# Patient Record
Sex: Female | Born: 1955 | Race: Black or African American | Hispanic: No | Smoking: Never smoker
Health system: Southern US, Community
[De-identification: ages and names within clinical notes are randomized; demographics above are authoritative.]

## PROBLEM LIST (undated history)

## (undated) DIAGNOSIS — I10 Essential (primary) hypertension: Secondary | ICD-10-CM

---

## 1979-05-12 HISTORY — PX: ABDOMINAL HYSTERECTOMY: SHX81

## 2014-11-19 ENCOUNTER — Inpatient Hospital Stay (HOSPITAL_COMMUNITY): Payer: Self-pay

## 2014-11-19 ENCOUNTER — Inpatient Hospital Stay (HOSPITAL_COMMUNITY)
Admission: AD | Admit: 2014-11-19 | Discharge: 2014-11-26 | DRG: 331 | Disposition: A | Discharge status: Home / Self Care | Payer: Self-pay | Source: Ambulatory Visit | Attending: Surgery | Admitting: Surgery

## 2014-11-19 ENCOUNTER — Encounter (HOSPITAL_COMMUNITY): Payer: Self-pay | Admitting: Radiology

## 2014-11-19 DIAGNOSIS — I10 Essential (primary) hypertension: Secondary | ICD-10-CM | POA: Diagnosis present

## 2014-11-19 DIAGNOSIS — K922 Gastrointestinal hemorrhage, unspecified: Secondary | ICD-10-CM | POA: Diagnosis present

## 2014-11-19 DIAGNOSIS — R05 Cough: Secondary | ICD-10-CM

## 2014-11-19 DIAGNOSIS — D125 Benign neoplasm of sigmoid colon: Principal | ICD-10-CM | POA: Diagnosis present

## 2014-11-19 DIAGNOSIS — R58 Hemorrhage, not elsewhere classified: Secondary | ICD-10-CM

## 2014-11-19 DIAGNOSIS — K6389 Other specified diseases of intestine: Secondary | ICD-10-CM

## 2014-11-19 DIAGNOSIS — Z880 Allergy status to penicillin: Secondary | ICD-10-CM

## 2014-11-19 DIAGNOSIS — R059 Cough, unspecified: Secondary | ICD-10-CM

## 2014-11-19 DIAGNOSIS — Z90710 Acquired absence of both cervix and uterus: Secondary | ICD-10-CM

## 2014-11-19 LAB — COMPREHENSIVE METABOLIC PANEL
ALT: 23 U/L (ref 0–35)
AST: 30 U/L (ref 0–37)
Albumin: 4.1 g/dL (ref 3.5–5.2)
Alkaline Phosphatase: 86 U/L (ref 39–117)
Anion gap: 9 (ref 5–15)
BILIRUBIN TOTAL: 0.6 mg/dL (ref 0.3–1.2)
BUN: 6 mg/dL (ref 6–23)
CO2: 29 mmol/L (ref 19–32)
Calcium: 9.8 mg/dL (ref 8.4–10.5)
Chloride: 102 mmol/L (ref 96–112)
Creatinine, Ser: 0.95 mg/dL (ref 0.50–1.10)
GFR calc Af Amer: 74 mL/min — ABNORMAL LOW (ref >90)
GFR, EST NON AFRICAN AMERICAN: 64 mL/min — AB (ref >90)
Glucose, Bld: 130 mg/dL — ABNORMAL HIGH (ref 70–99)
POTASSIUM: 3.3 mmol/L — AB (ref 3.5–5.1)
SODIUM: 140 mmol/L (ref 135–145)
Total Protein: 7.1 g/dL (ref 6.0–8.3)

## 2014-11-19 LAB — CBC WITH DIFFERENTIAL/PLATELET
BASOS ABS: 0 10*3/uL (ref 0.0–0.1)
BASOS PCT: 1 % (ref 0–1)
Eosinophils Absolute: 0.1 10*3/uL (ref 0.0–0.7)
Eosinophils Relative: 2 % (ref 0–5)
HCT: 39.2 % (ref 36.0–46.0)
HEMOGLOBIN: 12.9 g/dL (ref 12.0–15.0)
LYMPHS PCT: 53 % — AB (ref 12–46)
Lymphs Abs: 2.6 10*3/uL (ref 0.7–4.0)
MCH: 26.7 pg (ref 26.0–34.0)
MCHC: 32.9 g/dL (ref 30.0–36.0)
MCV: 81.2 fL (ref 78.0–100.0)
Monocytes Absolute: 0.3 10*3/uL (ref 0.1–1.0)
Monocytes Relative: 7 % (ref 3–12)
NEUTROS ABS: 1.8 10*3/uL (ref 1.7–7.7)
Neutrophils Relative %: 37 % — ABNORMAL LOW (ref 43–77)
PLATELETS: 234 10*3/uL (ref 150–400)
RBC: 4.83 MIL/uL (ref 3.87–5.11)
RDW: 13.6 % (ref 11.5–15.5)
WBC: 4.9 10*3/uL (ref 4.0–10.5)

## 2014-11-19 LAB — URINALYSIS, ROUTINE W REFLEX MICROSCOPIC
BILIRUBIN URINE: NEGATIVE
Glucose, UA: NEGATIVE mg/dL
Hgb urine dipstick: NEGATIVE
KETONES UR: NEGATIVE mg/dL
Leukocytes, UA: NEGATIVE
Nitrite: NEGATIVE
Protein, ur: NEGATIVE mg/dL
SPECIFIC GRAVITY, URINE: 1.003 — AB (ref 1.005–1.030)
Urobilinogen, UA: 0.2 mg/dL (ref 0.0–1.0)
pH: 7 (ref 5.0–8.0)

## 2014-11-19 LAB — PROTIME-INR
INR: 1.05 (ref 0.00–1.49)
PROTHROMBIN TIME: 13.8 s (ref 11.6–15.2)

## 2014-11-19 MED ORDER — HYDRALAZINE HCL 20 MG/ML IJ SOLN: 10.0000 mg | Freq: Four times a day (QID) | INTRAMUSCULAR | Status: DC | PRN

## 2014-11-19 MED ORDER — SODIUM CHLORIDE 0.9 % IV SOLN
INTRAVENOUS | Status: DC
Start: 2014-11-19 — End: 2014-11-22
  Administered 2014-11-19 – 2014-11-22 (×4): via INTRAVENOUS

## 2014-11-19 MED ORDER — ALPRAZOLAM 0.5 MG PO TABS
0.5000 mg | ORAL_TABLET | Freq: Two times a day (BID) | ORAL | Status: AC | PRN
  Administered 2014-11-19 – 2014-11-20 (×2): 0.5 mg via ORAL
  Filled 2014-11-19 (×2): qty 1

## 2014-11-19 MED ORDER — ACETAMINOPHEN 325 MG PO TABS: 650.0000 mg | ORAL_TABLET | Freq: Four times a day (QID) | ORAL | Status: DC | PRN

## 2014-11-19 MED ORDER — ACETAMINOPHEN 650 MG RE SUPP: 650.0000 mg | Freq: Four times a day (QID) | RECTAL | Status: DC | PRN

## 2014-11-19 MED ORDER — ONDANSETRON HCL 4 MG PO TABS: 4.0000 mg | ORAL_TABLET | Freq: Four times a day (QID) | ORAL | Status: DC | PRN

## 2014-11-19 MED ORDER — IOHEXOL 300 MG/ML  SOLN
100.0000 mL | Freq: Once | INTRAMUSCULAR | Status: AC | PRN
  Administered 2014-11-19: 100 mL via INTRAVENOUS

## 2014-11-19 MED ORDER — ONDANSETRON HCL 4 MG/2ML IJ SOLN
4.0000 mg | Freq: Four times a day (QID) | INTRAMUSCULAR | Status: DC | PRN
Start: 2014-11-19 — End: 2014-11-23

## 2014-11-19 NOTE — H&P (Signed)
[facility] History and Physical  Karina White NWG:[id number] DOB: [birth year] DOA: 11/19/2014  Referring physician: Dr.Mann PCP: No primary care provider on file.   Chief Complaint: Direct Admission from Dr.Mann's office  HPI: Karina White is a 59 y.o. female with PMH of Hypertension was diagnosed with a sigmoid mass (Tubulovillous adenoma) in February by Dr.Mann on colonoscopy. Dr.Mann was trying to get her a FU with general Surgery in follow up. Today patient noticed bright red blood per rectum followed by weakness and dizziness. She went to see Dr.Mann and was then referred to the Hospital as a Direct Admission under [facility] service. Patient denies any further episodes of bleeding today. No abd pain, no N/V. No weight loss    Review of Systems: positives bolded Constitutional: No weight loss, night sweats, Fevers, chills, fatigue.  HEENT:  No headaches, Difficulty swallowing,Tooth/dental problems,Sore throat,  No sneezing, itching, ear ache, nasal congestion, post nasal drip,  Cardio-vascular:  No chest pain, Orthopnea, PND, swelling in lower extremities, anasarca, dizziness, palpitations  GI:  No heartburn, indigestion, abdominal pain, nausea, vomiting, diarrhea, change in bowel habits, loss of appetite  Resp:  No shortness of breath with exertion or at rest. No excess mucus, no productive cough, No non-productive cough, No coughing up of blood.No change in color of mucus.No wheezing.No chest wall deformity  Skin:  no rash or lesions.  GU:  no dysuria, change in color of urine, no urgency or frequency. No flank pain.  Musculoskeletal:  No joint pain or swelling. No decreased range of motion. No back pain.  Psych:  No change in mood or affect. No depression or anxiety. No memory loss.   Past medical history -Hypertension  No past surgical history on file.   Social History:  Married, unemployed, has no tobacco, alcohol, and drug history on  file.  Allergies not on file   family history  -mother deceased due to blood clots, father deceased from old age  Prior to Admission medications   Not on File   Physical Exam: Filed Vitals:   11/19/14 1817  BP: 180/81  Pulse: 75  Temp: 98 F (36.7 C)  TempSrc: Oral  Resp: 18  SpO2: 100%    Wt Readings from Last 3 Encounters:  No data found for Wt    General:  Appears calm and comfortable, AAOx3, no distress Eyes: PERRL, normal lids, irises & conjunctiva ENT: grossly normal hearing, lips & tongue Neck: no LAD, masses or thyromegaly Cardiovascular: RRR, no m/r/g. No LE edema. Telemetry: SR, no arrhythmias  Respiratory: CTA bilaterally, no w/r/r. Normal respiratory effort. Abdomen: soft, ntnd Skin: no rash or induration seen on limited exam Musculoskeletal: grossly normal tone BUE/BLE Psychiatric: grossly normal mood and affect, speech fluent and appropriate Neurologic: grossly non-focal.          Labs on Admission:  Basic Metabolic Panel: No results for input(s): NA, K, CL, CO2, GLUCOSE, BUN, CREATININE, CALCIUM, MG, PHOS in the last 168 hours. Liver Function Tests: No results for input(s): AST, ALT, ALKPHOS, BILITOT, PROT, ALBUMIN in the last 168 hours. No results for input(s): LIPASE, AMYLASE in the last 168 hours. No results for input(s): AMMONIA in the last 168 hours. CBC: No results for input(s): WBC, NEUTROABS, HGB, HCT, MCV, PLT in the last 168 hours. Cardiac Enzymes: No results for input(s): CKTOTAL, CKMB, CKMBINDEX, TROPONINI in the last 168 hours.  BNP (last 3 results) No results for input(s): BNP in the last 8760 hours.  ProBNP (last 3 results) No results for  input(s): PROBNP in the last 8760 hours.  CBG: No results for input(s): GLUCAP in the last 168 hours.  Radiological Exams on Admission: No results found.   Assessment/Plan:  1. 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 HTN -hold HCTZ, hydralazine PRN for now  DVt proph: SCDs  Code Status: FUll Code Family Communication:none at bedside Disposition Plan: inpatient  Time spent:  [facility] Pager [phone]

## 2014-11-19 NOTE — Progress Notes (Signed)
Pt admitted to the unit at 1811. Pt mental status is alert and oriented. Pt oriented to room, staff, and call bell. Skin is intact. Full assessment charted in [facility]. Call bell within reach. Visitor guidelines reviewed w/ pt and/or family.

## 2014-11-20 ENCOUNTER — Encounter (HOSPITAL_COMMUNITY): Payer: Self-pay | Admitting: *Deleted

## 2014-11-20 DIAGNOSIS — K922 Gastrointestinal hemorrhage, unspecified: Secondary | ICD-10-CM

## 2014-11-20 LAB — PROTIME-INR
INR: 1.06 (ref 0.00–1.49)
Prothrombin Time: 13.9 seconds (ref 11.6–15.2)

## 2014-11-20 LAB — BASIC METABOLIC PANEL
Anion gap: 9 (ref 5–15)
CHLORIDE: 104 mmol/L (ref 96–112)
CO2: 29 mmol/L (ref 19–32)
CREATININE: 0.86 mg/dL (ref 0.50–1.10)
Calcium: 9.4 mg/dL (ref 8.4–10.5)
GFR calc Af Amer: 83 mL/min — ABNORMAL LOW (ref >90)
GFR, EST NON AFRICAN AMERICAN: 72 mL/min — AB (ref >90)
Glucose, Bld: 99 mg/dL (ref 70–99)
Potassium: 3.6 mmol/L (ref 3.5–5.1)
SODIUM: 142 mmol/L (ref 135–145)

## 2014-11-20 LAB — CBC
HEMATOCRIT: 39.1 % (ref 36.0–46.0)
Hemoglobin: 12.9 g/dL (ref 12.0–15.0)
MCH: 26.8 pg (ref 26.0–34.0)
MCHC: 33 g/dL (ref 30.0–36.0)
MCV: 81.3 fL (ref 78.0–100.0)
Platelets: 241 10*3/uL (ref 150–400)
RBC: 4.81 MIL/uL (ref 3.87–5.11)
RDW: 13.6 % (ref 11.5–15.5)
WBC: 4.6 10*3/uL (ref 4.0–10.5)

## 2014-11-20 MED ORDER — POTASSIUM CHLORIDE CRYS ER 20 MEQ PO TBCR
30.0000 meq | EXTENDED_RELEASE_TABLET | Freq: Once | ORAL | Status: AC
  Administered 2014-11-20: 30 meq via ORAL
  Filled 2014-11-20: qty 1

## 2014-11-20 MED ORDER — ALPRAZOLAM 0.5 MG PO TABS
0.5000 mg | ORAL_TABLET | Freq: Two times a day (BID) | ORAL | Status: DC | PRN
  Administered 2014-11-20 – 2014-11-22 (×4): 0.5 mg via ORAL
  Filled 2014-11-20 (×4): qty 1

## 2014-11-20 NOTE — Progress Notes (Signed)
Patient Demographics  Karina White, is a 59 y.o. female, DOB - 06/24/1956, ZOX:[id number]  Admit date - 11/19/2014   Admitting Physician Joseph Art, DO  Outpatient Primary MD for the patient is No primary care provider on file.  LOS - 1   No chief complaint on file.       Subjective:   Karina White today has, No headache, No chest pain, No abdominal pain - No Nausea, No new weakness tingling or numbness, No Cough - SOB.    Assessment & Plan    1. Lower GI bleed with recent history of colonic mass biosy showing tubulu villous adenoma - H&H stable, no further episode of GI bleed, Gen. Surgery seen. Likely will require hemicolectomy this admission.   2. Essential hypertension. As needed IV hydralazine.     Code Status: full  Family Communication: none  Disposition Plan: home   Procedures   CT scan abdomen and pelvis.   Consults  CCS   Medications  Scheduled Meds:  Continuous Infusions: . sodium chloride 75 mL/hr at 11/19/14 1938   PRN Meds:.acetaminophen **OR** acetaminophen, hydrALAZINE, ondansetron **OR** ondansetron (ZOFRAN) IV  DVT Prophylaxis   SCDs   Lab Results  Component Value Date   PLT 241 11/20/2014    Antibiotic   Anti-infectives    None          Objective:   Filed Vitals:   11/19/14 1817 11/19/14 2052 11/20/14 0533  BP: 180/81 175/93 154/81  Pulse: 75 67 84  Temp: 98 F (36.7 C) 98 F (36.7 C) 98.1 F (36.7 C)  TempSrc: Oral Oral Oral  Resp: 18 18 18   Height:   5\' 7"  (1.702 m)  Weight:   92 kg (202 lb 13.2 oz)  SpO2: 100% 100% 100%    Wt Readings from Last 3 Encounters:  11/20/14 92 kg (202 lb 13.2 oz)     Intake/Output Summary (Last 24 hours) at 11/20/14 1034 Last data filed at 11/19/14 2350  Gross per 24 hour    Intake    700 ml  Output    300 ml  Net    400 ml     Physical Exam  Awake Alert, Oriented X 3, No new F.N deficits, Normal affect [place].AT,PERRAL Supple Neck,No JVD, No cervical lymphadenopathy appriciated.  Symmetrical Chest wall movement, Good air movement bilaterally, CTAB RRR,No Gallops,Rubs or new Murmurs, No Parasternal Heave +ve B.Sounds, Abd Soft, No tenderness, No organomegaly appriciated, No rebound - guarding or rigidity. No Cyanosis, Clubbing or edema, No new Rash or bruise      Data Review   Micro Results No results found for this or any previous visit (from the past 240 hour(s)).  Radiology Reports Dg Chest 2 View  11/19/2014   CLINICAL DATA:  Rectal bleeding and weakness for 2 days, cough  EXAM: CHEST  2 VIEW  COMPARISON:  None  FINDINGS: Normal heart size and pulmonary vascularity.  Calcified tortuous thoracic aorta.  Lungs clear.  No pleural effusion or pneumothorax.  Osseous structures unremarkable.  IMPRESSION: No acute abnormalities.   Electronically Signed   By: Ulyses Southward M.D.   On: 11/19/2014 20:39   Ct Abdomen Pelvis W Contrast  11/20/2014   CLINICAL DATA:  Rectal  bleeding, dizziness and weakness.  EXAM: CT ABDOMEN AND PELVIS WITH CONTRAST  TECHNIQUE: Multidetector CT imaging of the abdomen and pelvis was performed using the standard protocol following bolus administration of intravenous contrast.  CONTRAST:  OMNIPAQUE IOHEXOL 300 MG/ML  SOLN  COMPARISON:  None.  FINDINGS: Lower chest: The lung bases appear clear. No pleural or pericardial effusion  Hepatobiliary: Small hyperdense structure along the dome of liver measures 4 mm, image 8/series 201. No additional liver abnormalities. The gallbladder is normal. No biliary dilatation.  Pancreas: Negative  Spleen: Negative  Adrenals/Urinary Tract: Normal adrenal glands. The right kidney appears normal. There is a cyst within the left kidney. Urinary bladder appears within normal limits.  Stomach/Bowel: Small  hiatal hernia noted. The small bowel loops have a normal course and caliber. The appendix is visualized and appears normal. No obstruction. Normal appearance of the colon. Sigmoid colon mass is difficult to visualize within the un-prepped and on opacified colon.  Vascular/Lymphatic: Calcified atherosclerotic disease involves the abdominal aorta. No aneurysm. No enlarged retroperitoneal or mesenteric adenopathy. No enlarged pelvic or inguinal lymph nodes.  Reproductive: Previous hysterectomy.  No adnexal mass.  Other: There is no ascites or focal fluid collections within the abdomen or pelvis. Small periumbilical hernia is identified which contains fat only, image 49/series 201.  Musculoskeletal: Unremarkable.  IMPRESSION: 1. No acute findings within the abdomen or pelvis. The appendix is visualized and appears normal. 2. Small hiatal hernia. 3. Small fat containing periumbilical hernia.   Electronically Signed   By: Signa Kell M.D.   On: 11/20/2014 08:07     CBC  Recent Labs Lab 11/19/14 1918 11/20/14 0516  WBC 4.9 4.6  HGB 12.9 12.9  HCT 39.2 39.1  PLT 234 241  MCV 81.2 81.3  MCH 26.7 26.8  MCHC 32.9 33.0  RDW 13.6 13.6  LYMPHSABS 2.6  --   MONOABS 0.3  --   EOSABS 0.1  --   BASOSABS 0.0  --     Chemistries   Recent Labs Lab 11/19/14 1918 11/20/14 0516  NA 140 142  K 3.3* 3.6  CL 102 104  CO2 29 29  GLUCOSE 130* 99  BUN 6 <5*  CREATININE 0.95 0.86  CALCIUM 9.8 9.4  AST 30  --   ALT 23  --   ALKPHOS 86  --   BILITOT 0.6  --    ------------------------------------------------------------------------------------------------------------------ estimated creatinine clearance is 81 mL/min (by C-G formula based on Cr of 0.86). ------------------------------------------------------------------------------------------------------------------ No results for input(s): HGBA1C in the last 72  hours. ------------------------------------------------------------------------------------------------------------------ No results for input(s): CHOL, HDL, LDLCALC, TRIG, CHOLHDL, LDLDIRECT in the last 72 hours. ------------------------------------------------------------------------------------------------------------------ No results for input(s): TSH, T4TOTAL, T3FREE, THYROIDAB in the last 72 hours.  Invalid input(s): FREET3 ------------------------------------------------------------------------------------------------------------------ No results for input(s): VITAMINB12, FOLATE, FERRITIN, TIBC, IRON, RETICCTPCT in the last 72 hours.  Coagulation profile  Recent Labs Lab 11/19/14 1918  INR 1.05    No results for input(s): DDIMER in the last 72 hours.  Cardiac Enzymes No results for input(s): CKMB, TROPONINI, MYOGLOBIN in the last 168 hours.  Invalid input(s): CK ------------------------------------------------------------------------------------------------------------------ Invalid input(s): POCBNP     Time Spent in minutes   35   Susa Raring K M.D on 11/20/2014 at 10:34 AM  Between 7am to 7pm - Pager - [phone]  After 7pm go to [email or web address] - password [facility]  [facility]   Office  [phone]

## 2014-11-20 NOTE — Progress Notes (Signed)
Patient ID: Karina White, female   DOB: 1956/07/08, 59 y.o.   MRN: [id number]

## 2014-11-20 NOTE — Consult Note (Signed)
Reason for Consult:Sigmoid colon mass Referring Physician: Myan Locatelli is an 59 y.o. female.  HPI: Karina White is a patient of Dr. Meriel Pica. She underwent colonoscopy on 11/03/14 and was found to have a sigmoid colon mass at 25 cm. Biopsies were taken and reportedly these showed tubulovillous adenoma. The colonoscopy report is not available, however, the patient's family had some pictures that Dr. Collene Mares printed out for them. I have put this into Epic. It is not clear if the mass was tattooed. The patient passed some blood per rectum yesterday. She also had some right-sided abdominal pain, and nausea. She was admitted to the medical service for lower GI bleeding. She has had no bowel movement since admission. Hemoglobin has been 12.9-12.9. She reports mild nausea but that the abdominal discomfort has resolved.  History reviewed. No pertinent past medical history.  Past surgical history: Umbilical hernia repair, abdominal hysterectomy via Pfannenstiel  History reviewed. No pertinent family history.  Social History:  reports that she has never smoked. She does not have any smokeless tobacco history on file. Her alcohol and drug histories are not on file.  Allergies:  Allergies  Allergen Reactions  . Penicillins Hives and Itching    Medications:  Prior to Admission:  No prescriptions prior to admission    Results for orders placed or performed during the hospital encounter of 11/19/14 (from the past 48 hour(s))  CBC with Differential/Platelet     Status: Abnormal   Collection Time: 11/19/14  7:18 PM  Result Value Ref Range   WBC 4.9 4.0 - 10.5 K/uL   RBC 4.83 3.87 - 5.11 MIL/uL   Hemoglobin 12.9 12.0 - 15.0 g/dL   HCT 39.2 36.0 - 46.0 %   MCV 81.2 78.0 - 100.0 fL   MCH 26.7 26.0 - 34.0 pg   MCHC 32.9 30.0 - 36.0 g/dL   RDW 13.6 11.5 - 15.5 %   Platelets 234 150 - 400 K/uL   Neutrophils Relative % 37 (L) 43 - 77 %   Neutro Abs 1.8 1.7 - 7.7 K/uL   Lymphocytes  Relative 53 (H) 12 - 46 %   Lymphs Abs 2.6 0.7 - 4.0 K/uL   Monocytes Relative 7 3 - 12 %   Monocytes Absolute 0.3 0.1 - 1.0 K/uL   Eosinophils Relative 2 0 - 5 %   Eosinophils Absolute 0.1 0.0 - 0.7 K/uL   Basophils Relative 1 0 - 1 %   Basophils Absolute 0.0 0.0 - 0.1 K/uL  Comprehensive metabolic panel     Status: Abnormal   Collection Time: 11/19/14  7:18 PM  Result Value Ref Range   Sodium 140 135 - 145 mmol/L   Potassium 3.3 (L) 3.5 - 5.1 mmol/L   Chloride 102 96 - 112 mmol/L   CO2 29 19 - 32 mmol/L   Glucose, Bld 130 (H) 70 - 99 mg/dL   BUN 6 6 - 23 mg/dL   Creatinine, Ser 0.95 0.50 - 1.10 mg/dL   Calcium 9.8 8.4 - 10.5 mg/dL   Total Protein 7.1 6.0 - 8.3 g/dL   Albumin 4.1 3.5 - 5.2 g/dL   AST 30 0 - 37 U/L   ALT 23 0 - 35 U/L   Alkaline Phosphatase 86 39 - 117 U/L   Total Bilirubin 0.6 0.3 - 1.2 mg/dL   GFR calc non Af Amer 64 (L) >90 mL/min   GFR calc Af Amer 74 (L) >90 mL/min    Comment: (NOTE) The eGFR  has been calculated using the CKD EPI equation. This calculation has not been validated in all clinical situations. eGFR's persistently <90 mL/min signify possible Chronic Kidney Disease.    Anion gap 9 5 - 15  Protime-INR     Status: None   Collection Time: 11/19/14  7:18 PM  Result Value Ref Range   Prothrombin Time 13.8 11.6 - 15.2 seconds   INR 1.05 0.00 - 1.49  Urinalysis, Routine w reflex microscopic     Status: Abnormal   Collection Time: 11/19/14 11:11 PM  Result Value Ref Range   Color, Urine YELLOW YELLOW   APPearance CLEAR CLEAR   Specific Gravity, Urine 1.003 (L) 1.005 - 1.030   pH 7.0 5.0 - 8.0   Glucose, UA NEGATIVE NEGATIVE mg/dL   Hgb urine dipstick NEGATIVE NEGATIVE   Bilirubin Urine NEGATIVE NEGATIVE   Ketones, ur NEGATIVE NEGATIVE mg/dL   Protein, ur NEGATIVE NEGATIVE mg/dL   Urobilinogen, UA 0.2 0.0 - 1.0 mg/dL   Nitrite NEGATIVE NEGATIVE   Leukocytes, UA NEGATIVE NEGATIVE    Comment: MICROSCOPIC NOT DONE ON URINES WITH NEGATIVE  PROTEIN, BLOOD, LEUKOCYTES, NITRITE, OR GLUCOSE <1000 mg/dL.  CBC     Status: None   Collection Time: 11/20/14  5:16 AM  Result Value Ref Range   WBC 4.6 4.0 - 10.5 K/uL   RBC 4.81 3.87 - 5.11 MIL/uL   Hemoglobin 12.9 12.0 - 15.0 g/dL   HCT 39.1 36.0 - 46.0 %   MCV 81.3 78.0 - 100.0 fL   MCH 26.8 26.0 - 34.0 pg   MCHC 33.0 30.0 - 36.0 g/dL   RDW 13.6 11.5 - 15.5 %   Platelets 241 150 - 400 K/uL  Basic metabolic panel     Status: Abnormal (Preliminary result)   Collection Time: 11/20/14  5:16 AM  Result Value Ref Range   Sodium 142 135 - 145 mmol/L   Potassium 3.6 3.5 - 5.1 mmol/L   Chloride 104 96 - 112 mmol/L   CO2 29 19 - 32 mmol/L   Glucose, Bld 99 70 - 99 mg/dL   BUN PENDING 6 - 23 mg/dL   Creatinine, Ser 0.86 0.50 - 1.10 mg/dL   Calcium 9.4 8.4 - 10.5 mg/dL   GFR calc non Af Amer 72 (L) >90 mL/min   GFR calc Af Amer 83 (L) >90 mL/min    Comment: (NOTE) The eGFR has been calculated using the CKD EPI equation. This calculation has not been validated in all clinical situations. eGFR's persistently <90 mL/min signify possible Chronic Kidney Disease.    Anion gap 9 5 - 15    Dg Chest 2 View  11/19/2014   CLINICAL DATA:  Rectal bleeding and weakness for 2 days, cough  EXAM: CHEST  2 VIEW  COMPARISON:  None  FINDINGS: Normal heart size and pulmonary vascularity.  Calcified tortuous thoracic aorta.  Lungs clear.  No pleural effusion or pneumothorax.  Osseous structures unremarkable.  IMPRESSION: No acute abnormalities.   Electronically Signed   By: Lavonia Dana M.D.   On: 11/19/2014 20:39   Ct Abdomen Pelvis W Contrast  11/20/2014   CLINICAL DATA:  Rectal bleeding, dizziness and weakness.  EXAM: CT ABDOMEN AND PELVIS WITH CONTRAST  TECHNIQUE: Multidetector CT imaging of the abdomen and pelvis was performed using the standard protocol following bolus administration of intravenous contrast.  CONTRAST:  141m OMNIPAQUE IOHEXOL 300 MG/ML  SOLN  COMPARISON:  None.  FINDINGS: Lower  chest: The lung bases appear clear. No  pleural or pericardial effusion  Hepatobiliary: Small hyperdense structure along the dome of liver measures 4 mm, image 8/series 201. No additional liver abnormalities. The gallbladder is normal. No biliary dilatation.  Pancreas: Negative  Spleen: Negative  Adrenals/Urinary Tract: Normal adrenal glands. The right kidney appears normal. There is a cyst within the left kidney. Urinary bladder appears within normal limits.  Stomach/Bowel: Small hiatal hernia noted. The small bowel loops have a normal course and caliber. The appendix is visualized and appears normal. No obstruction. Normal appearance of the colon. Sigmoid colon mass is difficult to visualize within the un-prepped and on opacified colon.  Vascular/Lymphatic: Calcified atherosclerotic disease involves the abdominal aorta. No aneurysm. No enlarged retroperitoneal or mesenteric adenopathy. No enlarged pelvic or inguinal lymph nodes.  Reproductive: Previous hysterectomy.  No adnexal mass.  Other: There is no ascites or focal fluid collections within the abdomen or pelvis. Small periumbilical hernia is identified which contains fat only, image 49/series 201.  Musculoskeletal: Unremarkable.  IMPRESSION: 1. No acute findings within the abdomen or pelvis. The appendix is visualized and appears normal. 2. Small hiatal hernia. 3. Small fat containing periumbilical hernia.   Electronically Signed   By: Kerby Moors M.D.   On: 11/20/2014 08:07    Review of Systems  Constitutional: Negative for fever and chills.  HENT: Negative.   Eyes: Negative.   Respiratory: Negative for cough and hemoptysis.   Cardiovascular: Negative for chest pain and palpitations.  Gastrointestinal: Positive for nausea, abdominal pain and blood in stool.  Genitourinary: Negative.   Musculoskeletal: Negative.   Skin: Negative.   Neurological: Negative.   Endo/Heme/Allergies: Negative.   Psychiatric/Behavioral: Negative.    Blood pressure  154/81, pulse 84, temperature 98.1 F (36.7 C), temperature source Oral, resp. rate 18, height 5' 7"  (1.702 m), weight 202 lb 13.2 oz (92 kg), SpO2 100 %. Physical Exam  Constitutional: She is oriented to person, place, and time. She appears well-developed and well-nourished. No distress.  HENT:  Head: Normocephalic and atraumatic.  Right Ear: External ear normal.  Left Ear: External ear normal.  Nose: Nose normal.  Mouth/Throat: Oropharynx is clear and moist.  Eyes: EOM are normal. Pupils are equal, round, and reactive to light. Right eye exhibits no discharge. Left eye exhibits no discharge. No scleral icterus.  Wears glasses  Neck: Normal range of motion. No tracheal deviation present.  Cardiovascular: Normal rate, regular rhythm, normal heart sounds and intact distal pulses.   Respiratory: Effort normal and breath sounds normal. No stridor. No respiratory distress. She has no wheezes. She has no rales.  GI: Soft. She exhibits no distension. There is no tenderness. There is no rebound and no guarding.  Small umbilical hernia is reduced  Musculoskeletal: Normal range of motion. She exhibits no edema or tenderness.  Neurological: She is alert and oriented to person, place, and time. She exhibits normal muscle tone.  Skin: Skin is warm and dry.  Psychiatric: She has a normal mood and affect.    Assessment/Plan: Lower GI bleed, one episode. Sigmoid colon mass which is reportedly a tubulovillous adenoma at 25 cm. Recommend: Continue clear liquids. Please obtain Dr. Lorie Apley complete record. It is possible this tumor may need to be localized with repeat colonoscopy and tattooing depending on what her report states. Once we have complete information, we will plan bowel prep and sigmoid colectomy this admission. I discussed this plan with Karina White and her family member in detail. I recommended an additional progress note that included the colonoscopy pictures.  Karina White E 11/20/2014, 9:41 AM

## 2014-11-21 LAB — HEMOGLOBIN AND HEMATOCRIT, BLOOD
HEMATOCRIT: 37.8 % (ref 36.0–46.0)
HEMOGLOBIN: 12.2 g/dL (ref 12.0–15.0)

## 2014-11-21 MED ORDER — MAGNESIUM SULFATE IN D5W 10-5 MG/ML-% IV SOLN
1.0000 g | Freq: Once | INTRAVENOUS | Status: AC
  Administered 2014-11-21: 1 g via INTRAVENOUS
  Filled 2014-11-21: qty 100

## 2014-11-21 NOTE — Progress Notes (Signed)
Records from Dr. Lorie Apley office in pt's shadow chart. Candiss Norse MD aware.

## 2014-11-21 NOTE — Progress Notes (Signed)
UR Completed.  [phone]  

## 2014-11-21 NOTE — Progress Notes (Signed)
Patient Demographics  Karina White, is a 59 y.o. female, DOB - 28-Mar-1956, HYQ:[id number]  Admit date - 11/19/2014   Admitting Physician Joseph Art, DO  Outpatient Primary MD for the patient is No primary care provider on file.  LOS - 2   No chief complaint on file.       Subjective:   Karina White today has, No headache, No chest pain, No abdominal pain - No Nausea, No new weakness tingling or numbness, No Cough - SOB.    Assessment & Plan    1. Lower GI bleed with recent history of colonic mass biosy showing tubulu villous adenoma - H&H stable, no further episode of GI bleed, Gen. Surgery seeing the patient. Likely will require hemicolectomy this admission. Records from GI office of Dr Loreta Ave requested.   2. Essential hypertension. As needed IV hydralazine.     Code Status: full  Family Communication: none  Disposition Plan: home   Procedures   CT scan abdomen and pelvis.   Consults  CCS   Medications  Scheduled Meds:  Continuous Infusions: . sodium chloride 75 mL/hr at 11/21/14 0027   PRN Meds:.acetaminophen **OR** acetaminophen, ALPRAZolam, hydrALAZINE, ondansetron **OR** ondansetron (ZOFRAN) IV  DVT Prophylaxis   SCDs   Lab Results  Component Value Date   PLT 241 11/20/2014    Antibiotic   Anti-infectives    None          Objective:   Filed Vitals:   11/20/14 0533 11/20/14 1340 11/20/14 2138 11/21/14 0535  BP: 154/81 145/73 155/85 126/72  Pulse: 84 80 70 55  Temp: 98.1 F (36.7 C) 98.6 F (37 C) 97.8 F (36.6 C) 97.5 F (36.4 C)  TempSrc: Oral Oral Oral Oral  Resp: 18 16 16 17   Height: 5\' 7"  (1.702 m)     Weight: 92 kg (202 lb 13.2 oz)     SpO2: 100% 99% 100% 99%    Wt Readings from Last 3 Encounters:  11/20/14 92 kg (202 lb  13.2 oz)     Intake/Output Summary (Last 24 hours) at 11/21/14 1019 Last data filed at 11/21/14 1005  Gross per 24 hour  Intake 2446.25 ml  Output      0 ml  Net 2446.25 ml     Physical Exam  Awake Alert, Oriented X 3, No new F.N deficits, Normal affect [place].AT,PERRAL Supple Neck,No JVD, No cervical lymphadenopathy appriciated.  Symmetrical Chest wall movement, Good air movement bilaterally, CTAB RRR,No Gallops,Rubs or new Murmurs, No Parasternal Heave +ve B.Sounds, Abd Soft, No tenderness, No organomegaly appriciated, No rebound - guarding or rigidity. No Cyanosis, Clubbing or edema, No new Rash or bruise      Data Review   Micro Results No results found for this or any previous visit (from the past 240 hour(s)).  Radiology Reports Dg Chest 2 View  11/19/2014   CLINICAL DATA:  Rectal bleeding and weakness for 2 days, cough  EXAM: CHEST  2 VIEW  COMPARISON:  None  FINDINGS: Normal heart size and pulmonary vascularity.  Calcified tortuous thoracic aorta.  Lungs clear.  No pleural effusion or pneumothorax.  Osseous structures unremarkable.  IMPRESSION: No acute abnormalities.   Electronically Signed   By: Angelyn Punt.D.  On: 11/19/2014 20:39   Ct Abdomen Pelvis W Contrast  11/20/2014   CLINICAL DATA:  Rectal bleeding, dizziness and weakness.  EXAM: CT ABDOMEN AND PELVIS WITH CONTRAST  TECHNIQUE: Multidetector CT imaging of the abdomen and pelvis was performed using the standard protocol following bolus administration of intravenous contrast.  CONTRAST:  OMNIPAQUE IOHEXOL 300 MG/ML  SOLN  COMPARISON:  None.  FINDINGS: Lower chest: The lung bases appear clear. No pleural or pericardial effusion  Hepatobiliary: Small hyperdense structure along the dome of liver measures 4 mm, image 8/series 201. No additional liver abnormalities. The gallbladder is normal. No biliary dilatation.  Pancreas: Negative  Spleen: Negative  Adrenals/Urinary Tract: Normal adrenal glands. The right kidney  appears normal. There is a cyst within the left kidney. Urinary bladder appears within normal limits.  Stomach/Bowel: Small hiatal hernia noted. The small bowel loops have a normal course and caliber. The appendix is visualized and appears normal. No obstruction. Normal appearance of the colon. Sigmoid colon mass is difficult to visualize within the un-prepped and on opacified colon.  Vascular/Lymphatic: Calcified atherosclerotic disease involves the abdominal aorta. No aneurysm. No enlarged retroperitoneal or mesenteric adenopathy. No enlarged pelvic or inguinal lymph nodes.  Reproductive: Previous hysterectomy.  No adnexal mass.  Other: There is no ascites or focal fluid collections within the abdomen or pelvis. Small periumbilical hernia is identified which contains fat only, image 49/series 201.  Musculoskeletal: Unremarkable.  IMPRESSION: 1. No acute findings within the abdomen or pelvis. The appendix is visualized and appears normal. 2. Small hiatal hernia. 3. Small fat containing periumbilical hernia.   Electronically Signed   By: Signa Kell M.D.   On: 11/20/2014 08:07     CBC  Recent Labs Lab 11/19/14 1918 11/20/14 0516 11/21/14 0610  WBC 4.9 4.6  --   HGB 12.9 12.9 12.2  HCT 39.2 39.1 37.8  PLT 234 241  --   MCV 81.2 81.3  --   MCH 26.7 26.8  --   MCHC 32.9 33.0  --   RDW 13.6 13.6  --   LYMPHSABS 2.6  --   --   MONOABS 0.3  --   --   EOSABS 0.1  --   --   BASOSABS 0.0  --   --     Chemistries   Recent Labs Lab 11/19/14 1918 11/20/14 0516  NA 140 142  K 3.3* 3.6  CL 102 104  CO2 29 29  GLUCOSE 130* 99  BUN 6 <5*  CREATININE 0.95 0.86  CALCIUM 9.8 9.4  AST 30  --   ALT 23  --   ALKPHOS 86  --   BILITOT 0.6  --    ------------------------------------------------------------------------------------------------------------------ estimated creatinine clearance is 81 mL/min (by C-G formula based on Cr of  0.86). ------------------------------------------------------------------------------------------------------------------ No results for input(s): HGBA1C in the last 72 hours. ------------------------------------------------------------------------------------------------------------------ No results for input(s): CHOL, HDL, LDLCALC, TRIG, CHOLHDL, LDLDIRECT in the last 72 hours. ------------------------------------------------------------------------------------------------------------------ No results for input(s): TSH, T4TOTAL, T3FREE, THYROIDAB in the last 72 hours.  Invalid input(s): FREET3 ------------------------------------------------------------------------------------------------------------------ No results for input(s): VITAMINB12, FOLATE, FERRITIN, TIBC, IRON, RETICCTPCT in the last 72 hours.  Coagulation profile  Recent Labs Lab 11/19/14 1918 11/20/14 1127  INR 1.05 1.06    No results for input(s): DDIMER in the last 72 hours.  Cardiac Enzymes No results for input(s): CKMB, TROPONINI, MYOGLOBIN in the last 168 hours.  Invalid input(s): CK ------------------------------------------------------------------------------------------------------------------ Invalid input(s): POCBNP     Time Spent in minutes  35   Leroy Sea M.D on 11/21/2014 at 10:19 AM  Between 7am to 7pm - Pager - [phone]  After 7pm go to [email or web address] - password [facility]  [facility]   Office  [phone]

## 2014-11-21 NOTE — Progress Notes (Addendum)
Subjective: Pt stable.  Having bowel movements.    Objective: Vital signs in last 24 hours: Temp:  [97.5 F (36.4 C)-98.6 F (37 C)] 97.5 F (36.4 C) (03/13 0535) Pulse Rate:  [55-80] 55 (03/13 0535) Resp:  [16-17] 17 (03/13 0535) BP: (126-155)/(72-85) 126/72 mmHg (03/13 0535) SpO2:  [99 %-100 %] 99 % (03/13 0535) Last BM Date: 11/20/14  Intake/Output from previous day: 03/12 0701 - 03/13 0700 In: 1785 [P.O.:960; I.V.:825] Out: -  Intake/Output this shift: Total I/O In: 180 [P.O.:180] Out: -   General appearance: alert, cooperative and no distress Resp: breathing comfortably Cardio: regular rate and rhythm GI: soft  Lab Results:   Recent Labs  11/19/14 1918 11/20/14 0516 11/21/14 0610  WBC 4.9 4.6  --   HGB 12.9 12.9 12.2  HCT 39.2 39.1 37.8  PLT 234 241  --    BMET  Recent Labs  11/19/14 1918 11/20/14 0516  NA 140 142  K 3.3* 3.6  CL 102 104  CO2 29 29  GLUCOSE 130* 99  BUN 6 <5*  CREATININE 0.95 0.86  CALCIUM 9.8 9.4   PT/INR  Recent Labs  11/19/14 1918 11/20/14 1127  LABPROT 13.8 13.9  INR 1.05 1.06   ABG No results for input(s): PHART, HCO3 in the last 72 hours.  Invalid input(s): PCO2, PO2  Studies/Results: Dg Chest 2 View  11/19/2014   CLINICAL DATA:  Rectal bleeding and weakness for 2 days, cough  EXAM: CHEST  2 VIEW  COMPARISON:  None  FINDINGS: Normal heart size and pulmonary vascularity.  Calcified tortuous thoracic aorta.  Lungs clear.  No pleural effusion or pneumothorax.  Osseous structures unremarkable.  IMPRESSION: No acute abnormalities.   Electronically Signed   By: Lavonia Dana M.D.   On: 11/19/2014 20:39   Ct Abdomen Pelvis W Contrast  11/20/2014   CLINICAL DATA:  Rectal bleeding, dizziness and weakness.  EXAM: CT ABDOMEN AND PELVIS WITH CONTRAST  TECHNIQUE: Multidetector CT imaging of the abdomen and pelvis was performed using the standard protocol following bolus administration of intravenous contrast.  CONTRAST:   165mL OMNIPAQUE IOHEXOL 300 MG/ML  SOLN  COMPARISON:  None.  FINDINGS: Lower chest: The lung bases appear clear. No pleural or pericardial effusion  Hepatobiliary: Small hyperdense structure along the dome of liver measures 4 mm, image 8/series 201. No additional liver abnormalities. The gallbladder is normal. No biliary dilatation.  Pancreas: Negative  Spleen: Negative  Adrenals/Urinary Tract: Normal adrenal glands. The right kidney appears normal. There is a cyst within the left kidney. Urinary bladder appears within normal limits.  Stomach/Bowel: Small hiatal hernia noted. The small bowel loops have a normal course and caliber. The appendix is visualized and appears normal. No obstruction. Normal appearance of the colon. Sigmoid colon mass is difficult to visualize within the un-prepped and on opacified colon.  Vascular/Lymphatic: Calcified atherosclerotic disease involves the abdominal aorta. No aneurysm. No enlarged retroperitoneal or mesenteric adenopathy. No enlarged pelvic or inguinal lymph nodes.  Reproductive: Previous hysterectomy.  No adnexal mass.  Other: There is no ascites or focal fluid collections within the abdomen or pelvis. Small periumbilical hernia is identified which contains fat only, image 49/series 201.  Musculoskeletal: Unremarkable.  IMPRESSION: 1. No acute findings within the abdomen or pelvis. The appendix is visualized and appears normal. 2. Small hiatal hernia. 3. Small fat containing periumbilical hernia.   Electronically Signed   By: Kerby Moors M.D.   On: 11/20/2014 08:07    Anti-infectives: Anti-infectives  None      Assessment/Plan: s/p * No surgery found *  Villous adenoma of colon with bleeding.   Need colonoscopy report  Will most likely need tattoo for localization. Will keep on clears with plan for colectomy while in house.   Dr. Ninfa Linden to take over tomorrow.     LOS: 2 days    [facility] 11/21/2014

## 2014-11-22 DIAGNOSIS — K921 Melena: Secondary | ICD-10-CM

## 2014-11-22 LAB — BASIC METABOLIC PANEL
Anion gap: 7 (ref 5–15)
BUN: <5 mg/dL — ABNORMAL LOW (ref 6–23)
CALCIUM: 8.8 mg/dL (ref 8.4–10.5)
CO2: 26 mmol/L (ref 19–32)
Chloride: 109 mmol/L (ref 96–112)
Creatinine, Ser: 0.84 mg/dL (ref 0.50–1.10)
GFR calc Af Amer: 86 mL/min — ABNORMAL LOW (ref >90)
GFR calc non Af Amer: 74 mL/min — ABNORMAL LOW (ref >90)
Glucose, Bld: 83 mg/dL (ref 70–99)
Potassium: 3.7 mmol/L (ref 3.5–5.1)
SODIUM: 142 mmol/L (ref 135–145)

## 2014-11-22 LAB — CBC
HEMATOCRIT: 37.8 % (ref 36.0–46.0)
HEMOGLOBIN: 12.4 g/dL (ref 12.0–15.0)
MCH: 26.8 pg (ref 26.0–34.0)
MCHC: 32.8 g/dL (ref 30.0–36.0)
MCV: 81.6 fL (ref 78.0–100.0)
Platelets: 222 10*3/uL (ref 150–400)
RBC: 4.63 MIL/uL (ref 3.87–5.11)
RDW: 13.7 % (ref 11.5–15.5)
WBC: 4.4 10*3/uL (ref 4.0–10.5)

## 2014-11-22 LAB — CEA: CEA: 3.1 ng/mL (ref 0.0–4.7)

## 2014-11-22 MED ORDER — CHLORHEXIDINE GLUCONATE 4 % EX LIQD
60.0000 mL | Freq: Once | CUTANEOUS | Status: AC
  Administered 2014-11-23: 4 via TOPICAL
  Filled 2014-11-22: qty 60

## 2014-11-22 MED ORDER — CLINDAMYCIN PHOSPHATE 900 MG/50ML IV SOLN
900.0000 mg | INTRAVENOUS | Status: AC
  Administered 2014-11-23: 900 mg via INTRAVENOUS
  Filled 2014-11-22: qty 50

## 2014-11-22 MED ORDER — CARVEDILOL 3.125 MG PO TABS
3.1250 mg | ORAL_TABLET | Freq: Two times a day (BID) | ORAL | Status: DC
  Administered 2014-11-22 – 2014-11-23 (×3): 3.125 mg via ORAL
  Filled 2014-11-22 (×5): qty 1

## 2014-11-22 MED ORDER — PEG 3350-KCL-NA BICARB-NACL 420 G PO SOLR
4000.0000 mL | Freq: Once | ORAL | Status: AC
  Administered 2014-11-22: 4000 mL via ORAL
  Filled 2014-11-22: qty 4000

## 2014-11-22 MED ORDER — CLINDAMYCIN PHOSPHATE 900 MG/50ML IV SOLN
900.0000 mg | INTRAVENOUS | Status: DC
  Filled 2014-11-22: qty 50

## 2014-11-22 MED ORDER — CHLORHEXIDINE GLUCONATE 4 % EX LIQD
60.0000 mL | Freq: Once | CUTANEOUS | Status: AC
  Administered 2014-11-22: 4 via TOPICAL
  Filled 2014-11-22 (×2): qty 60

## 2014-11-22 MED ORDER — GENTAMICIN SULFATE 40 MG/ML IJ SOLN
5.0000 mg/kg | INTRAVENOUS | Status: DC
  Filled 2014-11-22: qty 11.5

## 2014-11-22 MED ORDER — DEXTROSE 5 % IV SOLN
5.0000 mg/kg | INTRAVENOUS | Status: AC
  Administered 2014-11-23: 460 mg via INTRAVENOUS
  Filled 2014-11-22: qty 11.5

## 2014-11-22 MED ORDER — ERYTHROMYCIN BASE 250 MG PO TABS
1000.0000 mg | ORAL_TABLET | ORAL | Status: AC
  Administered 2014-11-22 (×3): 1000 mg via ORAL
  Filled 2014-11-22 (×3): qty 4

## 2014-11-22 MED ORDER — NEOMYCIN SULFATE 500 MG PO TABS
1000.0000 mg | ORAL_TABLET | ORAL | Status: AC
  Administered 2014-11-22 (×3): 1000 mg via ORAL
  Filled 2014-11-22 (×3): qty 2

## 2014-11-22 MED ORDER — ALVIMOPAN 12 MG PO CAPS
12.0000 mg | ORAL_CAPSULE | Freq: Once | ORAL | Status: DC
  Filled 2014-11-22: qty 1

## 2014-11-22 NOTE — Progress Notes (Signed)
Patient ID: Karina White, female   DOB: 09/05/1956, 59 y.o.   MRN: [id number]    Subjective: Pt with lots of questions today about surgery.  She is otherwise feeling ok.  No further bloody BMs  Objective: Vital signs in last 24 hours: Temp:  [97.9 F (36.6 C)-98.4 F (36.9 C)] 98.4 F (36.9 C) (03/14 0457) Pulse Rate:  [71] 71 (03/13 1425) Resp:  [16] 16 (03/14 0457) BP: (144-168)/(70-90) 153/76 mmHg (03/14 0457) SpO2:  [99 %-100 %] 100 % (03/14 0457) Last BM Date: 11/20/14  Intake/Output from previous day: 03/13 0701 - 03/14 0700 In: 1720 [P.O.:660; I.V.:960; IV Piggyback:100] Out: -  Intake/Output this shift:    PE: Abd: soft, NT, ND, +BS Heart: regular Lungs: CTAB  Lab Results:   Recent Labs  11/20/14 0516 11/21/14 0610 11/22/14 0711  WBC 4.6  --  4.4  HGB 12.9 12.2 12.4  HCT 39.1 37.8 37.8  PLT 241  --  222   BMET  Recent Labs  11/19/14 1918 11/20/14 0516  NA 140 142  K 3.3* 3.6  CL 102 104  CO2 29 29  GLUCOSE 130* 99  BUN 6 <5*  CREATININE 0.95 0.86  CALCIUM 9.8 9.4   PT/INR  Recent Labs  11/19/14 1918 11/20/14 1127  LABPROT 13.8 13.9  INR 1.05 1.06   CMP     Component Value Date/Time   NA 142 11/20/2014 0516   K 3.6 11/20/2014 0516   CL 104 11/20/2014 0516   CO2 29 11/20/2014 0516   GLUCOSE 99 11/20/2014 0516   BUN <5* 11/20/2014 0516   CREATININE 0.86 11/20/2014 0516   CALCIUM 9.4 11/20/2014 0516   PROT 7.1 11/19/2014 1918   ALBUMIN 4.1 11/19/2014 1918   AST 30 11/19/2014 1918   ALT 23 11/19/2014 1918   ALKPHOS 86 11/19/2014 1918   BILITOT 0.6 11/19/2014 1918   GFRNONAA 72* 11/20/2014 0516   GFRAA 83* 11/20/2014 0516   Lipase  No results found for: LIPASE     Studies/Results: No results found.  Anti-infectives: Anti-infectives    None       Assessment/Plan  1. TVA of sigmoid colon, nonobstructing -clear liquids today, NPO p MN -entereg on call to OR tomorrow -bowel prep and abx bowel prep to be given  today for surgery tomorrow -extensive discussion had with the patient and her daughter regarding surgery and possible complications and expected outcomes with both laparoscopic and open surgery.  The patient is very hesitant to have laparoscopic surgery due to a complication that her sister had.  She is still trying to decide which method she would prefer.  In the meantime, she will be prepped with the anticipation of surgery tomorrow.    LOS: 3 days    Dajia Gunnels E 11/22/2014, 9:01 AM Pager: [phone]

## 2014-11-22 NOTE — Progress Notes (Signed)
Patient Demographics  Karina White, is a 59 y.o. female, DOB - 1955/11/02, ZOX:[id number]  Admit date - 11/19/2014   Admitting Physician Joseph Art, DO  Outpatient Primary MD for the patient is No primary care provider on file.  LOS - 3   No chief complaint on file.       Subjective:   Karina White today has, No headache, No chest pain, No abdominal pain - No Nausea, No new weakness tingling or numbness, No Cough - SOB.    Assessment & Plan    1. Lower GI bleed with recent history of colonic mass biosy showing tubulu villous adenoma - H&H stable, no further episode of GI bleed, Gen. Surgery seeing the patient. Scheduled for hemicolectomy on 11/23/2014, extensive questions were answered by me and Gen. surgery bedside on 11/22/2014.   2. Essential hypertension. Place on low-dose Coreg along with As needed IV hydralazine.     Code Status: full  Family Communication: daughter bedside  Disposition Plan: home   Procedures   CT scan abdomen and pelvis.   Consults  CCS   Medications  Scheduled Meds:  Continuous Infusions: . sodium chloride 75 mL/hr at 11/22/14 0442   PRN Meds:.acetaminophen **OR** acetaminophen, ALPRAZolam, hydrALAZINE, ondansetron **OR** ondansetron (ZOFRAN) IV  DVT Prophylaxis   SCDs   Lab Results  Component Value Date   PLT 222 11/22/2014    Antibiotic   Anti-infectives    None          Objective:   Filed Vitals:   11/21/14 1425 11/21/14 1553 11/21/14 2219 11/22/14 0457  BP: 168/90 144/70 151/79 153/76  Pulse: 71     Temp: 98.2 F (36.8 C)  97.9 F (36.6 C) 98.4 F (36.9 C)  TempSrc: Oral  Oral Oral  Resp: 16  16 16   Height:      Weight:      SpO2: 100%  99% 100%    Wt Readings from Last 3 Encounters:  11/20/14 92 kg  (202 lb 13.2 oz)     Intake/Output Summary (Last 24 hours) at 11/22/14 0954 Last data filed at 11/21/14 1748  Gross per 24 hour  Intake   1540 ml  Output      0 ml  Net   1540 ml     Physical Exam  Awake Alert, Oriented X 3, No new F.N deficits, Normal affect [place].AT,PERRAL Supple Neck,No JVD, No cervical lymphadenopathy appriciated.  Symmetrical Chest wall movement, Good air movement bilaterally, CTAB RRR,No Gallops,Rubs or new Murmurs, No Parasternal Heave +ve B.Sounds, Abd Soft, No tenderness, No organomegaly appriciated, No rebound - guarding or rigidity. No Cyanosis, Clubbing or edema, No new Rash or bruise      Data Review   Micro Results No results found for this or any previous visit (from the past 240 hour(s)).  Radiology Reports Dg Chest 2 View  11/19/2014   CLINICAL DATA:  Rectal bleeding and weakness for 2 days, cough  EXAM: CHEST  2 VIEW  COMPARISON:  None  FINDINGS: Normal heart size and pulmonary vascularity.  Calcified tortuous thoracic aorta.  Lungs clear.  No pleural effusion or pneumothorax.  Osseous structures unremarkable.  IMPRESSION: No acute abnormalities.   Electronically Signed   By: Loraine Leriche  Tyron Russell M.D.   On: 11/19/2014 20:39   Ct Abdomen Pelvis W Contrast  11/20/2014   CLINICAL DATA:  Rectal bleeding, dizziness and weakness.  EXAM: CT ABDOMEN AND PELVIS WITH CONTRAST  TECHNIQUE: Multidetector CT imaging of the abdomen and pelvis was performed using the standard protocol following bolus administration of intravenous contrast.  CONTRAST:  OMNIPAQUE IOHEXOL 300 MG/ML  SOLN  COMPARISON:  None.  FINDINGS: Lower chest: The lung bases appear clear. No pleural or pericardial effusion  Hepatobiliary: Small hyperdense structure along the dome of liver measures 4 mm, image 8/series 201. No additional liver abnormalities. The gallbladder is normal. No biliary dilatation.  Pancreas: Negative  Spleen: Negative  Adrenals/Urinary Tract: Normal adrenal glands. The right  kidney appears normal. There is a cyst within the left kidney. Urinary bladder appears within normal limits.  Stomach/Bowel: Small hiatal hernia noted. The small bowel loops have a normal course and caliber. The appendix is visualized and appears normal. No obstruction. Normal appearance of the colon. Sigmoid colon mass is difficult to visualize within the un-prepped and on opacified colon.  Vascular/Lymphatic: Calcified atherosclerotic disease involves the abdominal aorta. No aneurysm. No enlarged retroperitoneal or mesenteric adenopathy. No enlarged pelvic or inguinal lymph nodes.  Reproductive: Previous hysterectomy.  No adnexal mass.  Other: There is no ascites or focal fluid collections within the abdomen or pelvis. Small periumbilical hernia is identified which contains fat only, image 49/series 201.  Musculoskeletal: Unremarkable.  IMPRESSION: 1. No acute findings within the abdomen or pelvis. The appendix is visualized and appears normal. 2. Small hiatal hernia. 3. Small fat containing periumbilical hernia.   Electronically Signed   By: Signa Kell M.D.   On: 11/20/2014 08:07     CBC  Recent Labs Lab 11/19/14 1918 11/20/14 0516 11/21/14 0610 11/22/14 0711  WBC 4.9 4.6  --  4.4  HGB 12.9 12.9 12.2 12.4  HCT 39.2 39.1 37.8 37.8  PLT 234 241  --  222  MCV 81.2 81.3  --  81.6  MCH 26.7 26.8  --  26.8  MCHC 32.9 33.0  --  32.8  RDW 13.6 13.6  --  13.7  LYMPHSABS 2.6  --   --   --   MONOABS 0.3  --   --   --   EOSABS 0.1  --   --   --   BASOSABS 0.0  --   --   --     Chemistries   Recent Labs Lab 11/19/14 1918 11/20/14 0516 11/22/14 0711  NA 140 142 142  K 3.3* 3.6 3.7  CL 102 104 109  CO2 29 29 26   GLUCOSE 130* 99 83  BUN 6 <5* <5*  CREATININE 0.95 0.86 0.84  CALCIUM 9.8 9.4 8.8  AST 30  --   --   ALT 23  --   --   ALKPHOS 86  --   --   BILITOT 0.6  --   --     ------------------------------------------------------------------------------------------------------------------ estimated creatinine clearance is 83 mL/min (by C-G formula based on Cr of 0.84). ------------------------------------------------------------------------------------------------------------------ No results for input(s): HGBA1C in the last 72 hours. ------------------------------------------------------------------------------------------------------------------ No results for input(s): CHOL, HDL, LDLCALC, TRIG, CHOLHDL, LDLDIRECT in the last 72 hours. ------------------------------------------------------------------------------------------------------------------ No results for input(s): TSH, T4TOTAL, T3FREE, THYROIDAB in the last 72 hours.  Invalid input(s): FREET3 ------------------------------------------------------------------------------------------------------------------ No results for input(s): VITAMINB12, FOLATE, FERRITIN, TIBC, IRON, RETICCTPCT in the last 72 hours.  Coagulation profile  Recent Labs Lab 11/19/14 1918 11/20/14 1127  INR 1.05 1.06    No results for input(s): DDIMER in the last 72 hours.  Cardiac Enzymes No results for input(s): CKMB, TROPONINI, MYOGLOBIN in the last 168 hours.  Invalid input(s): CK ------------------------------------------------------------------------------------------------------------------ Invalid input(s): POCBNP     Time Spent in minutes   35   Susa Raring K M.D on 11/22/2014 at 9:54 AM  Between 7am to 7pm - Pager - [phone]  After 7pm go to [email or web address] - password [facility]  [facility]   Office  [phone]

## 2014-11-23 ENCOUNTER — Encounter: Payer: Self-pay | Admitting: Gastroenterology

## 2014-11-23 ENCOUNTER — Encounter (HOSPITAL_COMMUNITY)
Admission: AD | Disposition: A | Discharge status: Home / Self Care | Payer: Self-pay | Source: Ambulatory Visit | Attending: Internal Medicine

## 2014-11-23 ENCOUNTER — Inpatient Hospital Stay (HOSPITAL_COMMUNITY): Payer: Self-pay | Admitting: Anesthesiology

## 2014-11-23 ENCOUNTER — Encounter (HOSPITAL_COMMUNITY): Payer: Self-pay | Admitting: Certified Registered Nurse Anesthetist

## 2014-11-23 ENCOUNTER — Inpatient Hospital Stay (HOSPITAL_COMMUNITY): Payer: MEDICAID | Admitting: Anesthesiology

## 2014-11-23 HISTORY — PX: PARTIAL COLECTOMY: SHX5273

## 2014-11-23 LAB — SURGICAL PCR SCREEN
MRSA, PCR: NEGATIVE
STAPHYLOCOCCUS AUREUS: NEGATIVE

## 2014-11-23 SURGERY — COLECTOMY, PARTIAL
Anesthesia: General | Site: Abdomen

## 2014-11-23 MED ORDER — SODIUM CHLORIDE 0.9 % IJ SOLN: 9.0000 mL | INTRAMUSCULAR | Status: DC | PRN

## 2014-11-23 MED ORDER — NEOSTIGMINE METHYLSULFATE 10 MG/10ML IV SOLN
INTRAVENOUS | Status: AC
  Filled 2014-11-23: qty 1

## 2014-11-23 MED ORDER — EPHEDRINE SULFATE 50 MG/ML IJ SOLN
INTRAMUSCULAR | Status: DC | PRN
  Administered 2014-11-23: 15 mg via INTRAVENOUS
  Administered 2014-11-23: 10 mg via INTRAVENOUS

## 2014-11-23 MED ORDER — ALVIMOPAN 12 MG PO CAPS
12.0000 mg | ORAL_CAPSULE | Freq: Two times a day (BID) | ORAL | Status: DC
  Administered 2014-11-24 – 2014-11-25 (×4): 12 mg via ORAL
  Filled 2014-11-23 (×6): qty 1

## 2014-11-23 MED ORDER — ONDANSETRON HCL 4 MG/2ML IJ SOLN: 4.0000 mg | Freq: Four times a day (QID) | INTRAMUSCULAR | Status: DC | PRN

## 2014-11-23 MED ORDER — STERILE WATER FOR INJECTION IJ SOLN
INTRAMUSCULAR | Status: AC
  Filled 2014-11-23: qty 10

## 2014-11-23 MED ORDER — FENTANYL CITRATE 0.05 MG/ML IJ SOLN
25.0000 ug | INTRAMUSCULAR | Status: DC | PRN
  Administered 2014-11-23: 25 ug via INTRAVENOUS
  Administered 2014-11-23 (×2): 50 ug via INTRAVENOUS

## 2014-11-23 MED ORDER — PHENYLEPHRINE 40 MCG/ML (10ML) SYRINGE FOR IV PUSH (FOR BLOOD PRESSURE SUPPORT)
PREFILLED_SYRINGE | INTRAVENOUS | Status: AC
  Filled 2014-11-23: qty 10

## 2014-11-23 MED ORDER — ARTIFICIAL TEARS OP OINT
TOPICAL_OINTMENT | OPHTHALMIC | Status: AC
  Filled 2014-11-23: qty 3.5

## 2014-11-23 MED ORDER — DIPHENHYDRAMINE HCL 12.5 MG/5ML PO ELIX
12.5000 mg | ORAL_SOLUTION | Freq: Four times a day (QID) | ORAL | Status: DC | PRN
  Filled 2014-11-23: qty 5

## 2014-11-23 MED ORDER — GLYCOPYRROLATE 0.2 MG/ML IJ SOLN
INTRAMUSCULAR | Status: DC | PRN
  Administered 2014-11-23: 0.2 mg via INTRAVENOUS
  Administered 2014-11-23: 0.6 mg via INTRAVENOUS

## 2014-11-23 MED ORDER — ONDANSETRON HCL 4 MG/2ML IJ SOLN
INTRAMUSCULAR | Status: DC | PRN
  Administered 2014-11-23: 4 mg via INTRAVENOUS

## 2014-11-23 MED ORDER — LACTATED RINGERS IV SOLN
INTRAVENOUS | Status: DC | PRN
  Administered 2014-11-23 (×2): via INTRAVENOUS

## 2014-11-23 MED ORDER — VECURONIUM BROMIDE 10 MG IV SOLR
INTRAVENOUS | Status: AC
  Filled 2014-11-23: qty 10

## 2014-11-23 MED ORDER — LIDOCAINE HCL (CARDIAC) 20 MG/ML IV SOLN
INTRAVENOUS | Status: AC
  Filled 2014-11-23: qty 5

## 2014-11-23 MED ORDER — ONDANSETRON HCL 4 MG/2ML IJ SOLN: 4.0000 mg | Freq: Once | INTRAMUSCULAR | Status: DC | PRN

## 2014-11-23 MED ORDER — FENTANYL CITRATE 0.05 MG/ML IJ SOLN
INTRAMUSCULAR | Status: AC
  Filled 2014-11-23: qty 2

## 2014-11-23 MED ORDER — FENTANYL CITRATE 0.05 MG/ML IJ SOLN
INTRAMUSCULAR | Status: DC | PRN
  Administered 2014-11-23 (×2): 100 ug via INTRAVENOUS
  Administered 2014-11-23: 50 ug via INTRAVENOUS

## 2014-11-23 MED ORDER — LIDOCAINE HCL (CARDIAC) 20 MG/ML IV SOLN
INTRAVENOUS | Status: DC | PRN
  Administered 2014-11-23: 40 mg via INTRAVENOUS

## 2014-11-23 MED ORDER — HYDROMORPHONE 0.3 MG/ML IV SOLN
INTRAVENOUS | Status: AC
  Filled 2014-11-23: qty 25

## 2014-11-23 MED ORDER — PROPOFOL 10 MG/ML IV BOLUS
INTRAVENOUS | Status: AC
  Filled 2014-11-23: qty 20

## 2014-11-23 MED ORDER — NEOSTIGMINE METHYLSULFATE 10 MG/10ML IV SOLN
INTRAVENOUS | Status: DC | PRN
  Administered 2014-11-23: 4 mg via INTRAVENOUS

## 2014-11-23 MED ORDER — MIDAZOLAM HCL 5 MG/5ML IJ SOLN
INTRAMUSCULAR | Status: DC | PRN
  Administered 2014-11-23 (×2): 1 mg via INTRAVENOUS

## 2014-11-23 MED ORDER — GLYCOPYRROLATE 0.2 MG/ML IJ SOLN
INTRAMUSCULAR | Status: AC
Start: 2014-11-23 — End: 2014-11-23
  Filled 2014-11-23: qty 2

## 2014-11-23 MED ORDER — DEXAMETHASONE SODIUM PHOSPHATE 4 MG/ML IJ SOLN
INTRAMUSCULAR | Status: DC | PRN
  Administered 2014-11-23: 8 mg via INTRAVENOUS

## 2014-11-23 MED ORDER — PHENYLEPHRINE HCL 10 MG/ML IJ SOLN
INTRAMUSCULAR | Status: DC | PRN
  Administered 2014-11-23 (×2): 40 ug via INTRAVENOUS
  Administered 2014-11-23: 80 ug via INTRAVENOUS
  Administered 2014-11-23: 40 ug via INTRAVENOUS

## 2014-11-23 MED ORDER — DEXTROSE-NACL 5-0.45 % IV SOLN
INTRAVENOUS | Status: AC
  Administered 2014-11-23: 1000 mL via INTRAVENOUS
  Administered 2014-11-24 (×2): via INTRAVENOUS

## 2014-11-23 MED ORDER — FENTANYL CITRATE 0.05 MG/ML IJ SOLN
INTRAMUSCULAR | Status: AC
  Filled 2014-11-23: qty 5

## 2014-11-23 MED ORDER — DIPHENHYDRAMINE HCL 50 MG/ML IJ SOLN: 12.5000 mg | Freq: Four times a day (QID) | INTRAMUSCULAR | Status: DC | PRN

## 2014-11-23 MED ORDER — NALOXONE HCL 0.4 MG/ML IJ SOLN: 0.4000 mg | INTRAMUSCULAR | Status: DC | PRN

## 2014-11-23 MED ORDER — ROCURONIUM BROMIDE 50 MG/5ML IV SOLN
INTRAVENOUS | Status: AC
  Filled 2014-11-23: qty 1

## 2014-11-23 MED ORDER — PROPOFOL 10 MG/ML IV BOLUS
INTRAVENOUS | Status: DC | PRN
  Administered 2014-11-23: 120 mg via INTRAVENOUS

## 2014-11-23 MED ORDER — HYDROMORPHONE 0.3 MG/ML IV SOLN
INTRAVENOUS | Status: DC
  Administered 2014-11-23 (×3): 0.5 mg via INTRAVENOUS
  Administered 2014-11-23: 12:00:00 via INTRAVENOUS
  Administered 2014-11-24: 0.9 mg via INTRAVENOUS
  Administered 2014-11-24: 0.6 mg via INTRAVENOUS
  Administered 2014-11-24: 0.3 mg via INTRAVENOUS
  Administered 2014-11-24: 16:00:00 via INTRAVENOUS
  Administered 2014-11-24: 0.9 mg via INTRAVENOUS
  Administered 2014-11-24: 0.6 mg via INTRAVENOUS
  Administered 2014-11-25 (×2): 0.3 mg via INTRAVENOUS
  Administered 2014-11-25: 0.6 mg via INTRAVENOUS
  Filled 2014-11-23: qty 25

## 2014-11-23 MED ORDER — ONDANSETRON HCL 4 MG/2ML IJ SOLN
INTRAMUSCULAR | Status: AC
  Filled 2014-11-23: qty 2

## 2014-11-23 MED ORDER — EPHEDRINE SULFATE 50 MG/ML IJ SOLN
INTRAMUSCULAR | Status: AC
  Filled 2014-11-23: qty 1

## 2014-11-23 MED ORDER — ROCURONIUM BROMIDE 100 MG/10ML IV SOLN
INTRAVENOUS | Status: DC | PRN
  Administered 2014-11-23: 40 mg via INTRAVENOUS

## 2014-11-23 MED ORDER — MIDAZOLAM HCL 2 MG/2ML IJ SOLN
INTRAMUSCULAR | Status: AC
  Filled 2014-11-23: qty 2

## 2014-11-23 MED ORDER — SODIUM CHLORIDE 0.9 % IR SOLN
Status: DC | PRN
  Administered 2014-11-23: 2000 mL

## 2014-11-23 MED ORDER — ALVIMOPAN 12 MG PO CAPS
12.0000 mg | ORAL_CAPSULE | Freq: Once | ORAL | Status: AC
Start: 2014-11-23 — End: 2014-11-23
  Administered 2014-11-23: 12 mg via ORAL
  Filled 2014-11-23: qty 1

## 2014-11-23 MED ORDER — ONDANSETRON HCL 4 MG/2ML IJ SOLN
4.0000 mg | Freq: Four times a day (QID) | INTRAMUSCULAR | Status: DC | PRN
  Administered 2014-11-23 – 2014-11-24 (×2): 4 mg via INTRAVENOUS
  Filled 2014-11-23 (×2): qty 2

## 2014-11-23 SURGICAL SUPPLY — 66 items
APPLIER CLIP ROT 10 11.4 M/L (STAPLE)
BLADE SURG ROTATE 9660 (MISCELLANEOUS) ×4 IMPLANT
CANISTER SUCTION 2500CC (MISCELLANEOUS) ×4 IMPLANT
CELLS DAT CNTRL 66122 CELL SVR (MISCELLANEOUS) ×2 IMPLANT
CHLORAPREP W/TINT 26ML (MISCELLANEOUS) ×4 IMPLANT
CLIP APPLIE ROT 10 11.4 M/L (STAPLE) IMPLANT
COVER MAYO STAND STRL (DRAPES) ×12 IMPLANT
COVER SURGICAL LIGHT HANDLE (MISCELLANEOUS) ×8 IMPLANT
DRAPE LAPAROSCOPIC ABDOMINAL (DRAPES) ×4 IMPLANT
DRAPE PROXIMA HALF (DRAPES) IMPLANT
DRAPE UTILITY XL STRL (DRAPES) ×4 IMPLANT
DRAPE WARM FLUID 44X44 (DRAPE) ×4 IMPLANT
DRSG OPSITE POSTOP 4X10 (GAUZE/BANDAGES/DRESSINGS) ×4 IMPLANT
DRSG OPSITE POSTOP 4X8 (GAUZE/BANDAGES/DRESSINGS) IMPLANT
ELECT BLADE 6.5 EXT (BLADE) ×4 IMPLANT
ELECT CAUTERY BLADE 6.4 (BLADE) ×8 IMPLANT
ELECT REM PT RETURN 9FT ADLT (ELECTROSURGICAL) ×4
ELECTRODE REM PT RTRN 9FT ADLT (ELECTROSURGICAL) ×2 IMPLANT
GEL ULTRASOUND 20GR AQUASONIC (MISCELLANEOUS) IMPLANT
GLOVE SURG SIGNA 7.5 PF LTX (GLOVE) ×8 IMPLANT
GOWN STRL REUS W/ TWL LRG LVL3 (GOWN DISPOSABLE) ×12 IMPLANT
GOWN STRL REUS W/ TWL XL LVL3 (GOWN DISPOSABLE) ×6 IMPLANT
GOWN STRL REUS W/TWL LRG LVL3 (GOWN DISPOSABLE) ×12
GOWN STRL REUS W/TWL XL LVL3 (GOWN DISPOSABLE) ×6
KIT BASIN OR (CUSTOM PROCEDURE TRAY) ×4 IMPLANT
LEGGING LITHOTOMY PAIR STRL (DRAPES) ×4 IMPLANT
LIGASURE IMPACT 36 18CM CVD LR (INSTRUMENTS) ×4 IMPLANT
NS IRRIG 1000ML POUR BTL (IV SOLUTION) ×8 IMPLANT
PAD ARMBOARD 7.5X6 YLW CONV (MISCELLANEOUS) ×8 IMPLANT
PENCIL BUTTON HOLSTER BLD 10FT (ELECTRODE) ×8 IMPLANT
RTRCTR WOUND ALEXIS 18CM MED (MISCELLANEOUS) ×4
SCALPEL HARMONIC ACE (MISCELLANEOUS) IMPLANT
SCISSORS LAP 5X35 DISP (ENDOMECHANICALS) ×4 IMPLANT
SET IRRIG TUBING LAPAROSCOPIC (IRRIGATION / IRRIGATOR) IMPLANT
SLEEVE ENDOPATH XCEL 5M (ENDOMECHANICALS) ×4 IMPLANT
SPECIMEN JAR LARGE (MISCELLANEOUS) ×4 IMPLANT
STAPLER CUT CVD 40MM BLUE (STAPLE) ×8 IMPLANT
STAPLER PROXIMATE 75MM BLUE (STAPLE) ×4 IMPLANT
STAPLER VISISTAT 35W (STAPLE) ×4 IMPLANT
SUCTION POOLE TIP (SUCTIONS) ×4 IMPLANT
SURGILUBE 2OZ TUBE FLIPTOP (MISCELLANEOUS) ×4 IMPLANT
SUT PDS AB 1 TP1 96 (SUTURE) ×8 IMPLANT
SUT PROLENE 2 0 CT2 30 (SUTURE) ×4 IMPLANT
SUT PROLENE 2 0 KS (SUTURE) IMPLANT
SUT SILK 2 0 SH CR/8 (SUTURE) ×4 IMPLANT
SUT SILK 2 0 TIES 10X30 (SUTURE) ×4 IMPLANT
SUT SILK 3 0 SH CR/8 (SUTURE) ×4 IMPLANT
SUT SILK 3 0 TIES 10X30 (SUTURE) ×4 IMPLANT
SYR BULB IRRIGATION 50ML (SYRINGE) ×4 IMPLANT
SYS LAPSCP GELPORT 120MM (MISCELLANEOUS)
SYSTEM LAPSCP GELPORT 120MM (MISCELLANEOUS) IMPLANT
TOWEL OR 17X26 10 PK STRL BLUE (TOWEL DISPOSABLE) ×8 IMPLANT
TRAY FOLEY CATH 16FRSI W/METER (SET/KITS/TRAYS/PACK) ×4 IMPLANT
TRAY LAPAROSCOPIC (CUSTOM PROCEDURE TRAY) ×4 IMPLANT
TRAY PROCTOSCOPIC FIBER OPTIC (SET/KITS/TRAYS/PACK) ×4 IMPLANT
TROCAR XCEL 12X100 BLDLESS (ENDOMECHANICALS) IMPLANT
TROCAR XCEL BLUNT TIP 100MML (ENDOMECHANICALS) IMPLANT
TROCAR XCEL NON-BLD 11X100MML (ENDOMECHANICALS) IMPLANT
TROCAR XCEL NON-BLD 5MMX100MML (ENDOMECHANICALS) ×4 IMPLANT
TUBE CONNECTING 12'X1/4 (SUCTIONS) ×2
TUBE CONNECTING 12X1/4 (SUCTIONS) ×6 IMPLANT
TUBE CONNECTING 20'X1/4 (TUBING) ×1
TUBE CONNECTING 20X1/4 (TUBING) ×3 IMPLANT
TUBING FILTER THERMOFLATOR (ELECTROSURGICAL) IMPLANT
TUBING INSUFFLATION (TUBING) ×4 IMPLANT
YANKAUER SUCT BULB TIP NO VENT (SUCTIONS) ×8 IMPLANT

## 2014-11-23 NOTE — Progress Notes (Signed)
At 19:50 o'clock, patient used 3.21 mg Dilaudid.

## 2014-11-23 NOTE — Progress Notes (Signed)
Patient was receiving three doses bolus of Dilaudid. At this time patient is sleeping. Patient educated how to use PCA. Her daughter at bedside. Will continue to monitor.

## 2014-11-23 NOTE — Op Note (Signed)
Karina White, Karina White           ACCOUNT NO.:  [id number]  MEDICAL RECORD NO.:  [id number]  LOCATION:  5W05C                        FACILITY:  [facility]  PHYSICIAN:  Abigail Miyamoto, M.D. DATE OF BIRTH:  December 15, 1955  DATE OF PROCEDURE:  11/23/2014 DATE OF DISCHARGE:                              OPERATIVE REPORT   PREOPERATIVE DIAGNOSIS:  Colon mass.  POSTOPERATIVE DIAGNOSIS:  Colon mass.  PROCEDURE:  Laparoscopic converted to open partial colectomy.  SURGEON:  Abigail Miyamoto, MD  ASSISTANT:  Adolph Pollack, MD  ANESTHESIA:  General endotracheal anesthesia.  ESTIMATED BLOOD LOSS:  Minimal.  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  Decision was made to proceed with partial colectomy.  FINDINGS:  The area of the palpable mass which was tattooed endoscopically was easily identified in the distal sigmoid colon.  There was no gross evidence of metastatic disease.  PROCEDURE IN DETAIL:  The patient was brought to the operating room, identified as Karina White.  She was placed supine on the operating room table.  General anesthesia was induced.  Her abdomen was then prepped and draped in usual sterile fashion.  Using scalpel, I made a small vertical incision just above the umbilicus.  I carried this down to a hernia which was found to contain preperitoneal fat.  I excised this and then used the small fascial opening to gain entrance into the abdominal cavity.  The 0 Vicryl pursestring suture was then placed around the fascial opening.  The Hasson port was placed through the opening and insufflation of the abdomen was begun.  I placed another 5- mm port in the patient's lower midline under direct vision.  The patient's sigmoid colon was adherent to the pelvic sidewall and she had moderate adhesions of the colon to the abdominal  wall and pelvis from her previous hysterectomy.  There was also transverse colon that was stuck to the patient's lower midline at the umbilicus.  I did mobilize the sigmoid colon with laparoscopic scissors laterally.  At this point, though it became apparent secondary to the adhesions in the pelvis that I would need to convert to an open procedure.  I thus removed the 2 laparoscopic ports and converted to an open procedure.  I connected the 2 incisions with 1 large incision using the scalpel.  I then took this down through the fascia with electrocautery.  I then had the extending incision above the umbilicus.  I then took down the sigmoid colon along the white line of Toldt freeing up all adhesions.  I easily identified the left ureter.  I then was able to free the remaining sigmoid colon and rectum from the adhesions to both the vaginal cuff and abdominal sidewall and midline.  I also freed up transverse colon from the midline as well with the cautery.  I then was able to finally elevate the distal sigmoid colon and rectum up out of the pelvis and identified the area that was tattooed and could palpate the mass inside the colon.  I then transected the sigmoid colon proximal to the lesion  with GIA 75 stapler. I then took down the mesentery with the LigaSure device and then transected the most proximal rectum with a contour stapler.  I then took down the rest of the mesentery and removed the specimen completely more of the distal aspect with silk suture and sent specimen to Pathology for evaluation.  The sigmoid colon was quite redundant, fit easily back into the pelvis.  I opened up the staple line and brought sizers in the field and identified that the colon easily fit a 29 EEA stapler.  The EEA stapler was brought to the field.  I placed the anvil into the opening of the proximal sigmoid colon and then placed a 2-0 Prolene pursestring suture around this and secured in place securing the  anvil to the colon. I then cleaned off the surrounding bowel wall from some of the redundant fat.  Next, the EEA stapler was brought up through the anus and rectum and manipulated towards the end of the rectal stump.  The staple device was then opened bringing the attached end out just anterior to the staple line.  I then connected the anvil to the rest of the EEA stapler. I then slowly closed the stapling device bringing the colon down to the rectum.  The staple device was then fired creating the circular EEA stapler for anastomosis.  Again a 29 EEA stapler was used.  The donuts were then examined and found to be intact.  I then placed a bowel clamp proximal to the anastomosis and filled the pelvis with fluid.  The anastomosis was then tested by placing a proctoscope into the anal canal and insufflating the anastomosis.  No leak was identified.  At this point, the clamp was removed from the proximal colon.  I then thoroughly irrigated the abdomen with several L normal saline.  The patient's midline fascia was then closed with running #1 looped PDS suture.  The skin was then irrigated and closed with skin staples.  The patient tolerated the procedure well.  All the counts were correct at the end of procedure.  The patient was then extubated in the operating room and taken in a stable condition to the recovery room.     Abigail Miyamoto, M.D.     DB/MEDQ  D:  11/23/2014  T:  11/23/2014  Job:  [id number]

## 2014-11-23 NOTE — Transfer of Care (Signed)
Immediate Anesthesia Transfer of Care Note  Patient: Karina White  Procedure(s) Performed: Procedure(s): LAPAROSCOPIC CONVERTED TO OPEN PARTIAL COLECTOMY (N/A)  Patient Location: PACU  Anesthesia Type:General  Level of Consciousness: patient cooperative and responds to stimulation  Airway & Oxygen Therapy: Patient Spontanous Breathing and Patient connected to nasal cannula oxygen  Post-op Assessment: Report given to RN, Post -op Vital signs reviewed and stable and Patient moving all extremities X 4  Post vital signs: Reviewed and stable  Last Vitals:  Filed Vitals:   11/23/14 0517  BP: 148/77  Pulse:   Temp: 36.6 C  Resp: 16    Complications: No apparent anesthesia complications

## 2014-11-23 NOTE — Op Note (Signed)
LAPAROSCOPIC CONVERTED TO OPEN PARTIAL COLECTOMY  Procedure Note  Karina White 11/19/2014 - 11/23/2014   Pre-op Diagnosis: Colon Mass     Post-op Diagnosis: colon mass  Procedure(s): LAPAROSCOPIC CONVERTED TO OPEN PARTIAL COLECTOMY  Surgeon(s): Coralie Keens, MD Jackolyn Confer, MD  Anesthesia: General  Staff:  Circulator: Sharlot Gowda, RN Scrub Person: Jesse Sans, CST Circulator Assistant: Cyd Silence, RN Float Surgical Tech: Haydee Monica  Estimated Blood Loss: Minimal               Specimens: sigmoid colon and proximal rectum sent to path          [facility]   Date: 11/23/2014  Time: 10:56 AM

## 2014-11-23 NOTE — Progress Notes (Addendum)
Patient back from OR (open partial colectomy). Alert and oriented; complaining of pain in abdominal area ; PCA started, patient educated. Dressing on middle abdomen intact, drainage small amount of blood. Will continue to monitor.

## 2014-11-23 NOTE — Progress Notes (Addendum)
Patient received till 18:15 o'clock 2.51mg  Dilaudid via PCA. Alert and oriented, complaining of pain in abdominal area 7/10. Will continue to monitor.

## 2014-11-23 NOTE — Progress Notes (Unsigned)
Patient ID: Karina White, female   DOB: Mar 01, 1956, 59 y.o.   MRN: [id number]

## 2014-11-23 NOTE — Progress Notes (Unsigned)
Patient ID: Karina White, female   DOB: 1955-10-04, 59 y.o.   MRN: [id number]

## 2014-11-23 NOTE — Progress Notes (Signed)
Patient Demographics  Karina White, is a 59 y.o. female, DOB - Jan 19, 1956, FAO:[id number]  Admit date - 11/19/2014   Admitting Physician Joseph Art, DO  Outpatient Primary MD for the patient is No primary care provider on file.  LOS - 4   No chief complaint on file.     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 Trilby Drummer office, and by general surgery underwent hemicolectomy on 11/23/2014. Stable now.   Subjective:   Karina White today has, No headache, No chest pain, No abdominal pain - No Nausea, No new weakness tingling or numbness, No Cough - SOB.    Assessment & Plan    1. Lower GI bleed with recent history of colonic mass biosy showing tubulu villous adenoma - H&H stable, no further episode of GI bleed, Gen. Surgery seeing the patient. Post hemicolectomy on 11/23/2014, continue supportive care with IVF and pain control. Outpatient oncology, GI and general surgery follow-up.   2. Essential hypertension. Placed on low-dose Coreg along with As needed IV hydralazine.     Code Status: full  Family Communication: daughter bedside  Disposition Plan: home   Procedures   CT scan abdomen and pelvis.  Partial hemicolectomy by surgery on 11/23/2014   Consults  CCS   Medications  Scheduled Meds: . [START ON 11/24/2014] alvimopan  12 mg Oral BID  . fentaNYL      . fentaNYL      . HYDROmorphone PCA 0.3 mg/mL   Intravenous 6 times per day  . HYDROmorphone PCA 0.3 mg/mL       Continuous Infusions: . dextrose 5 % and 0.45% NaCl 1,000 mL (11/23/14 1345)   PRN Meds:.diphenhydrAMINE **OR** diphenhydrAMINE, hydrALAZINE, naloxone **AND** sodium chloride,  ondansetron  DVT Prophylaxis   SCDs   Lab Results  Component Value Date   PLT 222 11/22/2014    Antibiotic   Anti-infectives    Start     Dose/Rate Route Frequency Ordered Stop   11/23/14 0600  clindamycin (CLEOCIN) IVPB 900 mg     900 mg 100 mL/hr over 30 Minutes Intravenous 60 min pre-op 11/22/14 1051 11/23/14 0816   11/23/14 0600  gentamicin (GARAMYCIN) 460 mg in dextrose 5 % 100 mL IVPB     5 mg/kg  92 kg 111.5 mL/hr over 60 Minutes Intravenous 60 min pre-op 11/22/14 1051 11/23/14 0845   11/22/14 1400  neomycin (MYCIFRADIN) tablet 1,000 mg     1,000 mg Oral 3 times per day 11/22/14 1015 11/22/14 2234   11/22/14 1400  erythromycin (E-MYCIN) tablet 1,000 mg     1,000 mg Oral 3 times per day 11/22/14 1015 11/22/14 2234   11/22/14 1015  clindamycin (CLEOCIN) IVPB 900 mg  Status:  Discontinued     900 mg 100 mL/hr over 30 Minutes Intravenous 60 min pre-op 11/22/14 1015 11/22/14 1051   11/22/14 1015  gentamicin (GARAMYCIN) 460 mg in dextrose 5 % 100 mL IVPB  Status:  Discontinued     5 mg/kg  92 kg 111.5 mL/hr over 60 Minutes Intravenous 60 min pre-op 11/22/14 1015 11/22/14 1051          Objective:   Filed  Vitals:   11/23/14 1230 11/23/14 1320 11/23/14 1324 11/23/14 1415  BP:  149/94    Pulse: 52 64    Temp:      TempSrc:      Resp: 11  14 14   Height:      Weight:      SpO2: 100% 100% 100% 100%    Wt Readings from Last 3 Encounters:  11/20/14 92 kg (202 lb 13.2 oz)     Intake/Output Summary (Last 24 hours) at 11/23/14 1502 Last data filed at 11/23/14 1215  Gross per 24 hour  Intake   1830 ml  Output    140 ml  Net   1690 ml     Physical Exam  Awake Alert, Oriented X 3, No new F.N deficits, Normal affect [place].AT,PERRAL Supple Neck,No JVD, No cervical lymphadenopathy appriciated.  Symmetrical Chest wall movement, Good air movement bilaterally, CTAB RRR,No Gallops,Rubs or new Murmurs, No Parasternal Heave Hypoactive B.Sounds, Abd Soft, midline  abdominal wall incision site appears clean with some blood on the dressing, No organomegaly appriciated, No rebound - guarding or rigidity. No Cyanosis, Clubbing or edema, No new Rash or bruise      Data Review   Micro Results Recent Results (from the past 240 hour(s))  Surgical pcr screen     Status: None   Collection Time: 11/22/14  9:17 PM  Result Value Ref Range Status   MRSA, PCR NEGATIVE NEGATIVE Final   Staphylococcus aureus NEGATIVE NEGATIVE Final    Comment:        The Xpert SA Assay (FDA approved for NASAL specimens in patients over 20 years of age), is one component of a comprehensive surveillance program.  Test performance has been validated by [facility] for patients greater than or equal to 27 year old. It is not intended to diagnose infection nor to guide or monitor treatment.     Radiology Reports Dg Chest 2 View  11/19/2014   CLINICAL DATA:  Rectal bleeding and weakness for 2 days, cough  EXAM: CHEST  2 VIEW  COMPARISON:  None  FINDINGS: Normal heart size and pulmonary vascularity.  Calcified tortuous thoracic aorta.  Lungs clear.  No pleural effusion or pneumothorax.  Osseous structures unremarkable.  IMPRESSION: No acute abnormalities.   Electronically Signed   By: Ulyses Southward M.D.   On: 11/19/2014 20:39   Ct Abdomen Pelvis W Contrast  11/20/2014   CLINICAL DATA:  Rectal bleeding, dizziness and weakness.  EXAM: CT ABDOMEN AND PELVIS WITH CONTRAST  TECHNIQUE: Multidetector CT imaging of the abdomen and pelvis was performed using the standard protocol following bolus administration of intravenous contrast.  CONTRAST:  OMNIPAQUE IOHEXOL 300 MG/ML  SOLN  COMPARISON:  None.  FINDINGS: Lower chest: The lung bases appear clear. No pleural or pericardial effusion  Hepatobiliary: Small hyperdense structure along the dome of liver measures 4 mm, image 8/series 201. No additional liver abnormalities. The gallbladder is normal. No biliary dilatation.  Pancreas: Negative   Spleen: Negative  Adrenals/Urinary Tract: Normal adrenal glands. The right kidney appears normal. There is a cyst within the left kidney. Urinary bladder appears within normal limits.  Stomach/Bowel: Small hiatal hernia noted. The small bowel loops have a normal course and caliber. The appendix is visualized and appears normal. No obstruction. Normal appearance of the colon. Sigmoid colon mass is difficult to visualize within the un-prepped and on opacified colon.  Vascular/Lymphatic: Calcified atherosclerotic disease involves the abdominal aorta. No aneurysm. No enlarged retroperitoneal or mesenteric  adenopathy. No enlarged pelvic or inguinal lymph nodes.  Reproductive: Previous hysterectomy.  No adnexal mass.  Other: There is no ascites or focal fluid collections within the abdomen or pelvis. Small periumbilical hernia is identified which contains fat only, image 49/series 201.  Musculoskeletal: Unremarkable.  IMPRESSION: 1. No acute findings within the abdomen or pelvis. The appendix is visualized and appears normal. 2. Small hiatal hernia. 3. Small fat containing periumbilical hernia.   Electronically Signed   By: Signa Kell M.D.   On: 11/20/2014 08:07     CBC  Recent Labs Lab 11/19/14 1918 11/20/14 0516 11/21/14 0610 11/22/14 0711  WBC 4.9 4.6  --  4.4  HGB 12.9 12.9 12.2 12.4  HCT 39.2 39.1 37.8 37.8  PLT 234 241  --  222  MCV 81.2 81.3  --  81.6  MCH 26.7 26.8  --  26.8  MCHC 32.9 33.0  --  32.8  RDW 13.6 13.6  --  13.7  LYMPHSABS 2.6  --   --   --   MONOABS 0.3  --   --   --   EOSABS 0.1  --   --   --   BASOSABS 0.0  --   --   --     Chemistries   Recent Labs Lab 11/19/14 1918 11/20/14 0516 11/22/14 0711  NA 140 142 142  K 3.3* 3.6 3.7  CL 102 104 109  CO2 29 29 26   GLUCOSE 130* 99 83  BUN 6 <5* <5*  CREATININE 0.95 0.86 0.84  CALCIUM 9.8 9.4 8.8  AST 30  --   --   ALT 23  --   --   ALKPHOS 86  --   --   BILITOT 0.6  --   --     ------------------------------------------------------------------------------------------------------------------ estimated creatinine clearance is 83 mL/min (by C-G formula based on Cr of 0.84). ------------------------------------------------------------------------------------------------------------------ No results for input(s): HGBA1C in the last 72 hours. ------------------------------------------------------------------------------------------------------------------ No results for input(s): CHOL, HDL, LDLCALC, TRIG, CHOLHDL, LDLDIRECT in the last 72 hours. ------------------------------------------------------------------------------------------------------------------ No results for input(s): TSH, T4TOTAL, T3FREE, THYROIDAB in the last 72 hours.  Invalid input(s): FREET3 ------------------------------------------------------------------------------------------------------------------ No results for input(s): VITAMINB12, FOLATE, FERRITIN, TIBC, IRON, RETICCTPCT in the last 72 hours.  Coagulation profile  Recent Labs Lab 11/19/14 1918 11/20/14 1127  INR 1.05 1.06    No results for input(s): DDIMER in the last 72 hours.  Cardiac Enzymes No results for input(s): CKMB, TROPONINI, MYOGLOBIN in the last 168 hours.  Invalid input(s): CK ------------------------------------------------------------------------------------------------------------------ Invalid input(s): POCBNP     Time Spent in minutes   35   Brienna Bass K M.D on 11/23/2014 at 3:02 PM  Between 7am to 7pm - Pager - [phone]  After 7pm go to [email or web address] - password [facility]  [facility]   Office  [phone]

## 2014-11-23 NOTE — Anesthesia Postprocedure Evaluation (Signed)
  Anesthesia Post-op Note  Patient: Karina White  Procedure(s) Performed: Procedure(s): LAPAROSCOPIC CONVERTED TO OPEN PARTIAL COLECTOMY (N/A)  Patient Location: PACU  Anesthesia Type:General  Level of Consciousness: awake, alert  and oriented  Airway and Oxygen Therapy: Patient Spontanous Breathing and Patient connected to nasal cannula oxygen  Post-op Pain: mild  Post-op Assessment: Pain control OK, stable Post-op Vital Signs: stable  Last Vitals:  Filed Vitals:   11/23/14 1416  BP:   Pulse:   Temp:   Resp: 16    Complications: No apparent anesthesia complications

## 2014-11-23 NOTE — Anesthesia Preprocedure Evaluation (Signed)
Anesthesia Evaluation  Patient identified by MRN, date of birth, ID band Patient awake    Reviewed: Allergy & Precautions, NPO status , Patient's Chart, lab work & pertinent test results  Airway Mallampati: II  TM Distance: >3 FB Neck ROM: Full    Dental  (+) Partial Upper, Dental Advisory Given   Pulmonary  breath sounds clear to auscultation        Cardiovascular Rhythm:Regular     Neuro/Psych    GI/Hepatic   Endo/Other    Renal/GU      Musculoskeletal   Abdominal   Peds  Hematology   Anesthesia Other Findings   Reproductive/Obstetrics                             Anesthesia Physical Anesthesia Plan  ASA: II  Anesthesia Plan: General   Post-op Pain Management:    Induction: Intravenous  Airway Management Planned: Oral ETT  Additional Equipment:   Intra-op Plan:   Post-operative Plan:   Informed Consent: I have reviewed the patients History and Physical, chart, labs and discussed the procedure including the risks, benefits and alternatives for the proposed anesthesia with the patient or authorized representative who has indicated his/her understanding and acceptance.   Dental advisory given  Plan Discussed with: CRNA and Anesthesiologist  Anesthesia Plan Comments:         Anesthesia Quick Evaluation

## 2014-11-23 NOTE — Progress Notes (Signed)
Patient transported to OR for Hemicolectomy. Alert and oriented, no complaints of pain or other discomfort. Short Stay was called for report.

## 2014-11-24 ENCOUNTER — Encounter (HOSPITAL_COMMUNITY): Payer: Self-pay | Admitting: Surgery

## 2014-11-24 LAB — CBC
HEMATOCRIT: 37.7 % (ref 36.0–46.0)
HEMOGLOBIN: 12.7 g/dL (ref 12.0–15.0)
MCH: 27 pg (ref 26.0–34.0)
MCHC: 33.7 g/dL (ref 30.0–36.0)
MCV: 80 fL (ref 78.0–100.0)
Platelets: 216 10*3/uL (ref 150–400)
RBC: 4.71 MIL/uL (ref 3.87–5.11)
RDW: 13.3 % (ref 11.5–15.5)
WBC: 10 10*3/uL (ref 4.0–10.5)

## 2014-11-24 LAB — COMPREHENSIVE METABOLIC PANEL
ALBUMIN: 3.5 g/dL (ref 3.5–5.2)
ALT: 20 U/L (ref 0–35)
AST: 28 U/L (ref 0–37)
Alkaline Phosphatase: 78 U/L (ref 39–117)
Anion gap: 6 (ref 5–15)
BUN: 9 mg/dL (ref 6–23)
CALCIUM: 9.1 mg/dL (ref 8.4–10.5)
CO2: 26 mmol/L (ref 19–32)
Chloride: 107 mmol/L (ref 96–112)
Creatinine, Ser: 1 mg/dL (ref 0.50–1.10)
GFR calc Af Amer: 70 mL/min — ABNORMAL LOW (ref >90)
GFR calc non Af Amer: 60 mL/min — ABNORMAL LOW (ref >90)
Glucose, Bld: 192 mg/dL — ABNORMAL HIGH (ref 70–99)
Potassium: 4 mmol/L (ref 3.5–5.1)
SODIUM: 139 mmol/L (ref 135–145)
Total Bilirubin: 0.8 mg/dL (ref 0.3–1.2)
Total Protein: 6.5 g/dL (ref 6.0–8.3)

## 2014-11-24 MED ORDER — ENOXAPARIN SODIUM 40 MG/0.4ML ~~LOC~~ SOLN
40.0000 mg | SUBCUTANEOUS | Status: DC
  Administered 2014-11-24 – 2014-11-26 (×3): 40 mg via SUBCUTANEOUS
  Filled 2014-11-24 (×3): qty 0.4

## 2014-11-24 MED ORDER — CARVEDILOL PHOSPHATE ER 10 MG PO CP24
10.0000 mg | ORAL_CAPSULE | Freq: Every day | ORAL | Status: DC
  Administered 2014-11-24 – 2014-11-26 (×3): 10 mg via ORAL
  Filled 2014-11-24 (×3): qty 1

## 2014-11-24 NOTE — Progress Notes (Addendum)
Hydromorphone HCI syringe replaced. Previous hydromorphone HCL syringe wasted 1.12ml in sink with Mearl Latin, RN

## 2014-11-24 NOTE — Progress Notes (Signed)
Patient Demographics  Karina White, is a 59 y.o. female, DOB - October 07, 1955, PPI:[id number]  Admit date - 11/19/2014   Admitting Physician Joseph Art, DO  Outpatient Primary MD for the patient is No primary care provider on file.  LOS - 5   No chief complaint on file.     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 Trilby Drummer office, and by general surgery underwent hemicolectomy on 11/23/2014.    Subjective:   [name] today has, No headache, No chest pain, reports abdominal pain is controlled with the pump- No Nausea, No new weakness tingling or numbness, No Cough - SOB.    Assessment & Plan    1. Lower GI bleed with recent history of colonic mass biosy showing tubulu villous adenoma - H&H stable, no further episode of GI bleed, Gen. Surgery seeing the patient. Post hemicolectomy on 11/23/2014, will need to follow with GI, oncology as an outpatient. Patient is transferred to surgical service care.   2. Essential hypertension. Placed on low-dose Coreg along with As needed IV hydralazine.     Code Status: full  Family Communication: daughter bedside   We will sign off, PLEASE contact us if any further question arises.  Procedures   CT scan abdomen and pelvis.  Partial hemicolectomy by surgery on 11/23/2014     Medications  Scheduled Meds: . alvimopan  12 mg Oral BID  . carvedilol  10 mg Oral Daily  . enoxaparin (LOVENOX) injection  40 mg Subcutaneous Q24H  . HYDROmorphone PCA 0.3 mg/mL   Intravenous 6 times per day   Continuous Infusions: . dextrose 5 % and 0.45% NaCl Stopped (11/24/14 1232)   PRN Meds:.diphenhydrAMINE **OR** diphenhydrAMINE,  hydrALAZINE, naloxone **AND** sodium chloride, ondansetron  DVT Prophylaxis   SCDs   Lab Results  Component Value Date   PLT 216 11/24/2014    Antibiotic   Anti-infectives    Start     Dose/Rate Route Frequency Ordered Stop   11/23/14 0600  clindamycin (CLEOCIN) IVPB 900 mg     900 mg 100 mL/hr over 30 Minutes Intravenous 60 min pre-op 11/22/14 1051 11/23/14 0816   11/23/14 0600  gentamicin (GARAMYCIN) 460 mg in dextrose 5 % 100 mL IVPB     5 mg/kg  92 kg 111.5 mL/hr over 60 Minutes Intravenous 60 min pre-op 11/22/14 1051 11/23/14 0845   11/22/14 1400  neomycin (MYCIFRADIN) tablet 1,000 mg     1,000 mg Oral 3 times per day 11/22/14 1015 11/22/14 2234   11/22/14 1400  erythromycin (E-MYCIN) tablet 1,000 mg     1,000 mg Oral 3 times per day 11/22/14 1015 11/22/14 2234   11/22/14 1015  clindamycin (CLEOCIN) IVPB 900 mg  Status:  Discontinued     900 mg 100 mL/hr over 30 Minutes Intravenous 60 min pre-op 11/22/14 1015 11/22/14 1051   11/22/14 1015  gentamicin (GARAMYCIN) 460 mg in dextrose 5 % 100 mL IVPB  Status:  Discontinued     5 mg/kg  92 kg 111.5 mL/hr over 60 Minutes Intravenous 60 min pre-op 11/22/14 1015 11/22/14 1051          Objective:  Filed Vitals:   11/24/14 0525 11/24/14 0554 11/24/14 0819 11/24/14 1146  BP: 161/95     Pulse: 99     Temp: 99.6 F (37.6 C)     TempSrc: Oral     Resp: 18 17 16 16   Height:      Weight:      SpO2: 100% 100% 100% 100%    Wt Readings from Last 3 Encounters:  11/20/14 92 kg (202 lb 13.2 oz)     Intake/Output Summary (Last 24 hours) at 11/24/14 1243 Last data filed at 11/24/14 0929  Gross per 24 hour  Intake 551.67 ml  Output   2100 ml  Net -1548.33 ml     Physical Exam  Awake Alert, Oriented X 3, No new F.N deficits, Normal affect Dinosaur.AT,PERRAL Supple Neck,No JVD, No cervical lymphadenopathy appriciated.  Symmetrical Chest wall movement, Good air movement bilaterally, CTAB RRR,No Gallops,Rubs or new Murmurs,  No Parasternal Heave Bowel sounds present, Abd Soft, midline abdominal wall incision site appears clean with some blood on the dressing, covered with honeycomb mesh, No organomegaly appriciated, No rebound - guarding or rigidity. No Cyanosis, Clubbing or edema, No new Rash or bruise      Data Review   Micro Results Recent Results (from the past 240 hour(s))  Surgical pcr screen     Status: None   Collection Time: 11/22/14  9:17 PM  Result Value Ref Range Status   MRSA, PCR NEGATIVE NEGATIVE Final   Staphylococcus aureus NEGATIVE NEGATIVE Final    Comment:        The Xpert SA Assay (FDA approved for NASAL specimens in patients over 4 years of age), is one component of a comprehensive surveillance program.  Test performance has been validated by [facility] for patients greater than or equal to 61 year old. It is not intended to diagnose infection nor to guide or monitor treatment.     Radiology Reports Dg Chest 2 View  11/19/2014   CLINICAL DATA:  Rectal bleeding and weakness for 2 days, cough  EXAM: CHEST  2 VIEW  COMPARISON:  None  FINDINGS: Normal heart size and pulmonary vascularity.  Calcified tortuous thoracic aorta.  Lungs clear.  No pleural effusion or pneumothorax.  Osseous structures unremarkable.  IMPRESSION: No acute abnormalities.   Electronically Signed   By: Ulyses Southward M.D.   On: 11/19/2014 20:39   Ct Abdomen Pelvis W Contrast  11/20/2014   CLINICAL DATA:  Rectal bleeding, dizziness and weakness.  EXAM: CT ABDOMEN AND PELVIS WITH CONTRAST  TECHNIQUE: Multidetector CT imaging of the abdomen and pelvis was performed using the standard protocol following bolus administration of intravenous contrast.  CONTRAST:  OMNIPAQUE IOHEXOL 300 MG/ML  SOLN  COMPARISON:  None.  FINDINGS: Lower chest: The lung bases appear clear. No pleural or pericardial effusion  Hepatobiliary: Small hyperdense structure along the dome of liver measures 4 mm, image 8/series 201. No additional  liver abnormalities. The gallbladder is normal. No biliary dilatation.  Pancreas: Negative  Spleen: Negative  Adrenals/Urinary Tract: Normal adrenal glands. The right kidney appears normal. There is a cyst within the left kidney. Urinary bladder appears within normal limits.  Stomach/Bowel: Small hiatal hernia noted. The small bowel loops have a normal course and caliber. The appendix is visualized and appears normal. No obstruction. Normal appearance of the colon. Sigmoid colon mass is difficult to visualize within the un-prepped and on opacified colon.  Vascular/Lymphatic: Calcified atherosclerotic disease involves the abdominal aorta. No aneurysm. No  enlarged retroperitoneal or mesenteric adenopathy. No enlarged pelvic or inguinal lymph nodes.  Reproductive: Previous hysterectomy.  No adnexal mass.  Other: There is no ascites or focal fluid collections within the abdomen or pelvis. Small periumbilical hernia is identified which contains fat only, image 49/series 201.  Musculoskeletal: Unremarkable.  IMPRESSION: 1. No acute findings within the abdomen or pelvis. The appendix is visualized and appears normal. 2. Small hiatal hernia. 3. Small fat containing periumbilical hernia.   Electronically Signed   By: Signa Kell M.D.   On: 11/20/2014 08:07     CBC  Recent Labs Lab 11/19/14 1918 11/20/14 0516 11/21/14 0610 11/22/14 0711 11/24/14 0622  WBC 4.9 4.6  --  4.4 10.0  HGB 12.9 12.9 12.2 12.4 12.7  HCT 39.2 39.1 37.8 37.8 37.7  PLT 234 241  --  222 216  MCV 81.2 81.3  --  81.6 80.0  MCH 26.7 26.8  --  26.8 27.0  MCHC 32.9 33.0  --  32.8 33.7  RDW 13.6 13.6  --  13.7 13.3  LYMPHSABS 2.6  --   --   --   --   MONOABS 0.3  --   --   --   --   EOSABS 0.1  --   --   --   --   BASOSABS 0.0  --   --   --   --     Chemistries   Recent Labs Lab 11/19/14 1918 11/20/14 0516 11/22/14 0711 11/24/14 0622  NA 140 142 142 139  K 3.3* 3.6 3.7 4.0  CL 102 104 109 107  CO2 29 29 26 26   GLUCOSE  130* 99 83 192*  BUN 6 <5* <5* 9  CREATININE 0.95 0.86 0.84 1.00  CALCIUM 9.8 9.4 8.8 9.1  AST 30  --   --  28  ALT 23  --   --  20  ALKPHOS 86  --   --  78  BILITOT 0.6  --   --  0.8   ------------------------------------------------------------------------------------------------------------------ estimated creatinine clearance is 69.7 mL/min (by C-G formula based on Cr of 1). ------------------------------------------------------------------------------------------------------------------ No results for input(s): HGBA1C in the last 72 hours. ------------------------------------------------------------------------------------------------------------------ No results for input(s): CHOL, HDL, LDLCALC, TRIG, CHOLHDL, LDLDIRECT in the last 72 hours. ------------------------------------------------------------------------------------------------------------------ No results for input(s): TSH, T4TOTAL, T3FREE, THYROIDAB in the last 72 hours.  Invalid input(s): FREET3 ------------------------------------------------------------------------------------------------------------------ No results for input(s): VITAMINB12, FOLATE, FERRITIN, TIBC, IRON, RETICCTPCT in the last 72 hours.  Coagulation profile  Recent Labs Lab 11/19/14 1918 11/20/14 1127  INR 1.05 1.06    No results for input(s): DDIMER in the last 72 hours.  Cardiac Enzymes No results for input(s): CKMB, TROPONINI, MYOGLOBIN in the last 168 hours.  Invalid input(s): CK ------------------------------------------------------------------------------------------------------------------ Invalid input(s): POCBNP     Time Spent in minutes   25   Elyanna Wallick M.D on 11/24/2014 at 12:43 PM  Between 7am to 7pm - Pager - [phone]  After 7pm go to [email or web address] - password [facility]  [facility]   Office  [phone]

## 2014-11-24 NOTE — Progress Notes (Signed)
Patient ID: Karina White, female   DOB: 1956-05-19, 59 y.o.   MRN: [id number] 1 Day Post-Op  Subjective: Pt actually doing quite well today.  No nausea.  No flatus yet.  Pain well controlled with PCA.  Wants to get up and move around.    Objective: Vital signs in last 24 hours: Temp:  [97.9 F (36.6 C)-99.6 F (37.6 C)] 99.6 F (37.6 C) (03/16 0525) Pulse Rate:  [52-99] 99 (03/16 0525) Resp:  [9-22] 16 (03/16 0819) BP: (140-161)/(79-96) 161/95 mmHg (03/16 0525) SpO2:  [99 %-100 %] 100 % (03/16 0819) Last BM Date: 11/23/14  Intake/Output from previous day: 03/15 0701 - 03/16 0700 In: 2081.7 [P.O.:30; I.V.:2051.7] Out: 740 [Urine:690; Blood:50] Intake/Output this shift: Total I/O In: -  Out: 1500 [Urine:1500]  PE: Abd: soft, appropriately tender, honeycomb with old bloody drainage present, hypoactive BS, ND GU: foley in place with clear yellow output Heart: regular Lungs: CTAB  Lab Results:   Recent Labs  11/22/14 0711 11/24/14 0622  WBC 4.4 10.0  HGB 12.4 12.7  HCT 37.8 37.7  PLT 222 216   BMET  Recent Labs  11/22/14 0711 11/24/14 0622  NA 142 139  K 3.7 4.0  CL 109 107  CO2 26 26  GLUCOSE 83 192*  BUN <5* 9  CREATININE 0.84 1.00  CALCIUM 8.8 9.1   PT/INR No results for input(s): LABPROT, INR in the last 72 hours. CMP     Component Value Date/Time   NA 139 11/24/2014 0622   K 4.0 11/24/2014 0622   CL 107 11/24/2014 0622   CO2 26 11/24/2014 0622   GLUCOSE 192* 11/24/2014 0622   BUN 9 11/24/2014 0622   CREATININE 1.00 11/24/2014 0622   CALCIUM 9.1 11/24/2014 0622   PROT 6.5 11/24/2014 0622   ALBUMIN 3.5 11/24/2014 0622   AST 28 11/24/2014 0622   ALT 20 11/24/2014 0622   ALKPHOS 78 11/24/2014 0622   BILITOT 0.8 11/24/2014 0622   GFRNONAA 60* 11/24/2014 0622   GFRAA 70* 11/24/2014 0622   Lipase  No results found for: LIPASE     Studies/Results: No results found.  Anti-infectives: Anti-infectives    Start     Dose/Rate Route  Frequency Ordered Stop   11/23/14 0600  clindamycin (CLEOCIN) IVPB 900 mg     900 mg 100 mL/hr over 30 Minutes Intravenous 60 min pre-op 11/22/14 1051 11/23/14 0816   11/23/14 0600  gentamicin (GARAMYCIN) 460 mg in dextrose 5 % 100 mL IVPB     5 mg/kg  92 kg 111.5 mL/hr over 60 Minutes Intravenous 60 min pre-op 11/22/14 1051 11/23/14 0845   11/22/14 1400  neomycin (MYCIFRADIN) tablet 1,000 mg     1,000 mg Oral 3 times per day 11/22/14 1015 11/22/14 2234   11/22/14 1400  erythromycin (E-MYCIN) tablet 1,000 mg     1,000 mg Oral 3 times per day 11/22/14 1015 11/22/14 2234   11/22/14 1015  clindamycin (CLEOCIN) IVPB 900 mg  Status:  Discontinued     900 mg 100 mL/hr over 30 Minutes Intravenous 60 min pre-op 11/22/14 1015 11/22/14 1051   11/22/14 1015  gentamicin (GARAMYCIN) 460 mg in dextrose 5 % 100 mL IVPB  Status:  Discontinued     5 mg/kg  92 kg 111.5 mL/hr over 60 Minutes Intravenous 60 min pre-op 11/22/14 1015 11/22/14 1051       Assessment/Plan  1. 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 Magnus Ivan -on  entereg.  Will give clears today -mobilize and pulm toilet -Dc foley  -change honeycomb dressing given drainage, incision otherwise looks fine 2. HTN - medicine had placed on low dose coreg.  This apparently got stopped, so I will restart it for her elevated BP.  Prn hydralazine still in place as well DVT prophylaxis -lovenox, SCDs   LOS: 5 days    Gianmarco Roye E 11/24/2014, 8:50 AM Pager: [phone]

## 2014-11-25 MED ORDER — OXYCODONE-ACETAMINOPHEN 5-325 MG PO TABS
1.0000 | ORAL_TABLET | ORAL | Status: DC | PRN
  Administered 2014-11-25 – 2014-11-26 (×3): 2 via ORAL
  Filled 2014-11-25 (×3): qty 2

## 2014-11-25 NOTE — Progress Notes (Signed)
Patient ID: Karina White, female   DOB: 18-Jan-1956, 59 y.o.   MRN: [id number] 2 Days Post-Op  Subjective: Pt feels well and wants to go home and wants solid food.  Passing flatus.  No nausea.    Objective: Vital signs in last 24 hours: Temp:  [98 F (36.7 C)-98.8 F (37.1 C)] 98 F (36.7 C) (03/17 0559) Pulse Rate:  [65-71] 65 (03/17 0559) Resp:  [12-20] 12 (03/17 0736) BP: (151-185)/(71-85) 151/72 mmHg (03/17 0559) SpO2:  [96 %-100 %] 100 % (03/17 0736) Last BM Date: 11/22/14  Intake/Output from previous day: 03/16 0701 - 03/17 0700 In: 420 [P.O.:420] Out: 2700 [Urine:2700] Intake/Output this shift:    PE: Abd: soft, some BS, incision c/d/i with staples, appropriately tender Heart: regular  Lab Results:   Recent Labs  11/24/14 0622  WBC 10.0  HGB 12.7  HCT 37.7  PLT 216   BMET  Recent Labs  11/24/14 0622  NA 139  K 4.0  CL 107  CO2 26  GLUCOSE 192*  BUN 9  CREATININE 1.00  CALCIUM 9.1   PT/INR No results for input(s): LABPROT, INR in the last 72 hours. CMP     Component Value Date/Time   NA 139 11/24/2014 0622   K 4.0 11/24/2014 0622   CL 107 11/24/2014 0622   CO2 26 11/24/2014 0622   GLUCOSE 192* 11/24/2014 0622   BUN 9 11/24/2014 0622   CREATININE 1.00 11/24/2014 0622   CALCIUM 9.1 11/24/2014 0622   PROT 6.5 11/24/2014 0622   ALBUMIN 3.5 11/24/2014 0622   AST 28 11/24/2014 0622   ALT 20 11/24/2014 0622   ALKPHOS 78 11/24/2014 0622   BILITOT 0.8 11/24/2014 0622   GFRNONAA 60* 11/24/2014 0622   GFRAA 70* 11/24/2014 0622   Lipase  No results found for: LIPASE     Studies/Results: No results found.  Anti-infectives: Anti-infectives    Start     Dose/Rate Route Frequency Ordered Stop   11/23/14 0600  clindamycin (CLEOCIN) IVPB 900 mg     900 mg 100 mL/hr over 30 Minutes Intravenous 60 min pre-op 11/22/14 1051 11/23/14 0816   11/23/14 0600  gentamicin (GARAMYCIN) 460 mg in dextrose 5 % 100 mL IVPB     5 mg/kg  92 kg 111.5  mL/hr over 60 Minutes Intravenous 60 min pre-op 11/22/14 1051 11/23/14 0845   11/22/14 1400  neomycin (MYCIFRADIN) tablet 1,000 mg     1,000 mg Oral 3 times per day 11/22/14 1015 11/22/14 2234   11/22/14 1400  erythromycin (E-MYCIN) tablet 1,000 mg     1,000 mg Oral 3 times per day 11/22/14 1015 11/22/14 2234   11/22/14 1015  clindamycin (CLEOCIN) IVPB 900 mg  Status:  Discontinued     900 mg 100 mL/hr over 30 Minutes Intravenous 60 min pre-op 11/22/14 1015 11/22/14 1051   11/22/14 1015  gentamicin (GARAMYCIN) 460 mg in dextrose 5 % 100 mL IVPB  Status:  Discontinued     5 mg/kg  92 kg 111.5 mL/hr over 60 Minutes Intravenous 60 min pre-op 11/22/14 1015 11/22/14 1051       Assessment/Plan   1. 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 Magnus Ivan (pathology pending) -on entereg. Will give full liquids today and solid diet tomorrow morning for breakfast if she tolerates full liquids today -mobilize and pulm toilet -DC PCA and start percocet 2. HTN - medicine had placed on low dose coreg. This apparently got stopped, so  I will restart it for her elevated BP. Prn hydralazine still in place as well DVT prophylaxis -lovenox, SCDs Dispo -hopefully home tomorrow  LOS: 6 days    [name] 11/25/2014, 9:50 AM Pager: [phone]

## 2014-11-26 MED ORDER — OXYCODONE-ACETAMINOPHEN 5-325 MG PO TABS: 1.0000 | ORAL_TABLET | ORAL | Status: DC | PRN

## 2014-11-26 MED ORDER — CARVEDILOL PHOSPHATE ER 10 MG PO CP24: 10.0000 mg | ORAL_CAPSULE | Freq: Every day | ORAL | Status: DC

## 2014-11-26 NOTE — Discharge Instructions (Signed)
[facility]      [facility] [phone]  OPEN ABDOMINAL SURGERY: POST OP INSTRUCTIONS  Always review your discharge instruction sheet given to you by the facility where your surgery was performed.  IF YOU HAVE DISABILITY OR FAMILY LEAVE FORMS, YOU MUST BRING THEM TO THE OFFICE FOR PROCESSING.  PLEASE DO NOT GIVE THEM TO YOUR DOCTOR.  1. A prescription for pain medication may be given to you upon discharge.  Take your pain medication as prescribed, if needed.  If narcotic pain medicine is not needed, then you may take acetaminophen (Tylenol) or ibuprofen (Advil) as needed. 2. Take your usually prescribed medications unless otherwise directed. 3. If you need a refill on your pain medication, please contact your pharmacy. They will contact our office to request authorization.  Prescriptions will not be filled after 5pm or on week-ends. 4. You should follow a light diet the first few days after arrival home, such as soup and crackers, pudding, etc.unless your doctor has advised otherwise. A high-fiber, low fat diet can be resumed as tolerated.   Be sure to include lots of fluids daily. Most patients will experience some swelling and bruising on the chest and neck area.  Ice packs will help.  Swelling and bruising can take several days to resolve 5. Most patients will experience some swelling and bruising in the area of the incision. Ice pack will help. Swelling and bruising can take several days to resolve..  6. It is common to experience some constipation if taking pain medication after surgery.  Increasing fluid intake and taking a stool softener will usually help or prevent this problem from occurring.  A mild laxative (Milk of Magnesia or Miralax) should be taken according to package directions if there are no bowel movements after 48 hours. 7.  You may have steri-strips (small skin tapes) in place directly over the incision.  These strips should be left on the skin for 7-10 days.  If your  surgeon used skin glue on the incision, you may shower in 24 hours.  The glue will flake off over the next 2-3 weeks.  Any sutures or staples will be removed at the office during your follow-up visit. You may find that a light gauze bandage over your incision may keep your staples from being rubbed or pulled. You may shower and replace the bandage daily. 8. ACTIVITIES:  You may resume regular (light) daily activities beginning the next day--such as daily self-care, walking, climbing stairs--gradually increasing activities as tolerated.  You may have sexual intercourse when it is comfortable.  Refrain from any heavy lifting or straining until approved by your doctor. a. You may drive when you no longer are taking prescription pain medication, you can comfortably wear a seatbelt, and you can safely maneuver your car and apply brakes b. Return to Work: ___________________________________ 9. You should see your doctor in the office for a follow-up appointment approximately two weeks after your surgery.  Make sure that you call for this appointment within a day or two after you arrive home to insure a convenient appointment time. OTHER INSTRUCTIONS:  _____________________________________________________________ _____________________________________________________________  WHEN TO CALL YOUR DOCTOR: 1. Fever over 101.0 2. Inability to urinate 3. Nausea and/or vomiting 4. Extreme swelling or bruising 5. Continued bleeding from incision. 6. Increased pain, redness, or drainage from the incision. 7. Difficulty swallowing or breathing 8. Muscle cramping or spasms. 9. Numbness or tingling in hands or feet or around lips.  The clinic staff is available to   answer your questions during regular business hours.  Please don't hesitate to call and ask to speak to one of the nurses if you have concerns.  For further questions, please visit [email or web address]   

## 2014-11-26 NOTE — Discharge Summary (Signed)
Patient ID: Halimatou Boring MRN: [id number] DOB/AGE: [birth year] 61 y.o.  Admit date: 11/19/2014 Discharge date: 11/26/2014  Procedures: laparoscopic converted to open partial colectomy by Dr. Magnus Ivan on 11-23-14  Consults: general surgery  Reason for Admission: Karina White is a 59 y.o. female with PMH of Hypertension was diagnosed with a sigmoid mass (Tubulovillous adenoma) in February by Dr.Mann on colonoscopy. Dr.Mann was trying to get her a FU with general Surgery in follow up. Today patient noticed bright red blood per rectum followed by weakness and dizziness. She went to see Dr.Mann and was then referred to the Hospital as a Direct Admission under [facility] service. Patient denies any further episodes of bleeding today. No abd pain, no N/V. No weight loss  Admission Diagnoses:  1. Lower GI bleed secondary to tubulovillous adenoma 2. HTN  Hospital Course: The patient was admitted and general surgery was consulted.  The patient underwent a colon prep for surgery to resect this area of concern on colonoscopy.  After her prep was completed, she underwent a laparoscopic converted to open left colectomy.  The patient was given entereg.  She tolerated this procedure well.  On POD 1, she was started on clear liquids.  Her diet was then able to be advanced as tolerated.  She did have a loose BM on POD 2.  By POD 3, she was tolerating a solid diet and stable for dc home.  She was started on Coreg here by the hospitalist for her HTN.  Pathology did confirm tubular adenoma, negative margins, 0/10 LN involved.  PE: Abd: wound with small seroma present.  This was drained.  No evidence of infection.  +BS, ND, incision otherwise c/d/i  Discharge Diagnoses:  Active Problems:   Colonic mass, tubular adenoma S/p lap converted to open left colectomy   GI bleed   Essential hypertension   Discharge Medications:   Medication List    TAKE these medications        aspirin EC 81 MG tablet  Take  81 mg by mouth daily.     carvedilol 10 MG 24 hr capsule  Commonly known as:  COREG CR  Take 1 capsule (10 mg total) by mouth daily.     CENTRUM ADULTS PO  Take 1 tablet by mouth.     Garlic 1000 MG Caps  Take 1,000 mg by mouth daily.     hydrochlorothiazide 25 MG tablet  Commonly known as:  HYDRODIURIL  Take 25 mg by mouth daily.     Omega 3 1200 MG Caps  Take 1,200 mg by mouth daily.     oxyCODONE-acetaminophen 5-325 MG per tablet  Commonly known as:  PERCOCET/ROXICET  Take 1-2 tablets by mouth every 4 (four) hours as needed for moderate pain.     psyllium 0.52 G capsule  Commonly known as:  REGULOID  Take 2.08 g by mouth daily.     Vitamin D (Cholecalciferol) 1000 UNITS Caps  Take 200 mg by mouth daily.        Discharge Instructions:     Follow-up Information    Follow up with [facility] On 12/02/2014.   Specialty:  General Surgery   Why:  2:15pm, arrive 1:45pm for paperwork and staple removal    Contact information:   [address] [place] [address] [phone]       Follow up with Shelly Rubenstein, MD On 12/22/2014.   Specialty:  General Surgery   Why:  11:20am, arrive by 11:10am  Contact information:   [address] [place] [address] [phone]     [facility] with [facility] for management of HTN.  Case manager to arranage  Signed: Shelba Susi E 11/26/2014, 10:01 AM

## 2014-11-26 NOTE — Progress Notes (Signed)
Ulyess Mort to be D/C'd Home per MD order.  Discussed with the patient and all questions fully answered.  VSS, Skin clean, dry and intact without evidence of skin break down, no evidence of skin tears noted. IV catheter discontinued intact. Site without signs and symptoms of complications. Dressing and pressure applied.  An After Visit Summary was printed and given to the patient. Patient received prescription.  D/c education completed with patient/family including follow up instructions, medication list, d/c activities limitations if indicated, with other d/c instructions as indicated by MD - patient able to verbalize understanding, all questions fully answered.   Patient instructed to return to ED, call 911, or call MD for any changes in condition.   Patient escorted via [place], and D/C home via private auto.  Audria Nine F 11/26/2014 12:23 PM

## 2014-11-30 ENCOUNTER — Inpatient Hospital Stay: Payer: Self-pay | Admitting: Family Medicine

## 2015-12-15 ENCOUNTER — Emergency Department (HOSPITAL_BASED_OUTPATIENT_CLINIC_OR_DEPARTMENT_OTHER)
Admission: EM | Admit: 2015-12-15 | Discharge: 2015-12-15 | Disposition: A | Discharge status: Home / Self Care | Payer: No Typology Code available for payment source | Attending: Emergency Medicine | Admitting: Emergency Medicine

## 2015-12-15 ENCOUNTER — Emergency Department (HOSPITAL_BASED_OUTPATIENT_CLINIC_OR_DEPARTMENT_OTHER): Payer: No Typology Code available for payment source

## 2015-12-15 ENCOUNTER — Encounter (HOSPITAL_BASED_OUTPATIENT_CLINIC_OR_DEPARTMENT_OTHER): Payer: Self-pay | Admitting: Emergency Medicine

## 2015-12-15 DIAGNOSIS — Y9357 Activity, non-running track and field events: Secondary | ICD-10-CM | POA: Insufficient documentation

## 2015-12-15 DIAGNOSIS — M25511 Pain in right shoulder: Secondary | ICD-10-CM | POA: Diagnosis not present

## 2015-12-15 DIAGNOSIS — S46901A Unspecified injury of unspecified muscle, fascia and tendon at shoulder and upper arm level, right arm, initial encounter: Secondary | ICD-10-CM | POA: Diagnosis present

## 2015-12-15 DIAGNOSIS — Y9289 Other specified places as the place of occurrence of the external cause: Secondary | ICD-10-CM | POA: Diagnosis not present

## 2015-12-15 DIAGNOSIS — Y999 Unspecified external cause status: Secondary | ICD-10-CM | POA: Insufficient documentation

## 2015-12-15 DIAGNOSIS — M25552 Pain in left hip: Secondary | ICD-10-CM | POA: Insufficient documentation

## 2015-12-15 DIAGNOSIS — I10 Essential (primary) hypertension: Secondary | ICD-10-CM | POA: Insufficient documentation

## 2015-12-15 HISTORY — DX: Essential (primary) hypertension: I10

## 2015-12-15 MED ORDER — HYDROCODONE-ACETAMINOPHEN 5-325 MG PO TABS: 1.0000 | ORAL_TABLET | Freq: Four times a day (QID) | ORAL | Status: DC | PRN
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
## 2015-12-15 NOTE — ED Notes (Signed)
Patient transported to X-ray 

## 2015-12-15 NOTE — ED Notes (Signed)
Patient ambulatory to restroom  ?

## 2015-12-15 NOTE — ED Notes (Signed)
MD at bedside. 
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
## 2015-12-15 NOTE — ED Notes (Signed)
Family at bedside. 
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
## 2021-09-07 ENCOUNTER — Ambulatory Visit: Payer: Self-pay | Attending: Internal Medicine

## 2021-09-07 DIAGNOSIS — Z23 Encounter for immunization: Secondary | ICD-10-CM

## 2021-09-07 NOTE — Progress Notes (Signed)
Covid-19 Vaccination Clinic  Name:  Karina White    MRN: [id number] DOB: 1955/10/29  09/07/2021  Ms. Karina White was observed post Covid-19 immunization for 15 minutes without incident. She was provided with Vaccine Information Sheet and instruction to access the V-Safe system.   Ms. Karina White was instructed to call 911 with any severe reactions post vaccine: Difficulty breathing  Swelling of face and throat  A fast heartbeat  A bad rash all over body  Dizziness and weakness   Immunizations Administered     Name Date Dose VIS Date Route   Pfizer Covid-19 Vaccine Bivalent Booster 09/07/2021 11:14 AM 0.3 mL 05/10/2021 Intramuscular   Manufacturer: [employer], [employer]   Lot: [id number]   NDC: [phone]

## 2021-09-08 ENCOUNTER — Other Ambulatory Visit (HOSPITAL_BASED_OUTPATIENT_CLINIC_OR_DEPARTMENT_OTHER): Payer: Self-pay

## 2021-09-08 MED ORDER — PFIZER COVID-19 VAC BIVALENT 30 MCG/0.3ML IM SUSP
INTRAMUSCULAR | 0 refills | Status: DC
  Filled 2021-09-08: qty 0.3, 1d supply, fill #0

## 2023-06-11 ENCOUNTER — Emergency Department (HOSPITAL_BASED_OUTPATIENT_CLINIC_OR_DEPARTMENT_OTHER): Payer: Medicare Other

## 2023-06-11 ENCOUNTER — Encounter (HOSPITAL_BASED_OUTPATIENT_CLINIC_OR_DEPARTMENT_OTHER): Payer: Self-pay | Admitting: Emergency Medicine

## 2023-06-11 ENCOUNTER — Emergency Department (HOSPITAL_BASED_OUTPATIENT_CLINIC_OR_DEPARTMENT_OTHER)
Admission: EM | Admit: 2023-06-11 | Discharge: 2023-06-11 | Disposition: A | Discharge status: Home / Self Care | Payer: Medicare Other | Attending: Emergency Medicine | Admitting: Emergency Medicine

## 2023-06-11 ENCOUNTER — Other Ambulatory Visit: Payer: Self-pay

## 2023-06-11 DIAGNOSIS — Z7982 Long term (current) use of aspirin: Secondary | ICD-10-CM | POA: Insufficient documentation

## 2023-06-11 DIAGNOSIS — E876 Hypokalemia: Secondary | ICD-10-CM | POA: Diagnosis not present

## 2023-06-11 DIAGNOSIS — Z79899 Other long term (current) drug therapy: Secondary | ICD-10-CM | POA: Insufficient documentation

## 2023-06-11 DIAGNOSIS — C9000 Multiple myeloma not having achieved remission: Secondary | ICD-10-CM

## 2023-06-11 DIAGNOSIS — I10 Essential (primary) hypertension: Secondary | ICD-10-CM | POA: Insufficient documentation

## 2023-06-11 DIAGNOSIS — R109 Unspecified abdominal pain: Secondary | ICD-10-CM | POA: Insufficient documentation

## 2023-06-11 LAB — BASIC METABOLIC PANEL
Anion gap: 14 (ref 5–15)
BUN: 12 mg/dL (ref 8–23)
CO2: 25 mmol/L (ref 22–32)
Calcium: 9 mg/dL (ref 8.9–10.3)
Chloride: 101 mmol/L (ref 98–111)
Creatinine, Ser: 0.96 mg/dL (ref 0.44–1.00)
GFR, Estimated: >60 mL/min (ref >60)
Glucose, Bld: 104 mg/dL — ABNORMAL HIGH (ref 70–99)
Potassium: 3.4 mmol/L — ABNORMAL LOW (ref 3.5–5.1)
Sodium: 140 mmol/L (ref 135–145)

## 2023-06-11 LAB — CBC
HCT: 39.3 % (ref 36.0–46.0)
Hemoglobin: 12.5 g/dL (ref 12.0–15.0)
MCH: 24.8 pg — ABNORMAL LOW (ref 26.0–34.0)
MCHC: 31.8 g/dL (ref 30.0–36.0)
MCV: 78 fL — ABNORMAL LOW (ref 80.0–100.0)
Platelets: 439 10*3/uL — ABNORMAL HIGH (ref 150–400)
RBC: 5.04 MIL/uL (ref 3.87–5.11)
RDW: 15.8 % — ABNORMAL HIGH (ref 11.5–15.5)
WBC: 7.8 10*3/uL (ref 4.0–10.5)
nRBC: 0 % (ref 0.0–0.2)

## 2023-06-11 LAB — URINALYSIS, ROUTINE W REFLEX MICROSCOPIC
Bilirubin Urine: NEGATIVE
Glucose, UA: NEGATIVE mg/dL
Ketones, ur: 15 mg/dL — AB
Leukocytes,Ua: NEGATIVE
Nitrite: NEGATIVE
Protein, ur: NEGATIVE mg/dL
Specific Gravity, Urine: 1.02 (ref 1.005–1.030)
pH: 6 (ref 5.0–8.0)

## 2023-06-11 LAB — LIPASE, BLOOD: Lipase: 28 U/L (ref 11–51)

## 2023-06-11 LAB — HEPATIC FUNCTION PANEL
ALT: 18 U/L (ref 0–44)
AST: 35 U/L (ref 15–41)
Albumin: 3.2 g/dL — ABNORMAL LOW (ref 3.5–5.0)
Alkaline Phosphatase: 167 U/L — ABNORMAL HIGH (ref 38–126)
Bilirubin, Direct: 0.1 mg/dL (ref 0.0–0.2)
Indirect Bilirubin: 0.4 mg/dL (ref 0.3–0.9)
Total Bilirubin: 0.5 mg/dL (ref 0.3–1.2)
Total Protein: 8 g/dL (ref 6.5–8.1)

## 2023-06-11 LAB — URINALYSIS, MICROSCOPIC (REFLEX)

## 2023-06-11 MED ORDER — IOHEXOL 300 MG/ML  SOLN
100.0000 mL | Freq: Once | INTRAMUSCULAR | Status: AC | PRN
  Administered 2023-06-11: 100 mL via INTRAVENOUS

## 2023-06-11 MED ORDER — ONDANSETRON HCL 4 MG PO TABS
4.0000 mg | ORAL_TABLET | Freq: Three times a day (TID) | ORAL | 0 refills | Status: DC | PRN
Start: 2023-06-11 — End: 2023-06-18

## 2023-06-11 MED ORDER — HYDROMORPHONE HCL 1 MG/ML IJ SOLN
1.0000 mg | Freq: Once | INTRAMUSCULAR | Status: AC
  Administered 2023-06-11: 1 mg via INTRAVENOUS
  Filled 2023-06-11: qty 1

## 2023-06-11 MED ORDER — ONDANSETRON HCL 4 MG PO TABS: 4.0000 mg | ORAL_TABLET | Freq: Four times a day (QID) | ORAL | 0 refills | Status: DC

## 2023-06-11 MED ORDER — ONDANSETRON HCL 4 MG/2ML IJ SOLN
4.0000 mg | Freq: Once | INTRAMUSCULAR | Status: AC
  Administered 2023-06-11: 4 mg via INTRAVENOUS
  Filled 2023-06-11: qty 2

## 2023-06-11 MED ORDER — OXYCODONE-ACETAMINOPHEN 5-325 MG PO TABS
1.0000 | ORAL_TABLET | Freq: Three times a day (TID) | ORAL | 0 refills | Status: AC | PRN
Start: 2023-06-11 — End: 2023-06-14

## 2023-06-11 MED ORDER — OXYCODONE-ACETAMINOPHEN 5-325 MG PO TABS
1.0000 | ORAL_TABLET | Freq: Once | ORAL | Status: AC
  Administered 2023-06-11: 1 via ORAL
  Filled 2023-06-11: qty 1

## 2023-06-11 NOTE — ED Notes (Signed)
RT ambulated with pt. Her O2 saturation remained 96-100% during ambulation.

## 2023-06-11 NOTE — ED Provider Notes (Signed)
[facility] Provider Note   CSN: [id number] Arrival date & time: 06/11/23  1126     History  Chief Complaint  Patient presents with   Flank Pain    Karina White is a 67 y.o. female with PMHx HTN who presents to ED concerned for left flank pain x3 days. No hx of kidney stones. Also concerned for some nausea. States that she does not think it is a muscular pain because the pain is constant no matter what she is doing. Pain starts in left middle thoracic area and radiates around to the anterior LUQ.  Denies fever, chest pain, dyspnea, cough, vomiting, diarrhea, dysuria, hematuria, hematochezia.     Flank Pain       Home Medications Prior to Admission medications   Medication Sig Start Date End Date Taking? Authorizing Provider  aspirin EC 81 MG tablet Take 81 mg by mouth daily.    [provider]  carvedilol (COREG CR) 10 MG 24 hr capsule Take 1 capsule (10 mg total) by mouth daily. 11/26/14   Barnetta Chapel, PA-C  COVID-19 mRNA bivalent vaccine, Pfizer, (PFIZER COVID-19 VAC BIVALENT) injection Inject into the muscle. 09/07/21   Judyann Munson, MD  Garlic 1000 MG CAPS Take 1,000 mg by mouth daily.    [provider]  hydrochlorothiazide (HYDRODIURIL) 25 MG tablet Take 25 mg by mouth daily.    [provider]  HYDROcodone-acetaminophen (NORCO) 5-325 MG tablet Take 1-2 tablets by mouth every 6 (six) hours as needed. 12/15/15   Geoffery Lyons, MD  Multiple Vitamins-Minerals (CENTRUM ADULTS PO) Take 1 tablet by mouth.    [provider]  Omega 3 1200 MG CAPS Take 1,200 mg by mouth daily.    [provider]  oxyCODONE-acetaminophen (PERCOCET/ROXICET) 5-325 MG per tablet Take 1-2 tablets by mouth every 4 (four) hours as needed for moderate pain. 11/26/14   Barnetta Chapel, PA-C  psyllium (REGULOID) 0.52 G capsule Take 2.08 g by mouth daily.    [provider]  Vitamin D, Cholecalciferol,  1000 UNITS CAPS Take 200 mg by mouth daily.    [provider]      Allergies    Penicillins and Codeine    Review of Systems   Review of Systems  Genitourinary:  Positive for flank pain.    Physical Exam Updated Vital Signs BP (!) 195/106   Pulse 100   Temp 98.4 F (36.9 C) (Oral)   Resp 16   Wt 81.6 kg   SpO2 100%   BMI 28.19 kg/m  Physical Exam Vitals and nursing note reviewed.  Constitutional:      General: She is not in acute distress.    Appearance: She is not ill-appearing or toxic-appearing.  HENT:     Head: Normocephalic and atraumatic.     Mouth/Throat:     Mouth: Mucous membranes are moist.  Eyes:     General: No scleral icterus.       Right eye: No discharge.        Left eye: No discharge.     Conjunctiva/sclera: Conjunctivae normal.  Cardiovascular:     Rate and Rhythm: Normal rate and regular rhythm.     Pulses: Normal pulses.     Heart sounds: Normal heart sounds. No murmur heard. Pulmonary:     Effort: Pulmonary effort is normal. No respiratory distress.     Breath sounds: Normal breath sounds. No wheezing, rhonchi or rales.  Abdominal:  General: Abdomen is flat. Bowel sounds are normal. There is no distension.     Palpations: Abdomen is soft. There is no mass.     Tenderness: There is no abdominal tenderness.  Musculoskeletal:     Right lower leg: No edema.     Left lower leg: No edema.  Skin:    General: Skin is warm and dry.     Findings: No rash.  Neurological:     General: No focal deficit present.     Mental Status: She is alert. Mental status is at baseline.  Psychiatric:        Mood and Affect: Mood normal.     ED Results / Procedures / Treatments   Labs (all labs ordered are listed, but only abnormal results are displayed) Labs Reviewed  URINALYSIS, ROUTINE W REFLEX MICROSCOPIC  CBC  BASIC METABOLIC PANEL    EKG None  Radiology No results found.  Procedures Procedures    Medications Ordered in  ED Medications  oxyCODONE-acetaminophen (PERCOCET/ROXICET) 5-325 MG per tablet 1 tablet (1 tablet Oral Given 06/11/23 1205)    ED Course/ Medical Decision Making/ A&P                                 Medical Decision Making Amount and/or Complexity of Data Reviewed Labs: ordered. Radiology: ordered.  Risk Prescription drug management.    This patient presents to the ED for concern of left flank pain, this involves an extensive number of treatment options, and is a complaint that carries with it a high risk of complications and morbidity.  The differential diagnosis includes gastroenteritis, colitis, small bowel obstruction, appendicitis, cholecystitis, pancreatitis, nephrolithiasis, UTI, pyelonephritis  Co morbidities that complicate the patient evaluation  HTN   Additional history obtained:  CT abd/pelv from 2016: patient with hiatal hernia and periumbilical hernia   Lab Tests:  I Ordered, and personally interpreted labs.  The pertinent results include: CBC with differential: No concern for anemia or leukocytosis BMP: mild hypokalemia at 3.4 Lipase: within normal limits UA: not concerning for infection   Imaging Studies ordered:  I ordered imaging studies including   -CT Abd/Pelvis with contrast: evaluate for structural/surgical etiology of patients' severe abdominal pain.  I independently visualized and interpreted imaging I agree with the radiologist interpretation   Problem List / ED Course / Critical interventions / Medication management  Patient presented for left flank pain. Physical exam unremarkable. Patient is afebrile with stable vitals.  CBC without leukocytosis or anemia.  BMP reassuring.  UA not concerning for infection.  Lipase within normal limits. CT abdomen pelvis showing diffuse lytic lesions  - which I believe is causing her pain.  No other signs in CT for metastasizing disease. There were pleural effusions shown on the lower lungs - patient is  denying any respiratory complaints. Patient ambulated in ED with O2 >96% on RA. Shared results with patient. Educated patient that I will be referring her to an outpatient oncologist for further workup and treatment. Patient endorsed understanding of plan. Consulted with Oncologist Dr. Al Pimple who recommended obtaining further lab workup and CT chest imaging to better prepare patient for her first outpatient oncology appointment. I have submitted these orders. Overall, patient is well appearing and does not meet criteria for inpatient admission.  Will provide patient with Percocet for breakthrough pain and zofran for nausea.  I have reviewed the patients home medicines and have made adjustments as needed  Patient was given return precautions. Patient stable for discharge at this time.  Patient verbalized understanding of plan.  Ddx: These are considered less likely due to history of present illness and physical exam. -gastroenteritis: No vomiting in ED; no fever; tolerating PO intake -colitis: Denies diarrhea, patient afebrile  -small bowel obstruction: Last BM yesterday  -appendicitis: CT without concern -cholecystitis: CT without concern; liver enzymes within normal limits  -pancreatitis: CT without concern; lipase within normal limits  -nephrolithiasis: CT without concern; denies urinary complaint -UTI/pyelonephritis: Denies urinary complaints; UA without concern     Social Determinants of Health:  none          Final Clinical Impression(s) / ED Diagnoses Final diagnoses:  None    Rx / DC Orders ED Discharge Orders     None         Dorthy Cooler, [place] 06/11/23 Corky Crafts    Alvira Monday, MD 06/12/23 1311

## 2023-06-11 NOTE — Discharge Instructions (Addendum)
As discussed, your imaging was concerning for multiple myeloma. I have sent a referral for an Oncologist here in [facility]. If you don't hear from them in 48 hours, you can attempt calling them for an appointment. Their information is in this discharge paperwork. Seek emergency care if experiencing any new or worsening symptoms.  Alternating between 650 mg Tylenol and 400 mg Advil: The best way to alternate taking Acetaminophen (example Tylenol) and Ibuprofen (example Advil/Motrin) is to take them 3 hours apart. For example, if you take ibuprofen at 6 am you can then take Tylenol at 9 am. You can continue this regimen throughout the day, making sure you do not exceed the recommended maximum dose for each drug.

## 2023-06-11 NOTE — ED Triage Notes (Signed)
Left flank x 3 days , denies urinary symptoms ort Hx kidney stones . Obvious discomfort . Reports nausea . No Hx sciatica

## 2023-06-12 LAB — KAPPA/LAMBDA LIGHT CHAINS
Kappa free light chain: 33.3 mg/L — ABNORMAL HIGH (ref 3.3–19.4)
Kappa, lambda light chain ratio: 1.74 — ABNORMAL HIGH (ref 0.26–1.65)
Lambda free light chains: 19.1 mg/L (ref 5.7–26.3)

## 2023-06-12 LAB — LACTATE DEHYDROGENASE: LDH: 324 U/L — ABNORMAL HIGH (ref 98–192)

## 2023-06-13 LAB — PROTEIN ELECTROPHORESIS, SERUM
A/G Ratio: 0.6 — ABNORMAL LOW (ref 0.7–1.7)
Albumin ELP: 2.7 g/dL — ABNORMAL LOW (ref 2.9–4.4)
Alpha-1-Globulin: 0.5 g/dL — ABNORMAL HIGH (ref 0.0–0.4)
Alpha-2-Globulin: 1.6 g/dL — ABNORMAL HIGH (ref 0.4–1.0)
Beta Globulin: 1.3 g/dL (ref 0.7–1.3)
Gamma Globulin: 1.1 g/dL (ref 0.4–1.8)
Globulin, Total: 4.5 g/dL — ABNORMAL HIGH (ref 2.2–3.9)
Total Protein ELP: 7.2 g/dL (ref 6.0–8.5)

## 2023-06-13 LAB — IMMUNOGLOBULINS A/E/G/M, SERUM
IgA: 210 mg/dL (ref 87–352)
IgE (Immunoglobulin E), Serum: 136 [IU]/mL (ref 6–495)
IgG (Immunoglobin G), Serum: 1348 mg/dL (ref 586–1602)
IgM (Immunoglobulin M), Srm: 72 mg/dL (ref 26–217)

## 2023-06-15 ENCOUNTER — Other Ambulatory Visit: Payer: Self-pay

## 2023-06-15 ENCOUNTER — Encounter (HOSPITAL_BASED_OUTPATIENT_CLINIC_OR_DEPARTMENT_OTHER): Payer: Self-pay | Admitting: Emergency Medicine

## 2023-06-15 ENCOUNTER — Inpatient Hospital Stay (HOSPITAL_BASED_OUTPATIENT_CLINIC_OR_DEPARTMENT_OTHER)
Admission: EM | Admit: 2023-06-15 | Discharge: 2023-06-18 | DRG: 543 | Disposition: A | Discharge status: Home / Self Care | Payer: Medicare Other | Attending: Internal Medicine | Admitting: Internal Medicine

## 2023-06-15 DIAGNOSIS — D509 Iron deficiency anemia, unspecified: Secondary | ICD-10-CM | POA: Diagnosis present

## 2023-06-15 DIAGNOSIS — J910 Malignant pleural effusion: Secondary | ICD-10-CM | POA: Diagnosis present

## 2023-06-15 DIAGNOSIS — G893 Neoplasm related pain (acute) (chronic): Principal | ICD-10-CM | POA: Diagnosis present

## 2023-06-15 DIAGNOSIS — Z7982 Long term (current) use of aspirin: Secondary | ICD-10-CM

## 2023-06-15 DIAGNOSIS — Z880 Allergy status to penicillin: Secondary | ICD-10-CM | POA: Diagnosis not present

## 2023-06-15 DIAGNOSIS — K6389 Other specified diseases of intestine: Principal | ICD-10-CM

## 2023-06-15 DIAGNOSIS — M549 Dorsalgia, unspecified: Secondary | ICD-10-CM | POA: Diagnosis present

## 2023-06-15 DIAGNOSIS — Z885 Allergy status to narcotic agent status: Secondary | ICD-10-CM | POA: Diagnosis not present

## 2023-06-15 DIAGNOSIS — C7951 Secondary malignant neoplasm of bone: Secondary | ICD-10-CM | POA: Diagnosis present

## 2023-06-15 DIAGNOSIS — C7801 Secondary malignant neoplasm of right lung: Secondary | ICD-10-CM | POA: Diagnosis present

## 2023-06-15 DIAGNOSIS — Z90710 Acquired absence of both cervix and uterus: Secondary | ICD-10-CM

## 2023-06-15 DIAGNOSIS — M899 Disorder of bone, unspecified: Principal | ICD-10-CM

## 2023-06-15 DIAGNOSIS — R7402 Elevation of levels of lactic acid dehydrogenase (LDH): Secondary | ICD-10-CM | POA: Diagnosis not present

## 2023-06-15 DIAGNOSIS — J90 Pleural effusion, not elsewhere classified: Secondary | ICD-10-CM | POA: Diagnosis not present

## 2023-06-15 DIAGNOSIS — Z9049 Acquired absence of other specified parts of digestive tract: Secondary | ICD-10-CM | POA: Diagnosis not present

## 2023-06-15 DIAGNOSIS — C50919 Malignant neoplasm of unspecified site of unspecified female breast: Secondary | ICD-10-CM | POA: Diagnosis present

## 2023-06-15 DIAGNOSIS — R52 Pain, unspecified: Secondary | ICD-10-CM

## 2023-06-15 DIAGNOSIS — C7802 Secondary malignant neoplasm of left lung: Secondary | ICD-10-CM | POA: Diagnosis present

## 2023-06-15 DIAGNOSIS — E8809 Other disorders of plasma-protein metabolism, not elsewhere classified: Secondary | ICD-10-CM | POA: Diagnosis present

## 2023-06-15 DIAGNOSIS — R970 Elevated carcinoembryonic antigen [CEA]: Secondary | ICD-10-CM | POA: Diagnosis not present

## 2023-06-15 DIAGNOSIS — Z91199 Patient's noncompliance with other medical treatment and regimen due to unspecified reason: Secondary | ICD-10-CM

## 2023-06-15 DIAGNOSIS — Z79899 Other long term (current) drug therapy: Secondary | ICD-10-CM | POA: Diagnosis not present

## 2023-06-15 DIAGNOSIS — D638 Anemia in other chronic diseases classified elsewhere: Secondary | ICD-10-CM | POA: Diagnosis present

## 2023-06-15 DIAGNOSIS — I10 Essential (primary) hypertension: Secondary | ICD-10-CM | POA: Diagnosis present

## 2023-06-15 DIAGNOSIS — M898X9 Other specified disorders of bone, unspecified site: Secondary | ICD-10-CM

## 2023-06-15 DIAGNOSIS — K2951 Unspecified chronic gastritis with bleeding: Secondary | ICD-10-CM

## 2023-06-15 LAB — BASIC METABOLIC PANEL
Anion gap: 15 (ref 5–15)
BUN: 9 mg/dL (ref 8–23)
CO2: 23 mmol/L (ref 22–32)
Calcium: 9.2 mg/dL (ref 8.9–10.3)
Chloride: 102 mmol/L (ref 98–111)
Creatinine, Ser: 0.86 mg/dL (ref 0.44–1.00)
GFR, Estimated: >60 mL/min (ref >60)
Glucose, Bld: 109 mg/dL — ABNORMAL HIGH (ref 70–99)
Potassium: 3.7 mmol/L (ref 3.5–5.1)
Sodium: 140 mmol/L (ref 135–145)

## 2023-06-15 LAB — CBC
HCT: 38.5 % (ref 36.0–46.0)
Hemoglobin: 12.2 g/dL (ref 12.0–15.0)
MCH: 24.4 pg — ABNORMAL LOW (ref 26.0–34.0)
MCHC: 31.7 g/dL (ref 30.0–36.0)
MCV: 77.2 fL — ABNORMAL LOW (ref 80.0–100.0)
Platelets: 378 10*3/uL (ref 150–400)
RBC: 4.99 MIL/uL (ref 3.87–5.11)
RDW: 15.8 % — ABNORMAL HIGH (ref 11.5–15.5)
WBC: 10.4 10*3/uL (ref 4.0–10.5)
nRBC: 0 % (ref 0.0–0.2)

## 2023-06-15 MED ORDER — ASPIRIN 81 MG PO TBEC
81.0000 mg | DELAYED_RELEASE_TABLET | Freq: Every day | ORAL | Status: DC
  Administered 2023-06-16 – 2023-06-18 (×3): 81 mg via ORAL
  Filled 2023-06-15 (×3): qty 1

## 2023-06-15 MED ORDER — OXYCODONE-ACETAMINOPHEN 5-325 MG PO TABS
1.0000 | ORAL_TABLET | ORAL | Status: DC | PRN
  Administered 2023-06-16 – 2023-06-18 (×4): 1 via ORAL
  Filled 2023-06-15 (×5): qty 1

## 2023-06-15 MED ORDER — MORPHINE SULFATE (PF) 2 MG/ML IV SOLN: 1.0000 mg | INTRAVENOUS | Status: DC | PRN

## 2023-06-15 MED ORDER — HYDROMORPHONE HCL 1 MG/ML IJ SOLN
0.5000 mg | Freq: Once | INTRAMUSCULAR | Status: AC
  Administered 2023-06-15: 0.5 mg via INTRAVENOUS
  Filled 2023-06-15: qty 1

## 2023-06-15 MED ORDER — ENOXAPARIN SODIUM 40 MG/0.4ML IJ SOSY
40.0000 mg | PREFILLED_SYRINGE | INTRAMUSCULAR | Status: DC
  Administered 2023-06-15 – 2023-06-16 (×2): 40 mg via SUBCUTANEOUS
  Filled 2023-06-15 (×2): qty 0.4

## 2023-06-15 MED ORDER — ONDANSETRON HCL 4 MG/2ML IJ SOLN
4.0000 mg | Freq: Once | INTRAMUSCULAR | Status: AC
  Administered 2023-06-15: 4 mg via INTRAVENOUS
  Filled 2023-06-15: qty 2

## 2023-06-15 MED ORDER — LABETALOL HCL 5 MG/ML IV SOLN
5.0000 mg | INTRAVENOUS | Status: DC | PRN
  Administered 2023-06-16 – 2023-06-17 (×2): 5 mg via INTRAVENOUS
  Filled 2023-06-15 (×2): qty 4

## 2023-06-15 MED ORDER — HYDRALAZINE HCL 20 MG/ML IJ SOLN
5.0000 mg | Freq: Once | INTRAMUSCULAR | Status: AC
  Administered 2023-06-15: 5 mg via INTRAVENOUS
  Filled 2023-06-15: qty 1

## 2023-06-15 MED ORDER — CARVEDILOL PHOSPHATE ER 10 MG PO CP24
10.0000 mg | ORAL_CAPSULE | Freq: Every day | ORAL | Status: DC
  Administered 2023-06-16 – 2023-06-18 (×3): 10 mg via ORAL
  Filled 2023-06-15 (×3): qty 1

## 2023-06-15 MED ORDER — ONDANSETRON HCL 4 MG/2ML IJ SOLN
4.0000 mg | Freq: Four times a day (QID) | INTRAMUSCULAR | Status: DC | PRN
  Administered 2023-06-17: 4 mg via INTRAVENOUS
  Filled 2023-06-15: qty 2

## 2023-06-15 NOTE — ED Notes (Addendum)
Spoke with [facility] pharmacist regarding IV Apresoline 5mg . Instructed to hold for 15-20 min then recheck BP
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
## 2023-06-15 NOTE — ED Provider Notes (Signed)
[place] [facility] Provider Note   CSN: [id number] Arrival date & time: 06/15/23  1220     History  Chief Complaint  Patient presents with   Back Pain    Karina White is a 67 y.o. female who presents emergency department chief complaint of severe left flank pain.  Patient was seen on 06/11/2023 for evaluation of severe flank pain.  She had a CT scan that showed multiple lytic lesions involving the spine, pelvis and rib cage.  These were new findings for the patient.  These are either secondary to a primary diagnosis of multiple myeloma or metastatic lesions.  She does not have a primary care doctor and is scheduled to follow-up with oncology on 06/25/2023.  Patient was discharged with oxycodone 5 mg.  She has been taking Tylenol and ibuprofen and states that her pain is intolerable whenever she tries to move or stand especially in her lumbar and left low back region.  Patient states at rest her pain is about 5 out of 10.  She is here for increased pain control.  She denies red flag symptoms.   Back Pain      Home Medications Prior to Admission medications   Medication Sig Start Date End Date Taking? Authorizing Provider  aspirin EC 81 MG tablet Take 81 mg by mouth daily.    [provider]  carvedilol (COREG CR) 10 MG 24 hr capsule Take 1 capsule (10 mg total) by mouth daily. 11/26/14   Barnetta Chapel, PA-C  COVID-19 mRNA bivalent vaccine, Pfizer, (PFIZER COVID-19 VAC BIVALENT) injection Inject into the muscle. 09/07/21   Judyann Munson, MD  Garlic 1000 MG CAPS Take 1,000 mg by mouth daily.    [provider]  hydrochlorothiazide (HYDRODIURIL) 25 MG tablet Take 25 mg by mouth daily.    [provider]  HYDROcodone-acetaminophen (NORCO) 5-325 MG tablet Take 1-2 tablets by mouth every 6 (six) hours as needed. 12/15/15   Geoffery Lyons, MD  Multiple Vitamins-Minerals (CENTRUM ADULTS PO) Take 1 tablet by mouth.     [provider]  Omega 3 1200 MG CAPS Take 1,200 mg by mouth daily.    [provider]  ondansetron (ZOFRAN) 4 MG tablet Take 1 tablet (4 mg total) by mouth every 8 (eight) hours as needed for up to 5 days for nausea or vomiting. 06/11/23 06/16/23  Valrie Hart F, PA-C  psyllium (REGULOID) 0.52 G capsule Take 2.08 g by mouth daily.    [provider]  Vitamin D, Cholecalciferol, 1000 UNITS CAPS Take 200 mg by mouth daily.    [provider]      Allergies    Penicillins and Codeine    Review of Systems   Review of Systems  Musculoskeletal:  Positive for back pain.    Physical Exam Updated Vital Signs BP (!) 176/93 (BP Location: Right Arm)   Pulse 99   Temp 98.3 F (36.8 C)   Resp 16   Ht 5\' 7"  (1.702 m)   Wt 83 kg   SpO2 98%   BMI 28.66 kg/m  Physical Exam Vitals and nursing note reviewed.  Constitutional:      General: She is not in acute distress.    Appearance: She is well-developed. She is not diaphoretic.  HENT:     Head: Normocephalic and atraumatic.     Right Ear: External ear normal.     Left Ear: External ear normal.     Nose: Nose normal.  Mouth/Throat:     Mouth: Mucous membranes are moist.  Eyes:     General: No scleral icterus.    Conjunctiva/sclera: Conjunctivae normal.  Cardiovascular:     Rate and Rhythm: Normal rate and regular rhythm.     Heart sounds: Normal heart sounds. No murmur heard.    No friction rub. No gallop.  Pulmonary:     Effort: Pulmonary effort is normal. No respiratory distress.     Breath sounds: Normal breath sounds.  Abdominal:     General: Bowel sounds are normal. There is no distension.     Palpations: Abdomen is soft. There is no mass.     Tenderness: There is no abdominal tenderness. There is no guarding.  Musculoskeletal:     Cervical back: Normal range of motion.  Skin:    General: Skin is warm and dry.  Neurological:     Mental Status: She is alert and oriented to person,  place, and time.  Psychiatric:        Behavior: Behavior normal.     ED Results / Procedures / Treatments   Labs (all labs ordered are listed, but only abnormal results are displayed) Labs Reviewed - No data to display  EKG None  Radiology No results found.  Procedures Procedures    Medications Ordered in ED Medications - No data to display  ED Course/ Medical Decision Making/ A&P Clinical Course as of 06/15/23 1715  Sat Jun 15, 2023  1530 Case discussed with Dr. [name]. I reviewed the patient's previous lab work with him.  Based on current lab evaluations of workup for multiple myeloma it is unlikely that this is her primary diagnosis.  Review of chest CT shows potential for lymphangitic spread of carcinoma in the lungs.  She does have a pleural effusion.  Patient reports she has had a cough for about a month and a half.  Dr. [name] is currently recommending admission for expedited workup, palliative care consult for pain control and oncology will consult.  Patient is in agreement with plan. [AH]    Clinical Course User Index [AH] Arthor Captain, PA-C                                 Medical Decision Making Patient here with complaint of severe pain.  Amount and/or Complexity of Data Reviewed Labs: ordered.  Risk Prescription drug management. Decision regarding hospitalization.  67 year old female who presents emergency department for evaluation of severe pain.  She was seen 4 days ago noted to have lytic lesions and multiple bony regions.  Patient had a multiple myeloma screening done.  I reviewed these labs from previous visit with Dr. [name].  Unlikely that multiple myeloma as the diagnosis as she has negative M spike.  In reviewing previous CT images of the chest she does have bilateral pleural effusions and potential primary diagnosis of lung cancer.  I discussed the case with Dr. [name] as above.  I have also discussed the case with Dr. Rhona Leavens he will admit the  patient to the hospital.  I reviewed the patient's labs which show no acute findings today.  Patient given pain medication.  Her blood pressure is elevated which she states is due to "whitecoat syndrome."  She is asymptomatic otherwise other than her pain.  She is safe for admission by POV.         Final Clinical Impression(s) / ED Diagnoses Final diagnoses:  None  Rx / DC Orders ED Discharge Orders     None         Arthor Captain, PA-C 06/15/23 1718    Terrilee Files, MD 06/16/23 1020

## 2023-06-15 NOTE — Assessment & Plan Note (Signed)
-  pt presented with left flank pain earlier this week and found to have numerous lytic lesions to bilateral ribs, thoracic spine, bilateral scapular and manubrium. There is also new bilateral pleural effusion with possible lymphangitic spread of tumor to the lungs. -There was initial concern for mulitple myeloma but no M spike seen  -Although she does not have any changes in bowel pattern or GI symptoms she does have hx of sigmoid mass s/p colectomy. Will obtain CEA.  -No notable nodules felt on breast exam and reports normal mammogram a few years ago although we do not have records  -No family hx of malignancy -oncology Dr. [name] consulted and appreciates further recommendations -pain control overnight with PRN oxycodone-acetaminophen q4hr and IV morphine

## 2023-06-15 NOTE — Assessment & Plan Note (Signed)
-  Uncontrolled -pt reports being non-compliant with Coreg and only takes it occasionally. Will resume and may need to add amlodipine -PRN IV labetalol with perimeters overnight

## 2023-06-15 NOTE — Plan of Care (Signed)
  Problem: Health Behavior/Discharge Planning: Goal: Ability to manage health-related needs will improve Outcome: Progressing   Problem: Activity: Goal: Risk for activity intolerance will decrease Outcome: Progressing   Problem: Nutrition: Goal: Adequate nutrition will be maintained Outcome: Progressing   Problem: Coping: Goal: Level of anxiety will decrease Outcome: Progressing   Problem: Elimination: Goal: Will not experience complications related to urinary retention Outcome: Progressing   

## 2023-06-15 NOTE — ED Triage Notes (Signed)
Pt reports pain medication she received (rx) from visit on 10/1 is not helping; back pain is returning before it is time for another dose;  oncology appt is 10/15

## 2023-06-15 NOTE — H&P (Signed)
History and Physical    Patient: Karina White NWG:[id number] DOB: 10-Mar-1956 DOA: 06/15/2023 DOS: the patient was seen and examined on 06/15/2023 PCP: Pcp, No  Patient coming from: Home  Chief Complaint:  Chief Complaint  Patient presents with   Back Pain   HPI: Karina White is a 67 y.o. female with medical history significant of HTN, GI bleed, sigmoid mass s/p left colectomy (2016) who presents as a transfer from outside ED with worsening pain back.     Pt presented to ED on 10/1 with concern for left flank pain 3 days prior. Pain starts in left middle thoracic and radiates around to anterior LUQ. CT chest/abd/pelvis revealing for numerous lytic lesions to bilateral ribs, thoracic spine, bilateral scapular and manubrium. Oncologist Dr. Al Pimple was consulted to arrange outpatient follow up. She was provided with narcotics for pain control and discharged.   She returns to ED today with uncontrolled pain. Says oxycodone was prescribed for every 8 hours but she was only getting relieve for 6 hours although it seems she was only taking it 2 times a day. Even with the medication her pain do not completely subside but is tolerable. She reports a month of dry cough but no shortness of breath or chest pain. Tat the onset of her left sided pain last week she had several episodes of vomiting. Had vomiting again 4 days ago that has resolved. No diarrhea or constipation. No changes in bowel habits. 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 Reports last colonoscopy in 2016 which is what Epic has on record. Also reports a normal mammogram sometime after the pandemic. She does self breast exams and has not noted any nodules, nipple discharge or skin discoloration. She reports weight loss but has been intentional with diet and daughter at bedside reports could be due to family stress. [name] passed away a few years ago. Pt has never used tobacco products or illicit  drugs.  No family hx of malignancy. She still works part time as a Secretary/administrator Dr. [name] was consulted by ED PA and review her previous lab work for workup for multiple myeloma and felt unlikely that it was her primary diagnosis.  Dr. [name] is currently recommending admission for expedited workup, palliative care consult for pain control and oncology will consult.  Review of Systems: As mentioned in the history of present illness. All other systems reviewed and are negative. Past Medical History:  Diagnosis Date   Hypertension    Past Surgical History:  Procedure Laterality Date   ABDOMINAL HYSTERECTOMY  1980's   CESAREAN SECTION     PARTIAL COLECTOMY N/A 11/23/2014   Procedure: LAPAROSCOPIC CONVERTED TO OPEN PARTIAL COLECTOMY;  Surgeon: Abigail Miyamoto, MD;  Location: [facility] OR;  Service: General;  Laterality: N/A;   Social History:  reports that she has never smoked. She does not have any smokeless tobacco history on file. She reports that she does not drink alcohol and does not use drugs.  Allergies  Allergen Reactions   Penicillins Hives and Itching   Codeine Nausea And Vomiting    History reviewed. No pertinent family history.  Prior to Admission medications   Medication Sig Start Date End Date Taking? Authorizing Provider  aspirin EC 81 MG tablet Take 81 mg by mouth daily.    [provider]  carvedilol (COREG CR) 10 MG 24 hr capsule Take 1 capsule (10 mg total) by mouth daily. 11/26/14   Barnetta Chapel,  PA-C  COVID-19 mRNA bivalent vaccine, Pfizer, (PFIZER COVID-19 VAC BIVALENT) injection Inject into the muscle. 09/07/21   Judyann Munson, MD  Garlic 1000 MG CAPS Take 1,000 mg by mouth daily.    [provider]  hydrochlorothiazide (HYDRODIURIL) 25 MG tablet Take 25 mg by mouth daily.    [provider]  HYDROcodone-acetaminophen (NORCO) 5-325 MG tablet Take 1-2 tablets by mouth every 6 (six) hours as needed. 12/15/15   Geoffery Lyons, MD   Multiple Vitamins-Minerals (CENTRUM ADULTS PO) Take 1 tablet by mouth.    [provider]  Omega 3 1200 MG CAPS Take 1,200 mg by mouth daily.    [provider]  ondansetron (ZOFRAN) 4 MG tablet Take 1 tablet (4 mg total) by mouth every 8 (eight) hours as needed for up to 5 days for nausea or vomiting. 06/11/23 06/16/23  Valrie Hart F, PA-C  psyllium (REGULOID) 0.52 G capsule Take 2.08 g by mouth daily.    [provider]  Vitamin D, Cholecalciferol, 1000 UNITS CAPS Take 200 mg by mouth daily.    [provider]    Physical Exam: Vitals:   06/15/23 1705 06/15/23 1730 06/15/23 2001 06/15/23 2009  BP: (!) 203/99 (!) 173/84  (!) 176/92  Pulse: 80 80  (!) 114  Resp: 16 18  (!) 24  Temp: 98.7 F (37.1 C) 98.7 F (37.1 C)  98.4 F (36.9 C)  TempSrc: Oral Oral  Oral  SpO2: 95% 95%  96%  Weight:   79.5 kg   Height:   5\' 7"  (1.702 m)    Constitutional: NAD, calm, comfortable, well appearing female appearing younger than stated age laying in bed Eyes:  lids and conjunctivae normal ENMT: Mucous membranes are moist.  Neck: normal, supple, no masses, bilateral anterior cervical lymphadenopathy Respiratory: clear to auscultation bilaterally, no wheezing, no crackles. Normal respiratory effort. No accessory muscle use.  Cardiovascular: Regular rate and rhythm, no murmurs / rubs / gallops. No extremity edema.  Abdomen: soft, no tenderness, no masses palpated.  No CVA tenderness. Bowel sounds positive.  Breast: no discoloration of the skin, no obvious deformities, no nipple discharge or retractions, no nodules or lesions felt bilaterally Musculoskeletal: no clubbing / cyanosis. No joint deformity upper and lower extremities. Good ROM, no contractures. Normal muscle tone.  Skin: no rashes, lesions, ulcers. No induration Neurologic: CN 2-12 grossly intact.  Strength 5/5 in all 4.  Psychiatric: Normal judgment and insight. Alert and oriented x 3. Normal mood.    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 There is also new bilateral pleural effusion with possible lymphangitic spread of tumor to the lungs. -There was initial concern for mulitple myeloma but no M spike seen  -Although she does not have any changes in bowel pattern or GI symptoms she does have hx of sigmoid mass s/p colectomy. Will obtain CEA.  -No notable nodules felt on breast exam and reports normal mammogram a few years ago although we do not have records  -No family hx of malignancy -oncology Dr. [name] consulted and appreciates further recommendations -pain control overnight with PRN oxycodone-acetaminophen q4hr and IV morphine   Essential hypertension -Uncontrolled -pt reports being non-compliant with Coreg and only takes it occasionally. Will resume and may need to add amlodipine -PRN IV labetalol with perimeters overnight  Advance Care Planning:   Code Status: Full Code   Consults: oncology Dr. [name]  Family Communication: daughter and sister at bedside  Severity of Illness: The appropriate patient status for this patient is INPATIENT. Inpatient status is judged to be reasonable and necessary in order to provide the required intensity of service to ensure the patient's safety. The patient's presenting symptoms, physical exam findings, and initial radiographic and laboratory data in the context of their chronic comorbidities is felt to place them at high risk for further clinical deterioration. Furthermore, it is not anticipated that the patient will be medically stable for discharge from the hospital within 2 midnights of admission.   * I certify that at the point of admission it is my clinical judgment that the patient will require inpatient hospital care spanning  beyond 2 midnights from the point of admission due to high intensity of service, high risk for further deterioration and high frequency of surveillance required.*  Author: Anselm Jungling, DO 06/15/2023 9:59 PM  For on call review [email or web address].

## 2023-06-16 ENCOUNTER — Inpatient Hospital Stay (HOSPITAL_COMMUNITY): Payer: Medicare Other

## 2023-06-16 DIAGNOSIS — M549 Dorsalgia, unspecified: Secondary | ICD-10-CM | POA: Diagnosis not present

## 2023-06-16 DIAGNOSIS — R7402 Elevation of levels of lactic acid dehydrogenase (LDH): Secondary | ICD-10-CM | POA: Diagnosis not present

## 2023-06-16 DIAGNOSIS — M899 Disorder of bone, unspecified: Secondary | ICD-10-CM

## 2023-06-16 DIAGNOSIS — C7951 Secondary malignant neoplasm of bone: Secondary | ICD-10-CM

## 2023-06-16 DIAGNOSIS — G893 Neoplasm related pain (acute) (chronic): Secondary | ICD-10-CM | POA: Diagnosis not present

## 2023-06-16 DIAGNOSIS — J90 Pleural effusion, not elsewhere classified: Secondary | ICD-10-CM

## 2023-06-16 DIAGNOSIS — I10 Essential (primary) hypertension: Secondary | ICD-10-CM

## 2023-06-16 LAB — CBC
HCT: 35.1 % — ABNORMAL LOW (ref 36.0–46.0)
Hemoglobin: 11.2 g/dL — ABNORMAL LOW (ref 12.0–15.0)
MCH: 24.9 pg — ABNORMAL LOW (ref 26.0–34.0)
MCHC: 31.9 g/dL (ref 30.0–36.0)
MCV: 78 fL — ABNORMAL LOW (ref 80.0–100.0)
Platelets: 359 10*3/uL (ref 150–400)
RBC: 4.5 MIL/uL (ref 3.87–5.11)
RDW: 15.9 % — ABNORMAL HIGH (ref 11.5–15.5)
WBC: 8.9 10*3/uL (ref 4.0–10.5)
nRBC: 0 % (ref 0.0–0.2)

## 2023-06-16 LAB — BASIC METABOLIC PANEL
Anion gap: 12 (ref 5–15)
BUN: 8 mg/dL (ref 8–23)
CO2: 25 mmol/L (ref 22–32)
Calcium: 8.6 mg/dL — ABNORMAL LOW (ref 8.9–10.3)
Chloride: 104 mmol/L (ref 98–111)
Creatinine, Ser: 0.85 mg/dL (ref 0.44–1.00)
GFR, Estimated: >60 mL/min (ref >60)
Glucose, Bld: 112 mg/dL — ABNORMAL HIGH (ref 70–99)
Potassium: 3.8 mmol/L (ref 3.5–5.1)
Sodium: 141 mmol/L (ref 135–145)

## 2023-06-16 LAB — HIV ANTIBODY (ROUTINE TESTING W REFLEX): HIV Screen 4th Generation wRfx: NONREACTIVE

## 2023-06-16 MED ORDER — SODIUM CHLORIDE 0.9 % IV SOLN
90.0000 mg | Freq: Once | INTRAVENOUS | Status: DC
Start: 2023-06-16 — End: 2023-06-16

## 2023-06-16 MED ORDER — ZOLEDRONIC ACID 4 MG/100ML IV SOLN
4.0000 mg | Freq: Once | INTRAVENOUS | Status: AC
  Administered 2023-06-16: 4 mg via INTRAVENOUS
  Filled 2023-06-16: qty 100

## 2023-06-16 MED ORDER — GADOBUTROL 1 MMOL/ML IV SOLN
7.0000 mL | Freq: Once | INTRAVENOUS | Status: AC | PRN
  Administered 2023-06-16: 7 mL via INTRAVENOUS

## 2023-06-16 MED ORDER — LIDOCAINE HCL 1 % IJ SOLN
INTRAMUSCULAR | Status: AC
  Filled 2023-06-16: qty 20

## 2023-06-16 NOTE — Consult Note (Signed)
Ms. Hefel is a very nice 67 year old African-American female.  She has a birthday coming up tomorrow.  I was actually notified about her when she came to the ER at [facility].  She apparently was seen a few days ago.  She is having a lot of pain.  She had scans done that showed extensive lytic lesions in her bones.  She was on pain medication but the oxycodone.  However, this has not been helping her.  She is still having quite a bit of pain and came back to the ER.  When she was seen back on 06/11/2023, there was a question of myeloma.  She had elevated LDH.  However, there was no monoclonal spike.  She had normal immunoglobulin levels.  Her Kappa light chain was minimally elevated.  On her CT scans, she did have a right pleural effusion.  She never smoked.  There is a history of cancer in the family.  She has not had a mammogram for several years.  There is no change in bowel or bladder habits.  She is try to lose little bit of weight.  Her CBC today shows a white cell count of 8.9.  Hemoglobin 11.2.  Platelet count 359,000.  MCV is only 78.  Her sodium 141.  Potassium 3.8.  BUN 8 creatinine 0.85.  Calcium 8.6.  She has had no obvious bleeding.  There is been no swollen lymph nodes.  She has had little bit of a cough.  There is been no obvious shortness of breath.   Her vital signs show temperature of 97.9.  Pulse 74.  Blood pressure 170/84.  Her head neck exam shows no ocular or oral lesions.  She has no adenopathy in the neck.  Lungs are clear bilaterally.  Cardiac exam regular rate and rhythm.  Abdomen is soft.  Bowel sounds are present.  There is no fluid wave.  There is no palpable liver or spleen tip.  Extremity shows no clubbing, cyanosis or edema.  Skin exam shows no rashes.  Neurological exam is nonfocal.   Ms. Trivedi is a very nice 67 year old African-American female.  She has lytic bone lesions.  For right now, is not clear at all as to what a primary might be.   I am not all that impressed with her Kappa light chain.  Maybe, she has a lot in her urine.  I will do a 24-hour urine on her.  I would also consider a MRI of her spine.  She may need to have a bone marrow biopsy done.  She has a moderate right pleural effusion.  This probably needs to be drained..  To have elevated LDH.  I suppose lymphoma is always a possibility.  In postmenopausal woman, breast cancer as well as a possibility.  I am glad that she was admitted.  Her workup can be done so we can more effectively.  I would probably get Palliative Care to try to help with pain management.  I am sure she will need to have Zometa or Aredia to help with her bones.  We will follow along closely.    Christin Bach, MD  Hebrews 12:12

## 2023-06-16 NOTE — Progress Notes (Signed)
PROGRESS NOTE    Karina White  NFA:[id number] DOB: 08-05-1956 DOA: 06/15/2023 PCP: Pcp, No   Brief Narrative:  HPI per Dr. Benita Gutter on 06/15/23  Karina White is a 67 y.o. female with medical history significant of HTN, GI bleed, sigmoid mass s/p left colectomy (2016) who presents as a transfer from outside ED with worsening pain back.      Pt presented to ED on 10/1 with concern for left flank pain 3 days prior. Pain starts in left middle thoracic and radiates around to anterior LUQ. CT chest/abd/pelvis revealing for numerous lytic lesions to bilateral ribs, thoracic spine, bilateral scapular and manubrium. Oncologist Dr. Al Pimple was consulted to arrange outpatient follow up. She was provided with narcotics for pain control and discharged.    She returns to ED today with uncontrolled pain. Says oxycodone was prescribed for every 8 hours but she was only getting relieve for 6 hours although it seems she was only taking it 2 times a day. Even with the medication her pain do not completely subside but is tolerable. She reports a month of dry cough but no shortness of breath or chest pain. Tat the onset of her left sided pain last week she had several episodes of vomiting. Had vomiting again 4 days ago that has resolved. No diarrhea or constipation. No changes in bowel habits. 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 Reports last colonoscopy in 2016 which is what [facility] has on record. Also reports a normal mammogram sometime after the pandemic. She does self breast exams and has not noted any nodules, nipple discharge or skin discoloration. She reports weight loss but has been intentional with diet and daughter at bedside reports could be due to family stress. [name] passed away a few years ago. Pt has never used tobacco products or illicit drugs.  No family hx of malignancy. She still works part time as a Secondary school teacher Dr. [name] was  consulted by ED PA and review her previous lab work for workup for multiple myeloma and felt unlikely that it was her primary diagnosis.  Dr. [name] is currently recommending admission for expedited workup, palliative care consult for pain control and oncology will consult.   **Interim History Oncology evaluated and recommending obtaining further workup for her lytic bone lesions and having to do a 24-hour urine on her as well as obtaining MRIs of her spine and a bone biopsy possibly.  They also recommended obtaining a thoracentesis given her moderate right pleural effusion however when she went to go and get it done she refused.  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 There is also new bilateral pleural effusion with possible lymphangitic spread of tumor to the lungs. -There was initial concern for mulitple myeloma but no M spike seen  -Although she does not have any changes in bowel pattern or GI symptoms she does have hx of sigmoid mass s/p colectomy. Will obtain CEA.  -No notable nodules felt on breast exam and reports normal mammogram a few years ago although we do not have records  -No family hx of malignancy -Oncology Dr. [name] consulted and appreciates further recommendations and he is recommending MRI imaging as  well as a 24-hour urine on her as well as a thoracentesis and giving her Zometa -Pain control overnight with PRN Oxycodone-Acetaminophen q4hr and IV Morphine 1 mg every 3 as needed -T Spine MRI done and showed " Diffuse abnormal marrow signal and enhancement consistent with extensive malignancy. This may represent metastatic disease or multiple myeloma. No pathologic fractures. Mild right foraminal narrowing  at T9-10 and T10-11. Large bilateral pleural effusions, right greater than left." -L Spine MRI done and showed "Innumerable enhancing lesions throughout the marrow spaces consistent with diffuse osseous metastatic disease. No pathologic fractures. Mild disc disease and facet hypertrophy as described without significant stenosis. 2.5 cm simple cyst in the left kidney. No follow-up imaging is recommended." -Oncology is ordering a UPEP/U IFE/light chains/TP and a 24-hour urine -She is also given 4 mg of Zoledronic acid -Oncology is also recommending obtaining a possible bone marrow biopsy as well as palliative involvement for help with pain management  Moderate right-sided pleural effusion -Oncology consulted and recommending a thoracentesis and patient was agreeable initially but then refused   Essential Hypertension -Uncontrolled -pt reports being non-compliant with Coreg and only takes it occasionally. -Will resume and may need to add amlodipine -PRN IV labetalol with perimeters overnight  -Continue to Monitor blood pressures per protocol; Last BP reading was    Microcytic Anemia likely of Chronic Disease -Hgb/Hct Trend: Recent Labs  Lab 06/11/23 1229 06/15/23 1531 06/16/23 0517  HGB 12.5 12.2 11.2*  HCT 39.3 38.5 35.1*  -Check Anemia Panel in the AM -Continue to Monitor for S/Sx of Bleeding; No overt bleeding noted -Repeat CBC in the AM  Hypoalbuminemia -Patient's Albumin Trend: Recent Labs  Lab 06/11/23 1813  ALBUMIN 3.2*  -Continue to Monitor and Trend and repeat CMP in the AM   DVT prophylaxis: enoxaparin (LOVENOX) injection 40 mg Start: 06/15/23 2230    Code Status: Full Code Family Communication: No family present at bedside  Disposition Plan:  Level of care: Telemetry Status is: Inpatient Remains inpatient appropriate because: Needs further clinical improvement and further workup   Consultants:  Medical oncology Palliative care medicine  Procedures:  As  delineated as above  Antimicrobials:  Anti-infectives (From admission, onward)    None       Subjective: Seen and examined at bedside and she was doing okay but still complaining some pain.  Had a lot of questions about what was going on.  No other concerns requested this time but subsequently refused for thoracentesis as she ordered discussed with her daughter.  Objective: Vitals:   06/16/23 1004 06/16/23 1031 06/16/23 1441 06/16/23 1937  BP: (!) 158/97  (!) 148/71 (!) 148/85  Pulse: (!) 101  84 72  Resp: 19 (!) 25  18  Temp: 98.4 F (36.9 C)  97.9 F (36.6 C) 99.2 F (37.3 C)  TempSrc: Oral  Oral Oral  SpO2: 95%  97% 94%  Weight:      Height:        Intake/Output Summary (Last 24 hours) at 06/16/2023 2005 Last data filed at 06/16/2023 1700 Gross per 24 hour  Intake 1040 ml  Output 400 ml  Net 640 ml   Filed Weights   06/15/23 1233 06/15/23 2001  Weight: 83 kg 79.5 kg   Examination: Physical Exam:  Constitutional: WN/WD overweight chronically ill-appearing African-American female who appears calm Respiratory: Diminished to auscultation bilaterally with coarse breath sounds, no wheezing, rales, rhonchi or crackles. Normal respiratory effort and patient is not tachypenic. No accessory muscle use.  Unlabored breathing Cardiovascular: RRR, no murmurs / rubs / gallops. S1 and S2 auscultated. No extremity edema.  Abdomen: Soft, non-tender, slightly distended secondary to habitus. Bowel sounds positive.  GU: Deferred. Musculoskeletal: No clubbing / cyanosis of digits/nails. No joint deformity upper and lower extremities. Skin: No rashes, lesions, ulcers on limited skin evaluation. No induration; Warm and dry.  Neurologic: CN 2-12 grossly intact with no focal deficits. Romberg sign and cerebellar reflexes not assessed.  Psychiatric: Normal judgment and insight. Alert and oriented x 3.  She is a little anxious  Data Reviewed: I have personally reviewed following labs and  imaging studies  CBC: Recent Labs  Lab 06/11/23 1229 06/15/23 1531 06/16/23 0517  WBC 7.8 10.4 8.9  HGB 12.5 12.2 11.2*  HCT 39.3 38.5 35.1*  MCV 78.0* 77.2* 78.0*  PLT 439* 378 359   Basic Metabolic Panel: Recent Labs  Lab 06/11/23 1229 06/15/23 1531 06/16/23 0517  NA 140 140 141  K 3.4* 3.7 3.8  CL 101 102 104  CO2 25 23 25   GLUCOSE 104* 109* 112*  BUN 12 9 8   CREATININE 0.96 0.86 0.85  CALCIUM 9.0 9.2 8.6*   GFR: Estimated Creatinine Clearance: 68.8 mL/min (by C-G formula based on SCr of 0.85 mg/dL). Liver Function Tests: Recent Labs  Lab 06/11/23 1813  AST 35  ALT 18  ALKPHOS 167*  BILITOT 0.5  PROT 8.0  ALBUMIN 3.2*   Recent Labs  Lab 06/11/23 1229  LIPASE 28   No results for input(s): "AMMONIA" in the last 168 hours. Coagulation Profile: No results for input(s): "INR", "PROTIME" in the last 168 hours. Cardiac Enzymes: No results for input(s): "CKTOTAL", "CKMB", "CKMBINDEX", "TROPONINI" in the last 168 hours. BNP (last 3 results) No results for input(s): "PROBNP" in the last 8760 hours. HbA1C: No results for input(s): "HGBA1C" in the last 72 hours. CBG: No results for input(s): "GLUCAP" in the last 168 hours. Lipid Profile: No results for input(s): "CHOL", "HDL", "LDLCALC", "TRIG", "CHOLHDL", "LDLDIRECT" in the last 72 hours. Thyroid Function Tests: No results for input(s): "TSH", "T4TOTAL", "FREET4", "T3FREE", "THYROIDAB" in the last 72 hours. Anemia Panel: No results for input(s): "VITAMINB12", "FOLATE", "FERRITIN", "TIBC", "IRON", "RETICCTPCT" in the last 72 hours. Sepsis Labs: No results for input(s): "PROCALCITON", "LATICACIDVEN" in the last 168 hours.  No results found for this or any previous visit (from the past 240 hour(s)).   Radiology Studies: MR THORACIC SPINE W WO CONTRAST  Result Date: 06/16/2023 CLINICAL DATA:  Abnormal CT.  Diffuse spinal metastases. EXAM: MRI THORACIC WITHOUT AND WITH CONTRAST TECHNIQUE: Multiplanar and  multiecho pulse sequences of the thoracic spine were obtained without and with intravenous contrast. CONTRAST:  7mL GADAVIST GADOBUTROL 1 MMOL/ML IV SOLN COMPARISON:  CT chest without contrast 06/11/2023. FINDINGS: Alignment:  No significant listhesis is present. Vertebrae: Diffuse abnormal marrow signal and enhancement is consistent with extensive malignancy vertebral body heights are maintained. No pathologic fractures are present. No extraosseous enhancement or expansion of the bone is present. Cord:  Normal signal and morphology. Paraspinal and other soft tissues: Large bilateral pleural effusions are present, right greater than left. Paraspinous soft tissues are otherwise unremarkable. Disc levels: A left paramedian disc protrusion is present at T6-7 without significant stenosis. Broad-based disc protrusions at T8-9, T9-10 and T10-11 partially effaces the ventral CSF without significant stenosis at these levels. Facet hypertrophy contributes to mild right foraminal narrowing at T9-10 and T10-11. The foramina are otherwise patent bilaterally. IMPRESSION: 1. Diffuse abnormal marrow signal and enhancement consistent  with extensive malignancy. This may represent metastatic disease or multiple myeloma. 2. No pathologic fractures. 3. Mild right foraminal narrowing at T9-10 and T10-11. 4. Large bilateral pleural effusions, right greater than left. Electronically Signed   By: Marin Roberts M.D.   On: 06/16/2023 14:04   MR Lumbar Spine W Wo Contrast  Result Date: 06/16/2023 CLINICAL DATA:  Musculoskeletal neoplasm.  Abnormal CT scan. EXAM: MRI LUMBAR SPINE WITHOUT AND WITH CONTRAST TECHNIQUE: Multiplanar and multiecho pulse sequences of the lumbar spine were obtained without and with intravenous contrast. CONTRAST:  7mL GADAVIST GADOBUTROL 1 MMOL/ML IV SOLN COMPARISON:  CT of the abdomen pelvis with contrast 06/11/2023. FINDINGS: Segmentation: A partially sacralized transitional L5 segment is noted. Four  other non rib-bearing lumbar type vertebral bodies are present. The lowest fully formed vertebral body is L5. Alignment: No significant listhesis is present. Lumbar lordosis is normal. Vertebrae: Vertebral body heights are normal. Diffuse infiltrative disease is present throughout the marrow spaces. Innumerable enhancing lesions are present without expansion of the bone. No pathologic fractures are present. Conus medullaris and cauda equina: Conus extends to the L1 level. Conus and cauda equina appear normal. Paraspinal and other soft tissues: A 2.5 cm simple cyst is present at the upper pole of the left kidney. No other focal lesions are present. No significant adenopathy is present. Disc levels: T12-L1: Normal disc signal and height is present. No focal protrusion or stenosis is present. L1-2: Normal disc signal and height is present. No focal protrusion or stenosis is present. L2-3: A bright were disc protrusion is present. Mild facet hypertrophy is present bilaterally. No significant stenosis is present. L3-4: A broad-based disc protrusion is asymmetric to the left. Mild facet hypertrophy is noted bilaterally. No focal stenosis is present. L4-5: Chronic loss of disc height present. A mild disc protrusion extends into the foramina bilaterally. No significant stenosis is present. L5-S1: Normal disc signal and height is present. No focal protrusion or stenosis is present. IMPRESSION: 1. Innumerable enhancing lesions throughout the marrow spaces consistent with diffuse osseous metastatic disease. 2. No pathologic fractures. 3. Mild disc disease and facet hypertrophy as described without significant stenosis. 4. 2.5 cm simple cyst in the left kidney. No follow-up imaging is recommended. [employer] 2021; [id number], Bosniak Classification of Cystic Renal Masses, Version 2019. Electronically Signed   By: Marin Roberts M.D.   On: 06/16/2023 13:53    Scheduled Meds:  aspirin EC  81 mg Oral Daily   carvedilol   10 mg Oral Daily   enoxaparin (LOVENOX) injection  40 mg Subcutaneous Q24H   Continuous Infusions:   LOS: 1 day   Marguerita Merles, DO [facility] Available via Epic secure chat 7am-7pm After these hours, please refer to coverage provider listed on [email or web address] 06/16/2023, 8:05 PM

## 2023-06-16 NOTE — Hospital Course (Signed)
HPI per Dr. Benita Gutter on 06/15/23 Karina White is a 67 y.o. female with medical history significant of HTN, GI bleed, sigmoid mass s/p left colectomy (2016) who presents as a transfer from outside ED with worsening pain back.      Pt presented to ED on 10/1 with concern for left flank pain 3 days prior. Pain starts in left middle thoracic and radiates around to anterior LUQ. CT chest/abd/pelvis revealing for numerous lytic lesions to bilateral ribs, thoracic spine, bilateral scapular and manubrium. Oncologist Dr. Al Pimple was consulted to arrange outpatient follow up. She was provided with narcotics for pain control and discharged.    She returns to ED today with uncontrolled pain. Says oxycodone was prescribed for every 8 hours but she was only getting relieve for 6 hours although it seems she was only taking it 2 times a day. Even with the medication her pain do not completely subside but is tolerable. She reports a month of dry cough but no shortness of breath or chest pain. Tat the onset of her left sided pain last week she had several episodes of vomiting. Had vomiting again 4 days ago that has resolved. No diarrhea or constipation. No changes in bowel habits. 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 Reports last colonoscopy in 2016 which is what Epic has on record. Also reports a normal mammogram sometime after the pandemic. She does self breast exams and has not noted any nodules, nipple discharge or skin discoloration. She reports weight loss but has been intentional with diet and daughter at bedside reports could be due to family stress. [name] passed away a few years ago. Pt has never used tobacco products or illicit drugs.  No family hx of malignancy. She still works part time as a Secondary school teacher Dr. [name] was consulted by ED PA and review her previous lab work for workup for multiple myeloma and felt unlikely that it was her primary  diagnosis.  Dr. [name] is currently recommending admission for expedited workup, palliative care consult for pain control and oncology will consult.   **Interim History Oncology evaluated and recommending obtaining further workup for her lytic bone lesions and having to do a 24-hour urine on her as well as obtaining MRIs of her spine and a bone biopsy possibly.  They also recommended obtaining a thoracentesis given her moderate right pleural effusion however when she went to go and get it done she refused.  Palliative care has been consulted to help with pain management and she is also been given Zometa  She is currently undergoing further workup and ended up getting a bone marrow biopsy and thoracentesis today.  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 There is also new bilateral pleural effusion with possible lymphangitic spread of tumor to the lungs. -There was initial concern for mulitple myeloma but no M spike seen  -Although she does not have any changes in bowel pattern or GI symptoms she does have hx of sigmoid mass s/p colectomy. Will obtain CEA.  -No notable nodules felt on breast exam and reports normal mammogram a few years ago although we do not have records  -No family hx of malignancy -Oncology Dr. [name] consulted and appreciates further recommendations and he is recommending MRI imaging as well as a 24-hour  urine on her as well as a thoracentesis and giving her Zometa -Pain control overnight with PRN Oxycodone-Acetaminophen q4hr and IV Morphine 1 mg every 3 as needed -T Spine MRI done and showed " Diffuse abnormal marrow signal and enhancement consistent with extensive malignancy. This may represent metastatic disease or multiple myeloma. No pathologic fractures. Mild right foraminal narrowing at T9-10 and  T10-11. Large bilateral pleural effusions, right greater than left." -L Spine MRI done and showed "Innumerable enhancing lesions throughout the marrow spaces consistent with diffuse osseous metastatic disease. No pathologic fractures. Mild disc disease and facet hypertrophy as described without significant stenosis. 2.5 cm simple cyst in the left kidney. No follow-up imaging is recommended." -Oncology is ordering a UPEP/U IFE/light chains/TP and a 24-hour urine and this is pending  -She is also given 4 mg of Zoledronic acid -Oncology is also recommending obtaining a possible bone marrow biopsy which was done today as well as palliative involvement for help with pain management  Fever -Unclear Etiology but post Bone Marrow Bx and Thoracentesis and could be related to Above -Spiked a Temp of 101.1 -If Re-occurs will Pan-Cx -DG Chest done and showed " No evidence of pneumothorax or other complication following right-sided thoracentesis. Decreased right pleural effusion."    Moderate Right-sided pleural effusion -Oncology consulted and recommending a thoracentesis and patient was agreeable initially but then refused yesterday but is agreeable today and underwent it this Afternoon and it was successful ultrasound guided diagnostic and therapeutic right thoracentesis yielding 770 cc of pleural fluid.   Essential Hypertension -Uncontrolled -Pt reports being non-compliant with Coreg and only takes it occasionally. -Carvedilol has been resumed at 10 mg p.o. daily however will also add amlodipine 10 mg p.o. daily and she also has labetalol 5 mg IV every 2.  For high blood pressure for systolic greater than 180  -Continue to Monitor blood pressures per protocol; Last BP reading was elevated 166/79   Microcytic Anemia likely of Chronic Disease -Hgb/Hct Trend: Recent Labs  Lab 06/11/23 1229 06/15/23 1531 06/16/23 0517 06/17/23 0444  HGB 12.5 12.2 11.2* 12.4  HCT 39.3 38.5 35.1* 39.6  -Checked  Anemia Panel and it showed an iron level of 21, UIBC 218, TIBC of 239, saturation ratios of 9%, ferritin level 680, folate level 8.6, vitamin B12 level of 773 -Hold off Iron Infusion given temp of 101.1 -Check FOBT -Continue to Monitor for S/Sx of Bleeding; No overt bleeding noted -Repeat CBC in the AM  Hypoalbuminemia -Patient's Albumin Trend: Recent Labs  Lab 06/11/23 1813 06/17/23 0444  ALBUMIN 3.2* 2.8*  -Continue to Monitor and Trend and repeat CMP in the AM

## 2023-06-17 ENCOUNTER — Inpatient Hospital Stay (HOSPITAL_COMMUNITY): Payer: Medicare Other

## 2023-06-17 DIAGNOSIS — C7951 Secondary malignant neoplasm of bone: Secondary | ICD-10-CM | POA: Diagnosis not present

## 2023-06-17 DIAGNOSIS — M549 Dorsalgia, unspecified: Secondary | ICD-10-CM | POA: Diagnosis not present

## 2023-06-17 DIAGNOSIS — I10 Essential (primary) hypertension: Secondary | ICD-10-CM | POA: Diagnosis not present

## 2023-06-17 DIAGNOSIS — G893 Neoplasm related pain (acute) (chronic): Secondary | ICD-10-CM | POA: Diagnosis not present

## 2023-06-17 LAB — MAGNESIUM: Magnesium: 2.3 mg/dL (ref 1.7–2.4)

## 2023-06-17 LAB — CBC WITH DIFFERENTIAL/PLATELET
Abs Immature Granulocytes: 0.04 10*3/uL (ref 0.00–0.07)
Basophils Absolute: 0 10*3/uL (ref 0.0–0.1)
Basophils Relative: 0 %
Eosinophils Absolute: 0.1 10*3/uL (ref 0.0–0.5)
Eosinophils Relative: 1 %
HCT: 39.6 % (ref 36.0–46.0)
Hemoglobin: 12.4 g/dL (ref 12.0–15.0)
Immature Granulocytes: 0 %
Lymphocytes Relative: 11 %
Lymphs Abs: 1 10*3/uL (ref 0.7–4.0)
MCH: 24.7 pg — ABNORMAL LOW (ref 26.0–34.0)
MCHC: 31.3 g/dL (ref 30.0–36.0)
MCV: 78.9 fL — ABNORMAL LOW (ref 80.0–100.0)
Monocytes Absolute: 0.4 10*3/uL (ref 0.1–1.0)
Monocytes Relative: 4 %
Neutro Abs: 7.6 10*3/uL (ref 1.7–7.7)
Neutrophils Relative %: 84 %
Platelets: 389 10*3/uL (ref 150–400)
RBC: 5.02 MIL/uL (ref 3.87–5.11)
RDW: 15.9 % — ABNORMAL HIGH (ref 11.5–15.5)
WBC: 9.1 10*3/uL (ref 4.0–10.5)
nRBC: 0.2 % (ref 0.0–0.2)

## 2023-06-17 LAB — COMPREHENSIVE METABOLIC PANEL
ALT: 34 U/L (ref 0–44)
AST: 38 U/L (ref 15–41)
Albumin: 2.8 g/dL — ABNORMAL LOW (ref 3.5–5.0)
Alkaline Phosphatase: 191 U/L — ABNORMAL HIGH (ref 38–126)
Anion gap: 13 (ref 5–15)
BUN: 8 mg/dL (ref 8–23)
CO2: 26 mmol/L (ref 22–32)
Calcium: 8.9 mg/dL (ref 8.9–10.3)
Chloride: 103 mmol/L (ref 98–111)
Creatinine, Ser: 0.79 mg/dL (ref 0.44–1.00)
GFR, Estimated: >60 mL/min (ref >60)
Glucose, Bld: 117 mg/dL — ABNORMAL HIGH (ref 70–99)
Potassium: 4 mmol/L (ref 3.5–5.1)
Sodium: 142 mmol/L (ref 135–145)
Total Bilirubin: 0.7 mg/dL (ref 0.3–1.2)
Total Protein: 7.8 g/dL (ref 6.5–8.1)

## 2023-06-17 LAB — PHOSPHORUS: Phosphorus: 2.8 mg/dL (ref 2.5–4.6)

## 2023-06-17 LAB — IRON AND TIBC
Iron: 21 ug/dL — ABNORMAL LOW (ref 28–170)
Saturation Ratios: 9 % — ABNORMAL LOW (ref 10.4–31.8)
TIBC: 239 ug/dL — ABNORMAL LOW (ref 250–450)
UIBC: 218 ug/dL

## 2023-06-17 LAB — CEA
CEA: 14.2 ng/mL — ABNORMAL HIGH (ref 0.0–4.7)
CEA: 14.7 ng/mL — ABNORMAL HIGH (ref 0.0–4.7)

## 2023-06-17 LAB — RETICULOCYTES
Immature Retic Fract: 16.3 % — ABNORMAL HIGH (ref 2.3–15.9)
RBC.: 4.95 MIL/uL (ref 3.87–5.11)
Retic Count, Absolute: 47.5 10*3/uL (ref 19.0–186.0)
Retic Ct Pct: 1 % (ref 0.4–3.1)

## 2023-06-17 LAB — VITAMIN B12: Vitamin B-12: 773 pg/mL (ref 180–914)

## 2023-06-17 LAB — FOLATE: Folate: 8.6 ng/mL (ref >5.9)

## 2023-06-17 LAB — FERRITIN: Ferritin: 680 ng/mL — ABNORMAL HIGH (ref 11–307)

## 2023-06-17 MED ORDER — SODIUM CHLORIDE 0.9 % IV SOLN
INTRAVENOUS | Status: AC
  Filled 2023-06-17: qty 500

## 2023-06-17 MED ORDER — SODIUM CHLORIDE 0.9 % IV SOLN
250.0000 mg | Freq: Every day | INTRAVENOUS | Status: DC
  Administered 2023-06-18: 250 mg via INTRAVENOUS
  Filled 2023-06-17 (×2): qty 20

## 2023-06-17 MED ORDER — ACETAMINOPHEN 325 MG PO TABS
650.0000 mg | ORAL_TABLET | Freq: Four times a day (QID) | ORAL | Status: DC | PRN
  Administered 2023-06-17: 650 mg via ORAL
  Filled 2023-06-17: qty 2

## 2023-06-17 MED ORDER — MIDAZOLAM HCL 2 MG/2ML IJ SOLN
INTRAMUSCULAR | Status: AC | PRN
Start: 2023-06-17 — End: 2023-06-17
  Administered 2023-06-17: 1 mg via INTRAVENOUS

## 2023-06-17 MED ORDER — AMLODIPINE BESYLATE 5 MG PO TABS
10.0000 mg | ORAL_TABLET | Freq: Every day | ORAL | Status: DC
  Administered 2023-06-17 – 2023-06-18 (×2): 10 mg via ORAL
  Filled 2023-06-17 (×2): qty 2

## 2023-06-17 MED ORDER — LIDOCAINE HCL 1 % IJ SOLN
INTRAMUSCULAR | Status: AC
  Filled 2023-06-17: qty 20

## 2023-06-17 MED ORDER — ENOXAPARIN SODIUM 40 MG/0.4ML IJ SOSY
40.0000 mg | PREFILLED_SYRINGE | INTRAMUSCULAR | Status: DC
  Administered 2023-06-18: 40 mg via SUBCUTANEOUS
  Filled 2023-06-17: qty 0.4

## 2023-06-17 MED ORDER — FENTANYL CITRATE (PF) 100 MCG/2ML IJ SOLN
INTRAMUSCULAR | Status: AC
  Filled 2023-06-17: qty 2

## 2023-06-17 MED ORDER — MIDAZOLAM HCL 2 MG/2ML IJ SOLN
INTRAMUSCULAR | Status: AC
  Filled 2023-06-17: qty 2

## 2023-06-17 MED ORDER — FENTANYL CITRATE (PF) 100 MCG/2ML IJ SOLN
INTRAMUSCULAR | Status: AC | PRN
Start: 2023-06-17 — End: 2023-06-17
  Administered 2023-06-17: 50 ug via INTRAVENOUS

## 2023-06-17 NOTE — Progress Notes (Signed)
Pt with new fever post thoracentesis. IV Iron held per MD [facility]. Tylenol given, will recheck. Daughter called and updated of procedures being completed. Patient resting in bed with no needs at this time.

## 2023-06-17 NOTE — Consult Note (Signed)
Chief Complaint: Patient was seen in consultation today for CT-guided bone marrow biopsy Chief Complaint  Patient presents with   Back Pain    Referring Physician(s): Ennever,P  Supervising Physician: Simonne Come  Patient Status: [facility] - In-pt  History of Present Illness: Karina White is a 67 y.o. 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  Also known to have new bilateral pleural effusions with possible lymphangitic spread of tumor into the lungs.  Request now received from oncology for CT-guided bone marrow biopsy to exclude myeloma.  Past Medical History:  Diagnosis Date   Hypertension     Past Surgical History:  Procedure Laterality Date   ABDOMINAL HYSTERECTOMY  1980's   CESAREAN SECTION     PARTIAL COLECTOMY N/A 11/23/2014   Procedure: LAPAROSCOPIC CONVERTED TO OPEN PARTIAL COLECTOMY;  Surgeon: Abigail Miyamoto, MD;  Location: [facility] OR;  Service: General;  Laterality: N/A;    Allergies: Penicillins and Codeine  Medications: Prior to Admission medications   Medication Sig Start Date End Date Taking? Authorizing Provider  aspirin EC 81 MG tablet Take 81 mg by mouth daily.   Yes [provider]  carvedilol (COREG CR) 10 MG 24 hr capsule Take 1 capsule (10 mg total) by mouth daily. 11/26/14  Yes Barnetta Chapel, PA-C  Garlic 1000 MG CAPS Take 1,000 mg by mouth daily.   Yes [provider]  Multiple Vitamins-Minerals (CENTRUM ADULTS PO) Take 1 tablet by mouth.   Yes [provider]  Omega 3 1200 MG CAPS Take 1,200 mg by mouth daily.   Yes [provider]  oxyCODONE-acetaminophen (PERCOCET/ROXICET) 5-325 MG tablet Take 1 tablet by mouth every 4 (four) hours as needed for moderate pain.   Yes  [provider]  psyllium (REGULOID) 0.52 G capsule Take 2.08 g by mouth daily.   Yes [provider]  Vitamin D, Cholecalciferol, 1000 UNITS CAPS Take 200 mg by mouth daily.   Yes [provider]  COVID-19 mRNA bivalent vaccine, Pfizer, (PFIZER COVID-19 VAC BIVALENT) injection Inject into the muscle. 09/07/21   Judyann Munson, MD  hydrochlorothiazide (HYDRODIURIL) 25 MG tablet Take 25 mg by mouth daily. Patient not taking: Reported on 06/16/2023    [provider]     History reviewed. No pertinent family history.  Social History   Socioeconomic History   Marital status: Widowed    Spouse name: Not on file   Number of children: Not on file   Years of education: Not on file   Highest education level: Not on file  Occupational History   Not on file  Tobacco Use   Smoking status: Never   Smokeless tobacco: Not on file  Substance and Sexual Activity   Alcohol use: No   Drug use: No   Sexual activity: Not on file  Other Topics Concern   Not on file  Social History Narrative   Not on file   Social Determinants of Health   Financial Resource Strain: Not on file  Food Insecurity: No Food Insecurity (06/15/2023)   Hunger Vital Sign    Worried About Running Out of Food in the Last Year: Never true    Ran Out of Food in the Last Year: Never true  Transportation Needs: No Transportation Needs (06/15/2023)   PRAPARE - Transportation  Lack of Transportation (Medical): No    Lack of Transportation (Non-Medical): No  Physical Activity: Not on file  Stress: Not on file  Social Connections: Not on file      Review of Systems denies fever, headache, chest pain, abdominal/back pain, or bleeding.  Patient states she is just sleepy.  Does have some dyspnea with exertion.  Vital Signs: BP (!) 189/101   Pulse 93   Temp 97.7 F (36.5 C) (Oral)   Resp 18   Ht 5\' 7"  (1.702 m)   Wt 175 lb 4.3 oz (79.5 kg)   SpO2 94%   BMI 27.45 kg/m   Advance  Care Plan: No documents on file   Physical Exam awake, alert.  Chest with diminished breath sounds bilateral bases right greater than left.  Heart with tachycardic but regular rhythm.  Abdomen soft, positive bowel sounds, nontender.  No lower extremity edema.  Imaging: MR THORACIC SPINE W WO CONTRAST  Result Date: 06/16/2023 CLINICAL DATA:  Abnormal CT.  Diffuse spinal metastases. EXAM: MRI THORACIC WITHOUT AND WITH CONTRAST TECHNIQUE: Multiplanar and multiecho pulse sequences of the thoracic spine were obtained without and with intravenous contrast. CONTRAST:  7mL GADAVIST GADOBUTROL 1 MMOL/ML IV SOLN COMPARISON:  CT chest without contrast 06/11/2023. FINDINGS: Alignment:  No significant listhesis is present. Vertebrae: Diffuse abnormal marrow signal and enhancement is consistent with extensive malignancy vertebral body heights are maintained. No pathologic fractures are present. No extraosseous enhancement or expansion of the bone is present. Cord:  Normal signal and morphology. Paraspinal and other soft tissues: Large bilateral pleural effusions are present, right greater than left. Paraspinous soft tissues are otherwise unremarkable. Disc levels: A left paramedian disc protrusion is present at T6-7 without significant stenosis. Broad-based disc protrusions at T8-9, T9-10 and T10-11 partially effaces the ventral CSF without significant stenosis at these levels. Facet hypertrophy contributes to mild right foraminal narrowing at T9-10 and T10-11. The foramina are otherwise patent bilaterally. IMPRESSION: 1. Diffuse abnormal marrow signal and enhancement consistent with extensive malignancy. This may represent metastatic disease or multiple myeloma. 2. No pathologic fractures. 3. Mild right foraminal narrowing at T9-10 and T10-11. 4. Large bilateral pleural effusions, right greater than left. Electronically Signed   By: Marin Roberts M.D.   On: 06/16/2023 14:04   MR Lumbar Spine W Wo  Contrast  Result Date: 06/16/2023 CLINICAL DATA:  Musculoskeletal neoplasm.  Abnormal CT scan. EXAM: MRI LUMBAR SPINE WITHOUT AND WITH CONTRAST TECHNIQUE: Multiplanar and multiecho pulse sequences of the lumbar spine were obtained without and with intravenous contrast. CONTRAST:  7mL GADAVIST GADOBUTROL 1 MMOL/ML IV SOLN COMPARISON:  CT of the abdomen pelvis with contrast 06/11/2023. FINDINGS: Segmentation: A partially sacralized transitional L5 segment is noted. Four other non rib-bearing lumbar type vertebral bodies are present. The lowest fully formed vertebral body is L5. Alignment: No significant listhesis is present. Lumbar lordosis is normal. Vertebrae: Vertebral body heights are normal. Diffuse infiltrative disease is present throughout the marrow spaces. Innumerable enhancing lesions are present without expansion of the bone. No pathologic fractures are present. Conus medullaris and cauda equina: Conus extends to the L1 level. Conus and cauda equina appear normal. Paraspinal and other soft tissues: A 2.5 cm simple cyst is present at the upper pole of the left kidney. No other focal lesions are present. No significant adenopathy is present. Disc levels: T12-L1: Normal disc signal and height is present. No focal protrusion or stenosis is present. L1-2: Normal disc signal and height is present. No focal protrusion  or stenosis is present. L2-3: A bright were disc protrusion is present. Mild facet hypertrophy is present bilaterally. No significant stenosis is present. L3-4: A broad-based disc protrusion is asymmetric to the left. Mild facet hypertrophy is noted bilaterally. No focal stenosis is present. L4-5: Chronic loss of disc height present. A mild disc protrusion extends into the foramina bilaterally. No significant stenosis is present. L5-S1: Normal disc signal and height is present. No focal protrusion or stenosis is present. IMPRESSION: 1. Innumerable enhancing lesions throughout the marrow spaces  consistent with diffuse osseous metastatic disease. 2. No pathologic fractures. 3. Mild disc disease and facet hypertrophy as described without significant stenosis. 4. 2.5 cm simple cyst in the left kidney. No follow-up imaging is recommended. [employer] 2021; [id number], Bosniak Classification of Cystic Renal Masses, Version 2019. Electronically Signed   By: Marin Roberts M.D.   On: 06/16/2023 13:53   CT CHEST WO CONTRAST  Result Date: 06/11/2023 CLINICAL DATA:  Lytic bone lesion seen on prior CT abdomen and pelvis. Evaluate chest for primary malignancy. EXAM: CT CHEST WITHOUT CONTRAST TECHNIQUE: Multidetector CT imaging of the chest was performed following the standard protocol without IV contrast. RADIATION DOSE REDUCTION: This exam was performed according to the departmental dose-optimization program which includes automated exposure control, adjustment of the mA and/or kV according to patient size and/or use of iterative reconstruction technique. COMPARISON:  CT abdomen and pelvis today FINDINGS: Cardiovascular: Heart is normal size. Aorta is normal caliber. Moderate coronary artery and aortic calcifications. Mediastinum/Nodes: No mediastinal, hilar, or axillary adenopathy. Trachea and esophagus are unremarkable. Thyroid unremarkable. Lungs/Pleura: Moderate right pleural effusion and small left pleural effusion. Airspace disease centrally within the right upper lobe, right middle lobe and right lower lobe. This could reflect pneumonia or lymphangitic spread of tumor. No well-defined mass lesion. Airway thickening throughout the right lung. Upper Abdomen: See abdominal CT report earlier. Musculoskeletal: Mixed lytic and sclerotic lesions throughout the thoracic spine. Lytic lesions within the scapula bilaterally. Mottled appearance within multiple ribs bilaterally. Lytic lesion within the manubrium. IMPRESSION: Central airway thickening and airspace disease throughout the right lung. This could  reflect pneumonia or lymphangitic spread of tumor. Moderate right pleural effusion and small left pleural effusion. Extensive mixed lytic and sclerotic lesions throughout the visualized bony structures compatible with metastases. Coronary artery disease. Aortic Atherosclerosis (ICD10-I70.0). Electronically Signed   By: Charlett Nose M.D.   On: 06/11/2023 19:29   CT ABDOMEN PELVIS W CONTRAST  Result Date: 06/11/2023 CLINICAL DATA:  Abdominal pain and left flank pain for several days. Vomiting. * Tracking Code: BO * EXAM: CT ABDOMEN AND PELVIS WITH CONTRAST TECHNIQUE: Multidetector CT imaging of the abdomen and pelvis was performed using the standard protocol following bolus administration of intravenous contrast. RADIATION DOSE REDUCTION: This exam was performed according to the departmental dose-optimization program which includes automated exposure control, adjustment of the mA and/or kV according to patient size and/or use of iterative reconstruction technique. CONTRAST:  OMNIPAQUE IOHEXOL 300 MG/ML  SOLN COMPARISON:  11/19/2014 FINDINGS: Lower Chest: New moderate right and small left pleural effusions are seen. Atelectasis is seen in visualized portions of both lung bases. Hepatobiliary:  No suspicious hepatic masses identified. Pancreas: No mass or inflammatory changes. Gallbladder is unremarkable. No evidence of biliary ductal dilatation. Spleen: Within normal limits in size and appearance. Adrenals/Urinary Tract: No suspicious masses identified. No evidence of ureteral calculi or hydronephrosis. Stomach/Bowel: Small hiatal hernia noted. No evidence of obstruction, inflammatory process or abnormal fluid collections. Normal appendix visualized.  Sigmoid colon surgical anastomosis noted. Vascular/Lymphatic: No pathologically enlarged lymph nodes. No acute vascular findings. Reproductive: Prior hysterectomy noted. Adnexal regions are unremarkable in appearance. Other:  None. Musculoskeletal: Numerous lytic  bone lesions are seen involving several left lower ribs, spine, and pelvis, consistent with bone metastases or multiple myeloma. IMPRESSION: Diffuse lytic bone lesions, consistent with bone metastases or multiple myeloma. No other masses or lymphadenopathy within the abdomen or pelvis. New moderate right and small left pleural effusions, and bibasilar atelectasis. Small hiatal hernia. Electronically Signed   By: Danae Orleans M.D.   On: 06/11/2023 15:24    Labs:  CBC: Recent Labs    06/11/23 1229 06/15/23 1531 06/16/23 0517 06/17/23 0444  WBC 7.8 10.4 8.9 9.1  HGB 12.5 12.2 11.2* 12.4  HCT 39.3 38.5 35.1* 39.6  PLT 439* 378 359 389    COAGS: No results for input(s): "INR", "APTT" in the last 8760 hours.  BMP: Recent Labs    06/11/23 1229 06/15/23 1531 06/16/23 0517 06/17/23 0444  NA 140 140 141 142  K 3.4* 3.7 3.8 4.0  CL 101 102 104 103  CO2 25 23 25 26   GLUCOSE 104* 109* 112* 117*  BUN 12 9 8 8   CALCIUM 9.0 9.2 8.6* 8.9  CREATININE 0.96 0.86 0.85 0.79  GFRNONAA >60 >60 >60 >60    LIVER FUNCTION TESTS: Recent Labs    06/11/23 1813 06/17/23 0444  BILITOT 0.5 0.7  AST 35 38  ALT 18 34  ALKPHOS 167* 191*  PROT 8.0 7.8  ALBUMIN 3.2* 2.8*    TUMOR MARKERS: No results for input(s): "AFPTM", "CEA", "CA199", "CHROMGRNA" in the last 8760 hours.  Assessment and Plan: 67 y.o. 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  Also known to have new bilateral pleural effusions with possible lymphangitic spread of tumor into the lungs.  Request now received from oncology for CT-guided bone marrow biopsy to exclude myeloma.Risks and benefits of procedure was discussed with the patient  including, but not limited to bleeding, infection, damage  to adjacent structures or low yield requiring additional tests.  All of the questions were answered and there is agreement to proceed.  Consent signed and in chart.    Thank you for this interesting consult.  I greatly enjoyed meeting Karina White and look forward to participating in their care.  A copy of this report was sent to the requesting provider on this date.  Electronically Signed: D. Jeananne Rama, PA-C 06/17/2023, 9:10 AM   I spent a total of 20 minutes    in face to face in clinical consultation, greater than 50% of which was counseling/coordinating care for CT-guided bone marrow biopsy

## 2023-06-17 NOTE — Procedures (Signed)
Pre-procedure Diagnosis: Concern for multiple myeloma Post-procedure Diagnosis: Same  Technically successful CT guided bone marrow aspiration and biopsy of left iliac crest.   Complications: None Immediate  EBL: None  Signed: Simonne Come Pager: [phone] 06/17/2023, 10:43 AM

## 2023-06-17 NOTE — TOC Initial Note (Signed)
Transition of Care [facility]) - Initial/Assessment Note    Patient Details  Name: Karina White MRN: [id number] Date of Birth: Mar 04, 1956  [facility] CM/SW Contact:    Lanier Clam, RN Phone Number: 06/17/2023, 3:56 PM  Clinical Narrative: d/c plan home.                  Expected Discharge Plan: Home/Self Care Barriers to Discharge: Continued Medical Work up   Patient Goals and CMS Choice Patient states their goals for this hospitalization and ongoing recovery are:: Home CMS Medicare.gov Compare Post Acute Care list provided to:: Patient Choice offered to / list presented to : Patient [place] ownership interest in [facility].provided to:: Patient    Expected Discharge Plan and Services     Post Acute Care Choice: Resumption of Svcs/PTA Provider Living arrangements for the past 2 months: Single Family Home                                      Prior Living Arrangements/Services Living arrangements for the past 2 months: Single Family Home Lives with:: Adult Children Patient language and need for interpreter reviewed:: Yes Do you feel safe going back to the place where you live?: Yes      Need for Family Participation in Patient Care: Yes (Comment) Care giver support system in place?: Yes (comment)   Criminal Activity/Legal Involvement Pertinent to Current Situation/Hospitalization: No - Comment as needed  Activities of Daily Living   ADL Screening (condition at time of admission) Independently performs ADLs?: Yes (appropriate for developmental age) Is the patient deaf or have difficulty hearing?: No Does the patient have difficulty seeing, even when wearing glasses/contacts?: No Does the patient have difficulty concentrating, remembering, or making decisions?: No  Permission Sought/Granted Permission sought to share information with : Case Manager Permission granted to share information with : Yes, Verbal Permission  Granted  Share Information with NAME: Case manager           Emotional Assessment Appearance:: Appears stated age Attitude/Demeanor/Rapport: Gracious Affect (typically observed): Accepting Orientation: : Oriented to Self, Oriented to Place, Oriented to Situation, Oriented to  Time Alcohol / Substance Use: Not Applicable Psych Involvement: No (comment)  Admission diagnosis:  Intractable back pain [M54.9] Patient Active Problem List   Diagnosis Date Noted   Intractable back pain 06/15/2023   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) 06/15/2023   Colonic mass 11/19/2014   GI bleed 11/19/2014   Essential hypertension 11/19/2014   PCP:  Pcp, No Pharmacy:   [id number] - [place], [place] - [address] AT [facility] OF [address] [place] [address] Phone: [phone] Fax: [phone]     Social Determinants of Health (SDOH) Social History: SDOH Screenings   Food Insecurity: No Food Insecurity (06/15/2023)  Housing: Low Risk  (06/15/2023)  Transportation Needs: No Transportation Needs (06/15/2023)  Utilities: Not At Risk (06/15/2023)  Tobacco Use: Unknown (06/15/2023)   SDOH Interventions:     Readmission Risk Interventions     No data to display

## 2023-06-17 NOTE — Progress Notes (Signed)
So far, we are still awaiting some results.  She had the MRI yesterday.  She has extensive bony involvement.  She will have her thoracentesis today.  This will definitely be helpful.  Her 24-hour urine is pending.  Her labs show sodium 142.  Potassium 4.0.  BUN 8 creatinine 0.79.  Calcium 8.9 with an albumin of 2.8.  Her iron sat is incredibly low.  Her iron saturation is only 9%.  Her CBC shows white count of 9.1.  Hemoglobin 12.4.  Platelet count 389.  I suspect that she probably going to need a bone marrow biopsy.  I would think that we might be able to get some results this way.  I am not sure as to why she would be so iron deficient.  Again I am not sure when her last colonoscopy was.  We will check her stool for blood.  Her pain seems to be doing better.  She is able to rest last night.  She has had no problem with bowels or bladder.  There has been no leg weakness.  She has had no fever.  Again, we still have a lot of information to put together.  We really need to get biopsies.  Again I am not sure why her iron is so low.  Again we really need to check her stool.  I know that she will get great care from everybody upon [address].   Proverbs 19:21

## 2023-06-17 NOTE — Plan of Care (Signed)

## 2023-06-17 NOTE — Procedures (Signed)
Ultrasound-guided diagnostic and therapeutic right thoracentesis performed yielding 770 cc of  yellow fluid. No immediate complications. Follow-up chest x-ray pending. The fluid was sent to the lab for preordered studies. EBL none. Pt requested [place] to stop drainage after the above amount was collected.

## 2023-06-17 NOTE — Progress Notes (Signed)
PROGRESS NOTE    Karina White  DVV:[id number] DOB: Mar 01, 1956 DOA: 06/15/2023 PCP: Pcp, No   Brief Narrative:  HPI per Dr. Benita Gutter on 06/15/23 Karina White is a 67 y.o. female with medical history significant of HTN, GI bleed, sigmoid mass s/p left colectomy (2016) who presents as a transfer from outside ED with worsening pain back.      Pt presented to ED on 10/1 with concern for left flank pain 3 days prior. Pain starts in left middle thoracic and radiates around to anterior LUQ. CT chest/abd/pelvis revealing for numerous lytic lesions to bilateral ribs, thoracic spine, bilateral scapular and manubrium. Oncologist Dr. Al Pimple was consulted to arrange outpatient follow up. She was provided with narcotics for pain control and discharged.    She returns to ED today with uncontrolled pain. Says oxycodone was prescribed for every 8 hours but she was only getting relieve for 6 hours although it seems she was only taking it 2 times a day. Even with the medication her pain do not completely subside but is tolerable. She reports a month of dry cough but no shortness of breath or chest pain. Tat the onset of her left sided pain last week she had several episodes of vomiting. Had vomiting again 4 days ago that has resolved. No diarrhea or constipation. No changes in bowel habits. 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 Reports last colonoscopy in 2016 which is what Epic has on record. Also reports a normal mammogram sometime after the pandemic. She does self breast exams and has not noted any nodules, nipple discharge or skin discoloration. She reports weight loss but has been intentional with diet and daughter at bedside reports could be due to family stress. [name] passed away a few years ago. Pt has never used tobacco products or illicit drugs.  No family hx of malignancy. She still works part time as a Secondary school teacher Dr. [name] was  consulted by ED PA and review her previous lab work for workup for multiple myeloma and felt unlikely that it was her primary diagnosis.  Dr. [name] is currently recommending admission for expedited workup, palliative care consult for pain control and oncology will consult.   **Interim History Oncology evaluated and recommending obtaining further workup for her lytic bone lesions and having to do a 24-hour urine on her as well as obtaining MRIs of her spine and a bone biopsy possibly.  They also recommended obtaining a thoracentesis given her moderate right pleural effusion however when she went to go and get it done she refused.  Palliative care has been consulted to help with pain management and she is also been given Zometa  She is currently undergoing further workup and ended up getting a bone marrow biopsy and thoracentesis today.  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 There is also new bilateral pleural effusion with possible lymphangitic spread of tumor to the lungs. -There was initial concern for mulitple myeloma but no M spike seen  -Although she does not have any changes in bowel pattern or GI symptoms she does have hx of sigmoid mass s/p colectomy. Will obtain CEA.  -No notable nodules felt on breast exam and reports normal mammogram a few years ago although we do not have records  -No family hx  of malignancy -Oncology Dr. [name] consulted and appreciates further recommendations and he is recommending MRI imaging as well as a 24-hour urine on her as well as a thoracentesis and giving her Zometa -Pain control overnight with PRN Oxycodone-Acetaminophen q4hr and IV Morphine 1 mg every 3 as needed -T Spine MRI done and showed " Diffuse abnormal marrow signal and enhancement consistent with extensive malignancy.  This may represent metastatic disease or multiple myeloma. No pathologic fractures. Mild right foraminal narrowing at T9-10 and T10-11. Large bilateral pleural effusions, right greater than left." -L Spine MRI done and showed "Innumerable enhancing lesions throughout the marrow spaces consistent with diffuse osseous metastatic disease. No pathologic fractures. Mild disc disease and facet hypertrophy as described without significant stenosis. 2.5 cm simple cyst in the left kidney. No follow-up imaging is recommended." -Oncology is ordering a UPEP/U IFE/light chains/TP and a 24-hour urine and this is pending  -She is also given 4 mg of Zoledronic acid -Oncology is also recommending obtaining a possible bone marrow biopsy which was done today as well as palliative involvement for help with pain management  Fever -Unclear Etiology but post Bone Marrow Bx and Thoracentesis and could be related to Above -Spiked a Temp of 101.1 -If Re-occurs will Pan-Cx -DG Chest done and showed " No evidence of pneumothorax or other complication following right-sided thoracentesis. Decreased right pleural effusion."    Moderate Right-sided pleural effusion -Oncology consulted and recommending a thoracentesis and patient was agreeable initially but then refused yesterday but is agreeable today and underwent it this Afternoon and it was successful ultrasound guided diagnostic and therapeutic right thoracentesis yielding 770 cc of pleural fluid.   Essential Hypertension -Uncontrolled -Pt reports being non-compliant with Coreg and only takes it occasionally. -Carvedilol has been resumed at 10 mg p.o. daily however will also add amlodipine 10 mg p.o. daily and she also has labetalol 5 mg IV every 2.  For high blood pressure for systolic greater than 180  -Continue to Monitor blood pressures per protocol; Last BP reading was elevated 166/79   Microcytic Anemia likely of Chronic Disease -Hgb/Hct Trend: Recent Labs  Lab  06/11/23 1229 06/15/23 1531 06/16/23 0517 06/17/23 0444  HGB 12.5 12.2 11.2* 12.4  HCT 39.3 38.5 35.1* 39.6  -Checked Anemia Panel and it showed an iron level of 21, UIBC 218, TIBC of 239, saturation ratios of 9%, ferritin level 680, folate level 8.6, vitamin B12 level of 773 -Hold off Iron Infusion given temp of 101.1 -Check FOBT -Continue to Monitor for S/Sx of Bleeding; No overt bleeding noted -Repeat CBC in the AM  Hypoalbuminemia -Patient's Albumin Trend: Recent Labs  Lab 06/11/23 1813 06/17/23 0444  ALBUMIN 3.2* 2.8*  -Continue to Monitor and Trend and repeat CMP in the AM   DVT prophylaxis: enoxaparin (LOVENOX) injection 40 mg Start: 06/18/23 1000    Code Status: Full Code Family Communication: No family currently at bedside  Disposition Plan:  Level of care: Telemetry Status is: Inpatient Remains inpatient appropriate because: His further clinical workup and clearance by oncology   Consultants:  Medical Oncology Interventional Radiology Palliative Care Medicine  Procedures:  As delineated as above  Antimicrobials:  Anti-infectives (From admission, onward)    None       Subjective: Seen and examined at bedside after her procedures and she is doing okay.  She denied any chest pain or shortness of breath.  She did spike a temperature this afternoon but thinks is because she was wearing too many clothes.  No other concerns reported at this time.  Objective: Vitals:   06/17/23 1131 06/17/23 1142 06/17/23 1237 06/17/23 1424  BP: (!) 160/80 (!) 154/83 (!) 166/79   Pulse:      Resp:   14   Temp:   (!) 101.1 F (38.4 C) 100.1 F (37.8 C)  TempSrc:   Oral Oral  SpO2:   96%   Weight:      Height:        Intake/Output Summary (Last 24 hours) at 06/17/2023 1655 Last data filed at 06/17/2023 1545 Gross per 24 hour  Intake 240 ml  Output 100 ml  Net 140 ml   Filed Weights   06/15/23 1233 06/15/23 2001  Weight: 83 kg 79.5 kg   Examination: Physical  Exam:  Constitutional: WN/WD overweight chronically ill-appearing African-American female in no acute distress Respiratory: Diminished to auscultation bilaterally with some coarse breath sounds worse on the right compared to left and had some slight crackles but no appreciable rhonchi, rales, wheezing.  Normal respiratory effort and is not wearing any supplemental oxygen via nasal cannula Cardiovascular: RRR, no murmurs / rubs / gallops. S1 and S2 auscultated.  Trace extremity edema.  Abdomen: Soft, non-tender, slightly distended secondary to body habitus. Bowel sounds positive.  GU: Deferred. Musculoskeletal: No clubbing / cyanosis of digits/nails. No joint deformity upper and lower extremities.  Skin: No rashes, lesions, ulcers on a limited skin evaluation. No induration; Warm and dry.  Neurologic: CN 2-12 grossly intact with no focal deficits. Romberg sign and cerebellar reflexes not assessed.  Psychiatric: Normal judgment and insight. Alert and oriented x 3. Normal mood and appropriate affect.   Data Reviewed: I have personally reviewed following labs and imaging studies  CBC: Recent Labs  Lab 06/11/23 1229 06/15/23 1531 06/16/23 0517 06/17/23 0444  WBC 7.8 10.4 8.9 9.1  NEUTROABS  --   --   --  7.6  HGB 12.5 12.2 11.2* 12.4  HCT 39.3 38.5 35.1* 39.6  MCV 78.0* 77.2* 78.0* 78.9*  PLT 439* 378 359 389   Basic Metabolic Panel: Recent Labs  Lab 06/11/23 1229 06/15/23 1531 06/16/23 0517 06/17/23 0444  NA 140 140 141 142  K 3.4* 3.7 3.8 4.0  CL 101 102 104 103  CO2 25 23 25 26   GLUCOSE 104* 109* 112* 117*  BUN 12 9 8 8   CREATININE 0.96 0.86 0.85 0.79  CALCIUM 9.0 9.2 8.6* 8.9  MG  --   --   --  2.3  PHOS  --   --   --  2.8   GFR: Estimated Creatinine Clearance: 72.1 mL/min (by C-G formula based on SCr of 0.79 mg/dL). Liver Function Tests: Recent Labs  Lab 06/11/23 1813 06/17/23 0444  AST 35 38  ALT 18 34  ALKPHOS 167* 191*  BILITOT 0.5 0.7  PROT 8.0 7.8   ALBUMIN 3.2* 2.8*   Recent Labs  Lab 06/11/23 1229  LIPASE 28   No results for input(s): "AMMONIA" in the last 168 hours. Coagulation Profile: No results for input(s): "INR", "PROTIME" in the last 168 hours. Cardiac Enzymes: No results for input(s): "CKTOTAL", "CKMB", "CKMBINDEX", "TROPONINI" in the last 168 hours. BNP (last 3 results) No results for input(s): "PROBNP" in the last 8760 hours. HbA1C: No results for input(s): "HGBA1C" in the last 72 hours. CBG: No results for input(s): "GLUCAP" in the last 168 hours. Lipid Profile: No results for input(s): "CHOL", "HDL", "LDLCALC", "TRIG", "CHOLHDL", "LDLDIRECT" in the last 72 hours. Thyroid Function Tests:  No results for input(s): "TSH", "T4TOTAL", "FREET4", "T3FREE", "THYROIDAB" in the last 72 hours. Anemia Panel: Recent Labs    06/17/23 0444  VITAMINB12 773  FOLATE 8.6  FERRITIN 680*  TIBC 239*  IRON 21*  RETICCTPCT 1.0   Sepsis Labs: No results for input(s): "PROCALCITON", "LATICACIDVEN" in the last 168 hours.  No results found for this or any previous visit (from the past 240 hour(s)).   Radiology Studies: CT BONE MARROW BIOPSY & ASPIRATION  Result Date: 06/17/2023 INDICATION: Multiple lytic lesions. Please perform CT-guided bone marrow biopsy evaluate for multiple myeloma. EXAM: CT-GUIDED BONE MARROW BIOPSY AND ASPIRATION MEDICATIONS: None ANESTHESIA/SEDATION: Moderate (conscious) sedation was employed during this procedure as administered by the Interventional Radiology RN. A total of Versed 1 mg and Fentanyl 50 mcg was administered intravenously. Moderate Sedation Time: 10 minutes. The patient's level of consciousness and vital signs were monitored continuously by radiology nursing throughout the procedure under my direct supervision. COMPLICATIONS: None immediate. PROCEDURE: Informed consent was obtained from the patient following an explanation of the procedure, risks, benefits and alternatives. The patient  understands, agrees and consents for the procedure. All questions were addressed. A time out was performed prior to the initiation of the procedure. The patient was positioned left lateral decubitus and non-contrast localization CT was performed of the pelvis to demonstrate the iliac marrow spaces. The operative site was prepped and draped in the usual sterile fashion. Under sterile conditions and local anesthesia, a 22 gauge spinal needle was utilized for procedural planning. Next, an 11 gauge coaxial bone biopsy needle was advanced into the left iliac marrow space. Needle position was confirmed with CT imaging. Initially, a bone marrow aspiration was performed. Next, a bone marrow biopsy was obtained with the 11 gauge outer bone marrow device. The needle was removed and superficial hemostasis was obtained with manual compression. A dressing was applied. The patient tolerated the procedure well without immediate post procedural complication. IMPRESSION: Successful CT guided left iliac bone marrow aspiration and core biopsy. Electronically Signed   By: Simonne Come M.D.   On: 06/17/2023 13:50   US THORACENTESIS ASP PLEURAL SPACE W/IMG GUIDE  Result Date: 06/17/2023 INDICATION: Patient with history of numerous lytic bony lesions of uncertain etiology, right greater than left pleural effusions; request received for diagnostic and therapeutic right thoracentesis. EXAM: ULTRASOUND GUIDED DIAGNOSTIC AND THERAPEUTIC RIGHT THORACENTESIS MEDICATIONS: 8 mL 1% lidocaine COMPLICATIONS: None immediate. PROCEDURE: An ultrasound guided thoracentesis was thoroughly discussed with the patient and questions answered. The benefits, risks, alternatives and complications were also discussed. The patient understands and wishes to proceed with the procedure. Written consent was obtained. Ultrasound was performed to localize and mark an adequate pocket of fluid in the right chest. The area was then prepped and draped in the normal  sterile fashion. 1% Lidocaine was used for local anesthesia. Under ultrasound guidance a 6 Fr Safe-T-Centesis catheter was introduced. Thoracentesis was performed. The catheter was removed and a dressing applied. FINDINGS: A total of approximately 770 cc of yellow fluid was removed. Samples were sent to the laboratory as requested by the clinical team. IMPRESSION: Successful ultrasound guided diagnostic and therapeutic right thoracentesis yielding 770 cc of pleural fluid. Performed by: Artemio Aly Electronically Signed   By: Simonne Come M.D.   On: 06/17/2023 13:37   DG Chest Port 1 View  Result Date: 06/17/2023 CLINICAL DATA:  Status post right-sided thoracentesis EXAM: PORTABLE CHEST 1 VIEW COMPARISON:  Prior chest x-ray 06/17/2023 at 7:18 a.m. FINDINGS: No evidence of  right-sided pneumothorax. Decreased volume of right pleural effusion with improved aeration of the right lung base. Persistent diffuse patchy airspace infiltrates throughout the right central lung. Smaller left layering pleural effusion is similar. Stable cardiac and mediastinal contours. IMPRESSION: 1. No evidence of pneumothorax or other complication following right-sided thoracentesis. 2. Decreased right pleural effusion. Electronically Signed   By: Malachy Moan M.D.   On: 06/17/2023 13:34   DG CHEST PORT 1 VIEW  Result Date: 06/17/2023 CLINICAL DATA:  Shortness of breath.  Confusion. EXAM: PORTABLE CHEST 1 VIEW COMPARISON:  11/19/2014 FINDINGS: Atherosclerotic calcification of the aortic arch. Heart size within normal limits although increased from 2016. Large right pleural effusion. Bilateral perihilar and basilar airspace opacities with indistinct pulmonary vasculature favoring acute pulmonary edema over bilateral pneumonia. Small left pleural effusion. Patient has numerous osseous metastatic lesions better shown on cross-sectional imaging exams, compatible with metastatic disease or myeloma. IMPRESSION: 1. Large right  pleural effusion and small left pleural effusion. 2. Bilateral perihilar and basilar airspace opacities with indistinct pulmonary vasculature favoring acute pulmonary edema over bilateral pneumonia. 3. Numerous osseous metastatic lesions. Electronically Signed   By: Gaylyn Rong M.D.   On: 06/17/2023 11:11   MR THORACIC SPINE W WO CONTRAST  Result Date: 06/16/2023 CLINICAL DATA:  Abnormal CT.  Diffuse spinal metastases. EXAM: MRI THORACIC WITHOUT AND WITH CONTRAST TECHNIQUE: Multiplanar and multiecho pulse sequences of the thoracic spine were obtained without and with intravenous contrast. CONTRAST:  7mL GADAVIST GADOBUTROL 1 MMOL/ML IV SOLN COMPARISON:  CT chest without contrast 06/11/2023. FINDINGS: Alignment:  No significant listhesis is present. Vertebrae: Diffuse abnormal marrow signal and enhancement is consistent with extensive malignancy vertebral body heights are maintained. No pathologic fractures are present. No extraosseous enhancement or expansion of the bone is present. Cord:  Normal signal and morphology. Paraspinal and other soft tissues: Large bilateral pleural effusions are present, right greater than left. Paraspinous soft tissues are otherwise unremarkable. Disc levels: A left paramedian disc protrusion is present at T6-7 without significant stenosis. Broad-based disc protrusions at T8-9, T9-10 and T10-11 partially effaces the ventral CSF without significant stenosis at these levels. Facet hypertrophy contributes to mild right foraminal narrowing at T9-10 and T10-11. The foramina are otherwise patent bilaterally. IMPRESSION: 1. Diffuse abnormal marrow signal and enhancement consistent with extensive malignancy. This may represent metastatic disease or multiple myeloma. 2. No pathologic fractures. 3. Mild right foraminal narrowing at T9-10 and T10-11. 4. Large bilateral pleural effusions, right greater than left. Electronically Signed   By: Marin Roberts M.D.   On: 06/16/2023  14:04   MR Lumbar Spine W Wo Contrast  Result Date: 06/16/2023 CLINICAL DATA:  Musculoskeletal neoplasm.  Abnormal CT scan. EXAM: MRI LUMBAR SPINE WITHOUT AND WITH CONTRAST TECHNIQUE: Multiplanar and multiecho pulse sequences of the lumbar spine were obtained without and with intravenous contrast. CONTRAST:  7mL GADAVIST GADOBUTROL 1 MMOL/ML IV SOLN COMPARISON:  CT of the abdomen pelvis with contrast 06/11/2023. FINDINGS: Segmentation: A partially sacralized transitional L5 segment is noted. Four other non rib-bearing lumbar type vertebral bodies are present. The lowest fully formed vertebral body is L5. Alignment: No significant listhesis is present. Lumbar lordosis is normal. Vertebrae: Vertebral body heights are normal. Diffuse infiltrative disease is present throughout the marrow spaces. Innumerable enhancing lesions are present without expansion of the bone. No pathologic fractures are present. Conus medullaris and cauda equina: Conus extends to the L1 level. Conus and cauda equina appear normal. Paraspinal and other soft tissues: A 2.5 cm simple cyst  is present at the upper pole of the left kidney. No other focal lesions are present. No significant adenopathy is present. Disc levels: T12-L1: Normal disc signal and height is present. No focal protrusion or stenosis is present. L1-2: Normal disc signal and height is present. No focal protrusion or stenosis is present. L2-3: A bright were disc protrusion is present. Mild facet hypertrophy is present bilaterally. No significant stenosis is present. L3-4: A broad-based disc protrusion is asymmetric to the left. Mild facet hypertrophy is noted bilaterally. No focal stenosis is present. L4-5: Chronic loss of disc height present. A mild disc protrusion extends into the foramina bilaterally. No significant stenosis is present. L5-S1: Normal disc signal and height is present. No focal protrusion or stenosis is present. IMPRESSION: 1. Innumerable enhancing lesions  throughout the marrow spaces consistent with diffuse osseous metastatic disease. 2. No pathologic fractures. 3. Mild disc disease and facet hypertrophy as described without significant stenosis. 4. 2.5 cm simple cyst in the left kidney. No follow-up imaging is recommended. [employer] 2021; [id number], Bosniak Classification of Cystic Renal Masses, Version 2019. Electronically Signed   By: Marin Roberts M.D.   On: 06/16/2023 13:53    Scheduled Meds:  amLODipine  10 mg Oral Daily   aspirin EC  81 mg Oral Daily   carvedilol  10 mg Oral Daily   [START ON 06/18/2023] enoxaparin (LOVENOX) injection  40 mg Subcutaneous Q24H   Continuous Infusions:  ferric gluconate (FERRLECIT) IVPB Stopped (06/17/23 1243)    LOS: 2 days   Marguerita Merles, DO [facility] Available via Epic secure chat 7am-7pm After these hours, please refer to coverage provider listed on [email or web address] 06/17/2023, 4:55 PM

## 2023-06-17 NOTE — Consult Note (Incomplete)
Consultation Note Date: 06/17/2023   Patient Name: Karina White  DOB: 04-Feb-1956  MRN: [id number]  Age / Sex: 67 y.o., female  PCP: Pcp, No Referring Physician: Merlene Laughter, DO  Reason for Consultation: Pain control  HPI/Patient Profile: 67 y.o. female  with past medical history of HTN, GI bleed, sigmoid mass s/p left colectomy 2016 admitted on 06/15/2023 with worsening back pain with recently discovered bone mets with unknown primary.   Clinical Assessment and Goals of Care: ***  Primary Decision Maker {Primary Decision GMWNU:27253}    SUMMARY OF RECOMMENDATIONS   ***  Code Status/Advance Care Planning: {Palliative Code status:23503}   Symptom Management:  ***  Palliative Prophylaxis:  {Palliative Prophylaxis:21015}  Additional Recommendations (Limitations, Scope, Preferences): {Recommended Scope and Preferences:21019}  Psycho-social/Spiritual:  Desire for further Chaplaincy support:{YES NO:22349} Additional Recommendations: {PAL SOCIAL:21064}  Prognosis:  {Palliative Care Prognosis:23504}  Discharge Planning: {Palliative dispostion:23505}      Primary Diagnoses: Present on Admission:  Intractable back pain  Essential hypertension   I have reviewed the medical record, interviewed the patient and family, and examined the patient. The following aspects are pertinent.  Past Medical History:  Diagnosis Date   Hypertension    Social History   Socioeconomic History   Marital status: Widowed    Spouse name: Not on file   Number of children: Not on file   Years of education: Not on file   Highest education level: Not on file  Occupational History   Not on file  Tobacco Use   Smoking status: Never   Smokeless tobacco: Not on file  Substance and Sexual Activity   Alcohol use: No   Drug use: No   Sexual activity: Not on file  Other Topics Concern   Not on  file  Social History Narrative   Not on file   Social Determinants of Health   Financial Resource Strain: Not on file  Food Insecurity: No Food Insecurity (06/15/2023)   Hunger Vital Sign    Worried About Running Out of Food in the Last Year: Never true    Ran Out of Food in the Last Year: Never true  Transportation Needs: No Transportation Needs (06/15/2023)   PRAPARE - Administrator, Civil Service (Medical): No    Lack of Transportation (Non-Medical): No  Physical Activity: Not on file  Stress: Not on file  Social Connections: Not on file   History reviewed. No pertinent family history. Scheduled Meds:  amLODipine  10 mg Oral Daily   aspirin EC  81 mg Oral Daily   carvedilol  10 mg Oral Daily   enoxaparin (LOVENOX) injection  40 mg Subcutaneous Q24H   Continuous Infusions:  ferric gluconate (FERRLECIT) IVPB     PRN Meds:.labetalol, morphine injection, ondansetron (ZOFRAN) IV, oxyCODONE-acetaminophen Allergies  Allergen Reactions   Penicillins Hives and Itching   Codeine Nausea And Vomiting   Review of Systems  Physical Exam  Vital Signs: BP (!) 189/101   Pulse 93   Temp 97.7 F (36.5 C) (Oral)  Resp 18   Ht 5\' 7"  (1.702 m)   Wt 79.5 kg   SpO2 94%   BMI 27.45 kg/m  Pain Scale: 0-10   Pain Score: 0-No pain   SpO2: SpO2: 94 % O2 Device:SpO2: 94 % O2 Flow Rate: .   IO: Intake/output summary:  Intake/Output Summary (Last 24 hours) at 06/17/2023 0909 Last data filed at 06/16/2023 2200 Gross per 24 hour  Intake 820 ml  Output 400 ml  Net 420 ml    LBM: Last BM Date : 06/14/23 Baseline Weight: Weight: 83 kg Most recent weight: Weight: 79.5 kg     Palliative Assessment/Data:     Time In: *** Time Out: *** Time Total: *** Greater than 50%  of this time was spent counseling and coordinating care related to the above assessment and plan.  Signed by: Yong Channel, NP Palliative Medicine Team Pager # [phone] (M-F 8a-5p) Team Phone  # [phone] (Nights/Weekends)

## 2023-06-18 ENCOUNTER — Inpatient Hospital Stay (HOSPITAL_COMMUNITY): Payer: Medicare Other

## 2023-06-18 DIAGNOSIS — R970 Elevated carcinoembryonic antigen [CEA]: Secondary | ICD-10-CM | POA: Diagnosis not present

## 2023-06-18 DIAGNOSIS — G893 Neoplasm related pain (acute) (chronic): Secondary | ICD-10-CM | POA: Diagnosis not present

## 2023-06-18 DIAGNOSIS — I10 Essential (primary) hypertension: Secondary | ICD-10-CM | POA: Diagnosis not present

## 2023-06-18 DIAGNOSIS — M549 Dorsalgia, unspecified: Secondary | ICD-10-CM | POA: Diagnosis not present

## 2023-06-18 LAB — COMPREHENSIVE METABOLIC PANEL
ALT: 31 U/L (ref 0–44)
AST: 41 U/L (ref 15–41)
Albumin: 2.4 g/dL — ABNORMAL LOW (ref 3.5–5.0)
Alkaline Phosphatase: 195 U/L — ABNORMAL HIGH (ref 38–126)
Anion gap: 14 (ref 5–15)
BUN: 10 mg/dL (ref 8–23)
CO2: 24 mmol/L (ref 22–32)
Calcium: 7.9 mg/dL — ABNORMAL LOW (ref 8.9–10.3)
Chloride: 104 mmol/L (ref 98–111)
Creatinine, Ser: 0.89 mg/dL (ref 0.44–1.00)
GFR, Estimated: >60 mL/min (ref >60)
Glucose, Bld: 94 mg/dL (ref 70–99)
Potassium: 3.7 mmol/L (ref 3.5–5.1)
Sodium: 142 mmol/L (ref 135–145)
Total Bilirubin: 0.9 mg/dL (ref 0.3–1.2)
Total Protein: 6.6 g/dL (ref 6.5–8.1)

## 2023-06-18 LAB — CBC WITH DIFFERENTIAL/PLATELET
Abs Immature Granulocytes: 0.04 10*3/uL (ref 0.00–0.07)
Basophils Absolute: 0 10*3/uL (ref 0.0–0.1)
Basophils Relative: 0 %
Eosinophils Absolute: 0 10*3/uL (ref 0.0–0.5)
Eosinophils Relative: 1 %
HCT: 35.2 % — ABNORMAL LOW (ref 36.0–46.0)
Hemoglobin: 11.1 g/dL — ABNORMAL LOW (ref 12.0–15.0)
Immature Granulocytes: 1 %
Lymphocytes Relative: 11 %
Lymphs Abs: 0.9 10*3/uL (ref 0.7–4.0)
MCH: 24.8 pg — ABNORMAL LOW (ref 26.0–34.0)
MCHC: 31.5 g/dL (ref 30.0–36.0)
MCV: 78.7 fL — ABNORMAL LOW (ref 80.0–100.0)
Monocytes Absolute: 0.5 10*3/uL (ref 0.1–1.0)
Monocytes Relative: 7 %
Neutro Abs: 6.2 10*3/uL (ref 1.7–7.7)
Neutrophils Relative %: 80 %
Platelets: 339 10*3/uL (ref 150–400)
RBC: 4.47 MIL/uL (ref 3.87–5.11)
RDW: 15.8 % — ABNORMAL HIGH (ref 11.5–15.5)
WBC: 7.7 10*3/uL (ref 4.0–10.5)
nRBC: 0 % (ref 0.0–0.2)

## 2023-06-18 LAB — MAGNESIUM: Magnesium: 2.4 mg/dL (ref 1.7–2.4)

## 2023-06-18 LAB — ERYTHROPOIETIN: Erythropoietin: 40.8 m[IU]/mL — ABNORMAL HIGH (ref 2.6–18.5)

## 2023-06-18 LAB — CANCER ANTIGEN 27.29: CA 27.29: 130.4 U/mL — ABNORMAL HIGH (ref 0.0–38.6)

## 2023-06-18 LAB — PHOSPHORUS: Phosphorus: 2.5 mg/dL (ref 2.5–4.6)

## 2023-06-18 MED ORDER — OXYCODONE-ACETAMINOPHEN 5-325 MG PO TABS: 1.0000 | ORAL_TABLET | Freq: Four times a day (QID) | ORAL | 0 refills | Status: AC | PRN

## 2023-06-18 MED ORDER — AMLODIPINE BESYLATE 10 MG PO TABS: 10.0000 mg | ORAL_TABLET | Freq: Every day | ORAL | 0 refills | Status: DC

## 2023-06-18 MED ORDER — ACETAMINOPHEN 325 MG PO TABS: 650.0000 mg | ORAL_TABLET | Freq: Four times a day (QID) | ORAL | 0 refills | Status: DC | PRN

## 2023-06-18 MED ORDER — CHOLECALCIFEROL 25 MCG (1000 UT) PO CAPS: 2000.0000 [IU] | ORAL_CAPSULE | Freq: Every day | ORAL | 0 refills | Status: DC

## 2023-06-18 MED ORDER — ONDANSETRON HCL 4 MG PO TABS: 4.0000 mg | ORAL_TABLET | Freq: Three times a day (TID) | ORAL | 0 refills | Status: AC | PRN

## 2023-06-18 NOTE — Progress Notes (Signed)
Palliative:  Chart reviewed. Discussed with Dr. Marland Mcalpine who reports improved pain control with minimal oxycodone. Recommendations for outpatient palliative Dr. Marland Mcalpine as plans for discharge today. Will send referral for Willette Alma at [facility] for palliative follow up and symptom management follow up.   No charge  Yong Channel, NP Palliative Medicine Team Pager [phone] (Please see [email or web address] for schedule) Team Phone [phone]

## 2023-06-18 NOTE — TOC Transition Note (Signed)
Transition of Care [facility]) - CM/SW Discharge Note   Patient Details  Name: Karina White MRN: [id number] Date of Birth: 03/08/1956  [facility] CM/SW Contact:  Lanier Clam, RN Phone Number: 06/18/2023, 3:27 PM   Clinical Narrative: d/c no CM needs.      Final next level of care: Home/Self Care Barriers to Discharge: No Barriers Identified   Patient Goals and CMS Choice CMS Medicare.gov Compare Post Acute Care list provided to:: Patient Choice offered to / list presented to : Patient  Discharge Placement                         Discharge Plan and Services Additional resources added to the After Visit Summary for       Post Acute Care Choice: Resumption of Svcs/PTA Provider                               Social Determinants of Health (SDOH) Interventions SDOH Screenings   Food Insecurity: No Food Insecurity (06/15/2023)  Housing: Low Risk  (06/15/2023)  Transportation Needs: No Transportation Needs (06/15/2023)  Utilities: Not At Risk (06/15/2023)  Tobacco Use: Unknown (06/15/2023)     Readmission Risk Interventions     No data to display

## 2023-06-18 NOTE — Plan of Care (Signed)
  Problem: Education: Goal: Knowledge of General Education information will improve Description: Including pain rating scale, medication(s)/side effects and non-pharmacologic comfort measures Outcome: Progressing   Problem: Coping: Goal: Level of anxiety will decrease Outcome: Progressing   Problem: Pain Managment: Goal: General experience of comfort will improve Outcome: Progressing   

## 2023-06-18 NOTE — Progress Notes (Signed)
Karina White had a very busy [birth year] yesterday.  She had both a thoracentesis today bone marrow biopsy done.  The thoracentesis pulled out yellow fluid.  It was not bloody.  Will have to see what the cytology shows.  She had a bone marrow biopsy done.  Of note, her CA 27.29, which is a marker for breast cancer, is elevated at 130.  Her CEA level is 14.7.  She really, really needs to have a mammogram.  Again not sure when her last mammogram was done.  She would like to go home.  She says she feels better.  She is not having pain issues.  Hopefully, if she does go home, she will be able to manage the pain at home.  Probably several more days before we get the results back from the bone marrow biopsy.  I would think cytology on the pleural fluid will come back in a couple days.  Her labs today show white cell count 7.7.  Hemoglobin 11.1.  Platelet count 339,000.  Her sodium 142.  Potassium 3.7.  BUN 10 creatinine 0.89.  Calcium 7.9 with an albumin of 2.4.  Her iron studies close show that she has low iron.  Ferritin 680 but iron saturation is only 9%.  We have given her IV iron to date.  She really needs to have her stool checked.  Of note, I think she has had a past history of colon cancer.  Her vital signs are temperature 99.5.  Pulse 94.  Blood pressure 135/84.  Her head neck exam shows no ocular or oral lesions.  She has no adenopathy in the neck.  Lungs are clear bilaterally.  Cardiac exam regular rate and rhythm.  Abdomen is soft.  Bowel sounds are present.  There is no fluid wave.  Extremity shows no clubbing, cyanosis or edema.  Neurological exam is nonfocal.   Again, she would like to go home.  I would think that it would be okay if she did go home.  Again she needs pain medication when she does go home.  I just worried that this might be metastatic breast cancer.  The elevated CA 27.29 is little bit concerning.  I am not sure when she had her last mammogram.  She does need to have  a PET scan done.  Unfortunately, this cannot be done in the hospital.  We will have to wait for this to be done as an outpatient.  I will have to see what the bone marrow result shows.  I will have to see what the cytology is on the pleural effusion.  I know that she has gotten incredible care from everybody up [facility] [address].  Christin Bach, MD  [place] [phone]

## 2023-06-18 NOTE — Discharge Summary (Signed)
Physician Discharge Summary   Patient: Karina White MRN: [id number] DOB: [birth year]  Admit date:     06/15/2023  Discharge date: 06/18/23  Discharge Physician: Marguerita Merles, DO   PCP: Pcp, No   Recommendations at discharge:   Follow-up with PCP within 1 to 2 weeks repeat CBC, CMP, mag, Phos within 1 week Follow-up with medical oncology within 1 to 2 weeks Follow-up with palliative care medicine outpatient setting Follow-up on the bone marrow biopsy as well as the thoracentesis labs Have outpatient PET scan and mammogram Follow-up with FOBT in outpatient setting  Discharge Diagnoses: Principal Problem:   Intractable back pain Active Problems:   Essential hypertension   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 [facility] Course: HPI per Dr. Benita Gutter on 06/15/23 Karina White is a 67 y.o. female with medical history significant of HTN, GI bleed, sigmoid mass s/p left colectomy (2016) who presents as a transfer from outside ED with worsening pain back.      Pt presented to ED on 10/1 with concern for left flank pain 3 days prior. Pain starts in left middle thoracic and radiates around to anterior LUQ. CT chest/abd/pelvis revealing for numerous lytic lesions to bilateral ribs, thoracic spine, bilateral scapular and manubrium. Oncologist Dr. Al Pimple was consulted to arrange outpatient follow up. She was provided with narcotics for pain control and discharged.    She returns to ED today with uncontrolled pain. Says oxycodone was prescribed for every 8 hours but she was only getting relieve for 6 hours although it seems she was only taking it 2 times a day. Even with the medication her pain do not completely subside but is tolerable. She reports a month of dry cough but no shortness of breath or chest pain. Tat the onset of her left sided pain last week she had several episodes of vomiting. Had vomiting again 4 days ago that has resolved.  No diarrhea or constipation. No changes in bowel habits. 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 Reports last colonoscopy in 2016 which is what [facility] has on record. Also reports a normal mammogram sometime after the pandemic. She does self breast exams and has not noted any nodules, nipple discharge or skin discoloration. She reports weight loss but has been intentional with diet and daughter at bedside reports could be due to family stress. [name] passed away a few years ago. Pt has never used tobacco products or illicit drugs.  No family hx of malignancy. She still works part time as a Secondary school teacher Dr. [name] was consulted by ED PA and review her previous lab work for workup for multiple myeloma and felt unlikely that it was her primary diagnosis.  Dr. [name] is currently recommending admission for expedited workup, palliative care consult for pain control and oncology will consult.   **Interim History Oncology evaluated and recommending obtaining further workup for her lytic bone lesions and having to do a 24-hour urine on her as well as obtaining MRIs of her spine and a bone biopsy possibly.  They also recommended obtaining a thoracentesis given her moderate right pleural effusion however when she went to go and get it done she refused.  Palliative care has been consulted to help with pain management and she is also been given Zometa  She is currently undergoing further workup and ended up getting a bone marrow biopsy and thoracentesis today.  Assessment and Plan:  Pain from Bone Metastases [facility]) with unclear primary but concerning for breast cancer -Pt presented with left flank pain earlier this week and found to have numerous lytic lesions to bilateral ribs, thoracic spine, bilateral scapular and manubrium. There is also new bilateral pleural effusion with possible lymphangitic spread of tumor to the lungs. -There was initial concern  for mulitple myeloma but no M spike seen  -Although she does not have any changes in bowel pattern or GI symptoms she does have hx of sigmoid mass s/p colectomy. Will obtain CEA and was 14.7 and CA 27.29 was 130.4 -No notable nodules felt on breast exam and reports normal mammogram a few years ago although we do not have records  -No family hx of malignancy -Oncology Dr. [name] consulted and appreciates further recommendations and he is recommending MRI imaging as well as a 24-hour urine on her as well as a thoracentesis and giving her Zometa -Pain control overnight with PRN Oxycodone-Acetaminophen q4hr and IV Morphine 1 mg every 3 as needed; pain is fairly well-controlled now and will need outpatient follow-up with palliative care medicine for further pain control -T Spine MRI done and showed " Diffuse abnormal marrow signal and enhancement consistent with extensive malignancy. This may represent metastatic disease or multiple myeloma. No pathologic fractures. Mild right foraminal narrowing at T9-10 and T10-11. Large bilateral pleural effusions, right greater than left." -L Spine MRI done and showed "Innumerable enhancing lesions throughout the marrow spaces consistent with diffuse osseous metastatic disease. No pathologic fractures. Mild disc disease and facet hypertrophy as described without significant stenosis. 2.5 cm simple cyst in the left kidney. No follow-up imaging is recommended." -Oncology is ordering a UPEP/U IFE/light chains/TP and a 24-hour urine and this is pending  -She is also given 4 mg of Zoledronic acid -Bone marrow biopsy was done and will need to be followed -Oncology feels that she is stable for discharge and will follow-up in outpatient setting with outpatient PET scan, mammogram and follow-up on her bone marrow biopsy and thoracentesis cytology results  Fever, improved -Unclear Etiology but post Bone Marrow Bx and Thoracentesis and could be related to Above -Spiked a Temp of  101.1 yesterday and is improved and resolved -If Re-occurs will Pan-Cx -DG Chest done and showed " No evidence of pneumothorax or other complication following right-sided thoracentesis. Decreased right pleural effusion."   Moderate Right-sided pleural effusion -Oncology consulted and recommending a thoracentesis and patient was agreeable initially but then refused yesterday but is agreeable today and underwent it this Afternoon and it was successful ultrasound guided diagnostic and therapeutic right thoracentesis yielding 770 cc of pleural fluid. -Follow-up on the cytology results   Essential Hypertension -Uncontrolled -Pt reports being non-compliant with Coreg and only takes it occasionally. -Carvedilol has been resumed at 10 mg p.o. daily however will also add amlodipine 10 mg p.o. daily and she also has labetalol 5 mg IV every 2.  For high blood pressure for systolic greater than 180  -Continue to Monitor blood pressures per protocol; Last BP reading was stable at 118/71  Microcytic Anemia likely of Chronic Disease -Hgb/Hct Trend: Recent Labs  Lab 06/11/23 1229 06/15/23 1531 06/16/23 0517 06/17/23 0444 06/18/23 0502  HGB 12.5 12.2 11.2* 12.4 11.1*  HCT 39.3 38.5 35.1* 39.6 35.2*  -Checked Anemia Panel and it showed an iron level of 21, UIBC 218, TIBC of 239, saturation ratios of 9%, ferritin level 680, folate level 8.6, vitamin B12 level of 773 -Hold off Iron  Infusion given temp of 101.1 yesterday but was given today -Check FOBT but never obtained so can be obtained in outpatient setting -Continue to Monitor for S/Sx of Bleeding; No overt bleeding noted -Repeat CBC in the AM  Hypoalbuminemia -Patient's Albumin Trend: Recent Labs  Lab 06/11/23 1813 06/17/23 0444 06/18/23 0502  ALBUMIN 3.2* 2.8* 2.4*  -Continue to Monitor and Trend and repeat CMP in the AM  Consultants: Medical oncology, interventional radiology Procedures performed: As delineated as above Disposition:  Home Diet recommendation:  Discharge Diet Orders (From admission, onward)     Start     Ordered   06/18/23 0000  Diet - low sodium heart healthy        06/18/23 1215           Cardiac diet DISCHARGE MEDICATION: Allergies as of 06/18/2023       Reactions   Penicillins Hives, Itching   Codeine Nausea And Vomiting        Medication List     STOP taking these medications    hydrochlorothiazide 25 MG tablet Commonly known as: HYDRODIURIL   Pfizer COVID-19 Vac Bivalent injection Generic drug: COVID-19 mRNA bivalent vaccine Proofreader)       TAKE these medications    acetaminophen 325 MG tablet Commonly known as: TYLENOL Take 2 tablets (650 mg total) by mouth every 6 (six) hours as needed for mild pain, fever or headache.   amLODipine 10 MG tablet Commonly known as: NORVASC Take 1 tablet (10 mg total) by mouth daily. Start taking on: June 19, 2023   aspirin EC 81 MG tablet Take 81 mg by mouth daily.   carvedilol 10 MG 24 hr capsule Commonly known as: COREG CR Take 1 capsule (10 mg total) by mouth daily.   CENTRUM ADULTS PO Take 1 tablet by mouth.   Cholecalciferol 25 MCG (1000 UT) capsule Take 2 capsules (2,000 Units total) by mouth daily. What changed: how much to take   Garlic 1000 MG Caps Take 1,000 mg by mouth daily.   Omega 3 1200 MG Caps Take 1,200 mg by mouth daily.   ondansetron 4 MG tablet Commonly known as: ZOFRAN Take 1 tablet (4 mg total) by mouth every 8 (eight) hours as needed for up to 5 days for nausea or vomiting.   oxyCODONE-acetaminophen 5-325 MG tablet Commonly known as: PERCOCET/ROXICET Take 1 tablet by mouth every 6 (six) hours as needed for up to 3 days for moderate pain. What changed: when to take this   psyllium 0.52 g capsule Commonly known as: REGULOID Take 2.08 g by mouth daily.       Discharge Exam: Filed Weights   06/15/23 1233 06/15/23 2001  Weight: 83 kg 79.5 kg   Vitals:   06/18/23 0908 06/18/23 1213   BP: (!) 144/75 118/71  Pulse: 79 73  Resp:  16  Temp: 98.6 F (37 C) 98.3 F (36.8 C)  SpO2: 96% 96%   Examination: Physical Exam:  Constitutional: WN/WD overweight African-American female in no acute distress Respiratory: Diminished to auscultation bilaterally, no wheezing, rales, rhonchi or crackles. Normal respiratory effort and patient is not tachypenic. No accessory muscle use.  Unlabored breathing Cardiovascular: RRR, no murmurs / rubs / gallops. S1 and S2 auscultated. No extremity edema.  Abdomen: Soft, non-tender, distended secondary to body habitus. Bowel sounds positive.  GU: Deferred. Musculoskeletal: No clubbing / cyanosis of digits/nails. No joint deformity upper and lower extremities.  Skin: No rashes, lesions, ulcers on limited skin evaluation. No induration;  Warm and dry.  Neurologic: CN 2-12 grossly intact with no focal deficits. Romberg sign and cerebellar reflexes not assessed.  Psychiatric: Normal judgment and insight. Alert and oriented x 3. Normal mood and appropriate affect.   Condition at discharge: stable  The results of significant diagnostics from this hospitalization (including imaging, microbiology, ancillary and laboratory) are listed below for reference.   Imaging Studies: DG CHEST PORT 1 VIEW  Result Date: 06/18/2023 CLINICAL DATA:  Pleural effusion. EXAM: PORTABLE CHEST 1 VIEW COMPARISON:  06/17/2023 FINDINGS: Increased densities along the lower lateral aspect of the right chest. Findings compatible with re-accumulation of right pleural fluid. Patchy parenchymal densities in the right lung have slightly improved. There continues to be more focal densities in the right infrahilar region. Persistent blunting at the left costophrenic angle may represent left pleural fluid. Negative for a pneumothorax. Heart size is within normal limits and stable. IMPRESSION: 1. Increased right pleural fluid. 2. Slightly decreased parenchymal disease in the right lung. 3.  Probable left pleural effusion. Electronically Signed   By: Richarda Overlie M.D.   On: 06/18/2023 08:58   CT BONE MARROW BIOPSY & ASPIRATION  Result Date: 06/17/2023 INDICATION: Multiple lytic lesions. Please perform CT-guided bone marrow biopsy evaluate for multiple myeloma. EXAM: CT-GUIDED BONE MARROW BIOPSY AND ASPIRATION MEDICATIONS: None ANESTHESIA/SEDATION: Moderate (conscious) sedation was employed during this procedure as administered by the Interventional Radiology RN. A total of Versed 1 mg and Fentanyl 50 mcg was administered intravenously. Moderate Sedation Time: 10 minutes. The patient's level of consciousness and vital signs were monitored continuously by radiology nursing throughout the procedure under my direct supervision. COMPLICATIONS: None immediate. PROCEDURE: Informed consent was obtained from the patient following an explanation of the procedure, risks, benefits and alternatives. The patient understands, agrees and consents for the procedure. All questions were addressed. A time out was performed prior to the initiation of the procedure. The patient was positioned left lateral decubitus and non-contrast localization CT was performed of the pelvis to demonstrate the iliac marrow spaces. The operative site was prepped and draped in the usual sterile fashion. Under sterile conditions and local anesthesia, a 22 gauge spinal needle was utilized for procedural planning. Next, an 11 gauge coaxial bone biopsy needle was advanced into the left iliac marrow space. Needle position was confirmed with CT imaging. Initially, a bone marrow aspiration was performed. Next, a bone marrow biopsy was obtained with the 11 gauge outer bone marrow device. The needle was removed and superficial hemostasis was obtained with manual compression. A dressing was applied. The patient tolerated the procedure well without immediate post procedural complication. IMPRESSION: Successful CT guided left iliac bone marrow  aspiration and core biopsy. Electronically Signed   By: Simonne Come M.D.   On: 06/17/2023 13:50   US THORACENTESIS ASP PLEURAL SPACE W/IMG GUIDE  Result Date: 06/17/2023 INDICATION: Patient with history of numerous lytic bony lesions of uncertain etiology, right greater than left pleural effusions; request received for diagnostic and therapeutic right thoracentesis. EXAM: ULTRASOUND GUIDED DIAGNOSTIC AND THERAPEUTIC RIGHT THORACENTESIS MEDICATIONS: 8 mL 1% lidocaine COMPLICATIONS: None immediate. PROCEDURE: An ultrasound guided thoracentesis was thoroughly discussed with the patient and questions answered. The benefits, risks, alternatives and complications were also discussed. The patient understands and wishes to proceed with the procedure. Written consent was obtained. Ultrasound was performed to localize and mark an adequate pocket of fluid in the right chest. The area was then prepped and draped in the normal sterile fashion. 1% Lidocaine was used for local  anesthesia. Under ultrasound guidance a 6 Fr Safe-T-Centesis catheter was introduced. Thoracentesis was performed. The catheter was removed and a dressing applied. FINDINGS: A total of approximately 770 cc of yellow fluid was removed. Samples were sent to the laboratory as requested by the clinical team. IMPRESSION: Successful ultrasound guided diagnostic and therapeutic right thoracentesis yielding 770 cc of pleural fluid. Performed by: Artemio Aly Electronically Signed   By: Simonne Come M.D.   On: 06/17/2023 13:37   DG Chest Port 1 View  Result Date: 06/17/2023 CLINICAL DATA:  Status post right-sided thoracentesis EXAM: PORTABLE CHEST 1 VIEW COMPARISON:  Prior chest x-ray 06/17/2023 at 7:18 a.m. FINDINGS: No evidence of right-sided pneumothorax. Decreased volume of right pleural effusion with improved aeration of the right lung base. Persistent diffuse patchy airspace infiltrates throughout the right central lung. Smaller left layering  pleural effusion is similar. Stable cardiac and mediastinal contours. IMPRESSION: 1. No evidence of pneumothorax or other complication following right-sided thoracentesis. 2. Decreased right pleural effusion. Electronically Signed   By: Malachy Moan M.D.   On: 06/17/2023 13:34   DG CHEST PORT 1 VIEW  Result Date: 06/17/2023 CLINICAL DATA:  Shortness of breath.  Confusion. EXAM: PORTABLE CHEST 1 VIEW COMPARISON:  11/19/2014 FINDINGS: Atherosclerotic calcification of the aortic arch. Heart size within normal limits although increased from 2016. Large right pleural effusion. Bilateral perihilar and basilar airspace opacities with indistinct pulmonary vasculature favoring acute pulmonary edema over bilateral pneumonia. Small left pleural effusion. Patient has numerous osseous metastatic lesions better shown on cross-sectional imaging exams, compatible with metastatic disease or myeloma. IMPRESSION: 1. Large right pleural effusion and small left pleural effusion. 2. Bilateral perihilar and basilar airspace opacities with indistinct pulmonary vasculature favoring acute pulmonary edema over bilateral pneumonia. 3. Numerous osseous metastatic lesions. Electronically Signed   By: Gaylyn Rong M.D.   On: 06/17/2023 11:11   MR THORACIC SPINE W WO CONTRAST  Result Date: 06/16/2023 CLINICAL DATA:  Abnormal CT.  Diffuse spinal metastases. EXAM: MRI THORACIC WITHOUT AND WITH CONTRAST TECHNIQUE: Multiplanar and multiecho pulse sequences of the thoracic spine were obtained without and with intravenous contrast. CONTRAST:  7mL GADAVIST GADOBUTROL 1 MMOL/ML IV SOLN COMPARISON:  CT chest without contrast 06/11/2023. FINDINGS: Alignment:  No significant listhesis is present. Vertebrae: Diffuse abnormal marrow signal and enhancement is consistent with extensive malignancy vertebral body heights are maintained. No pathologic fractures are present. No extraosseous enhancement or expansion of the bone is present. Cord:   Normal signal and morphology. Paraspinal and other soft tissues: Large bilateral pleural effusions are present, right greater than left. Paraspinous soft tissues are otherwise unremarkable. Disc levels: A left paramedian disc protrusion is present at T6-7 without significant stenosis. Broad-based disc protrusions at T8-9, T9-10 and T10-11 partially effaces the ventral CSF without significant stenosis at these levels. Facet hypertrophy contributes to mild right foraminal narrowing at T9-10 and T10-11. The foramina are otherwise patent bilaterally. IMPRESSION: 1. Diffuse abnormal marrow signal and enhancement consistent with extensive malignancy. This may represent metastatic disease or multiple myeloma. 2. No pathologic fractures. 3. Mild right foraminal narrowing at T9-10 and T10-11. 4. Large bilateral pleural effusions, right greater than left. Electronically Signed   By: Marin Roberts M.D.   On: 06/16/2023 14:04   MR Lumbar Spine W Wo Contrast  Result Date: 06/16/2023 CLINICAL DATA:  Musculoskeletal neoplasm.  Abnormal CT scan. EXAM: MRI LUMBAR SPINE WITHOUT AND WITH CONTRAST TECHNIQUE: Multiplanar and multiecho pulse sequences of the lumbar spine were obtained without and with intravenous  contrast. CONTRAST:  7mL GADAVIST GADOBUTROL 1 MMOL/ML IV SOLN COMPARISON:  CT of the abdomen pelvis with contrast 06/11/2023. FINDINGS: Segmentation: A partially sacralized transitional L5 segment is noted. Four other non rib-bearing lumbar type vertebral bodies are present. The lowest fully formed vertebral body is L5. Alignment: No significant listhesis is present. Lumbar lordosis is normal. Vertebrae: Vertebral body heights are normal. Diffuse infiltrative disease is present throughout the marrow spaces. Innumerable enhancing lesions are present without expansion of the bone. No pathologic fractures are present. Conus medullaris and cauda equina: Conus extends to the L1 level. Conus and cauda equina appear normal.  Paraspinal and other soft tissues: A 2.5 cm simple cyst is present at the upper pole of the left kidney. No other focal lesions are present. No significant adenopathy is present. Disc levels: T12-L1: Normal disc signal and height is present. No focal protrusion or stenosis is present. L1-2: Normal disc signal and height is present. No focal protrusion or stenosis is present. L2-3: A bright were disc protrusion is present. Mild facet hypertrophy is present bilaterally. No significant stenosis is present. L3-4: A broad-based disc protrusion is asymmetric to the left. Mild facet hypertrophy is noted bilaterally. No focal stenosis is present. L4-5: Chronic loss of disc height present. A mild disc protrusion extends into the foramina bilaterally. No significant stenosis is present. L5-S1: Normal disc signal and height is present. No focal protrusion or stenosis is present. IMPRESSION: 1. Innumerable enhancing lesions throughout the marrow spaces consistent with diffuse osseous metastatic disease. 2. No pathologic fractures. 3. Mild disc disease and facet hypertrophy as described without significant stenosis. 4. 2.5 cm simple cyst in the left kidney. No follow-up imaging is recommended. [employer] 2021; [id number], Bosniak Classification of Cystic Renal Masses, Version 2019. Electronically Signed   By: Marin Roberts M.D.   On: 06/16/2023 13:53   CT CHEST WO CONTRAST  Result Date: 06/11/2023 CLINICAL DATA:  Lytic bone lesion seen on prior CT abdomen and pelvis. Evaluate chest for primary malignancy. EXAM: CT CHEST WITHOUT CONTRAST TECHNIQUE: Multidetector CT imaging of the chest was performed following the standard protocol without IV contrast. RADIATION DOSE REDUCTION: This exam was performed according to the departmental dose-optimization program which includes automated exposure control, adjustment of the mA and/or kV according to patient size and/or use of iterative reconstruction technique. COMPARISON:  CT  abdomen and pelvis today FINDINGS: Cardiovascular: Heart is normal size. Aorta is normal caliber. Moderate coronary artery and aortic calcifications. Mediastinum/Nodes: No mediastinal, hilar, or axillary adenopathy. Trachea and esophagus are unremarkable. Thyroid unremarkable. Lungs/Pleura: Moderate right pleural effusion and small left pleural effusion. Airspace disease centrally within the right upper lobe, right middle lobe and right lower lobe. This could reflect pneumonia or lymphangitic spread of tumor. No well-defined mass lesion. Airway thickening throughout the right lung. Upper Abdomen: See abdominal CT report earlier. Musculoskeletal: Mixed lytic and sclerotic lesions throughout the thoracic spine. Lytic lesions within the scapula bilaterally. Mottled appearance within multiple ribs bilaterally. Lytic lesion within the manubrium. IMPRESSION: Central airway thickening and airspace disease throughout the right lung. This could reflect pneumonia or lymphangitic spread of tumor. Moderate right pleural effusion and small left pleural effusion. Extensive mixed lytic and sclerotic lesions throughout the visualized bony structures compatible with metastases. Coronary artery disease. Aortic Atherosclerosis (ICD10-I70.0). Electronically Signed   By: Charlett Nose M.D.   On: 06/11/2023 19:29   CT ABDOMEN PELVIS W CONTRAST  Result Date: 06/11/2023 CLINICAL DATA:  Abdominal pain and left flank pain for several days.  Vomiting. * Tracking Code: BO * EXAM: CT ABDOMEN AND PELVIS WITH CONTRAST TECHNIQUE: Multidetector CT imaging of the abdomen and pelvis was performed using the standard protocol following bolus administration of intravenous contrast. RADIATION DOSE REDUCTION: This exam was performed according to the departmental dose-optimization program which includes automated exposure control, adjustment of the mA and/or kV according to patient size and/or use of iterative reconstruction technique. CONTRAST:   OMNIPAQUE IOHEXOL 300 MG/ML  SOLN COMPARISON:  11/19/2014 FINDINGS: Lower Chest: New moderate right and small left pleural effusions are seen. Atelectasis is seen in visualized portions of both lung bases. Hepatobiliary:  No suspicious hepatic masses identified. Pancreas: No mass or inflammatory changes. Gallbladder is unremarkable. No evidence of biliary ductal dilatation. Spleen: Within normal limits in size and appearance. Adrenals/Urinary Tract: No suspicious masses identified. No evidence of ureteral calculi or hydronephrosis. Stomach/Bowel: Small hiatal hernia noted. No evidence of obstruction, inflammatory process or abnormal fluid collections. Normal appendix visualized. Sigmoid colon surgical anastomosis noted. Vascular/Lymphatic: No pathologically enlarged lymph nodes. No acute vascular findings. Reproductive: Prior hysterectomy noted. Adnexal regions are unremarkable in appearance. Other:  None. Musculoskeletal: Numerous lytic bone lesions are seen involving several left lower ribs, spine, and pelvis, consistent with bone metastases or multiple myeloma. IMPRESSION: Diffuse lytic bone lesions, consistent with bone metastases or multiple myeloma. No other masses or lymphadenopathy within the abdomen or pelvis. New moderate right and small left pleural effusions, and bibasilar atelectasis. Small hiatal hernia. Electronically Signed   By: Danae Orleans M.D.   On: 06/11/2023 15:24    Microbiology: Results for orders placed or performed during the hospital encounter of 11/19/14  Surgical pcr screen     Status: None   Collection Time: 11/22/14  9:17 PM   Specimen: Nasal Mucosa; Nasal Swab  Result Value Ref Range Status   MRSA, PCR NEGATIVE NEGATIVE Final   Staphylococcus aureus NEGATIVE NEGATIVE Final    Comment:        The Xpert SA Assay (FDA approved for NASAL specimens in patients over 78 years of age), is one component of a comprehensive surveillance program.  Test performance has been  validated by [facility] for patients greater than or equal to 24 year old. It is not intended to diagnose infection nor to guide or monitor treatment.    Labs: CBC: Recent Labs  Lab 06/15/23 1531 06/16/23 0517 06/17/23 0444 06/18/23 0502  WBC 10.4 8.9 9.1 7.7  NEUTROABS  --   --  7.6 6.2  HGB 12.2 11.2* 12.4 11.1*  HCT 38.5 35.1* 39.6 35.2*  MCV 77.2* 78.0* 78.9* 78.7*  PLT 378 359 389 339   Basic Metabolic Panel: Recent Labs  Lab 06/15/23 1531 06/16/23 0517 06/17/23 0444 06/18/23 0502  NA 140 141 142 142  K 3.7 3.8 4.0 3.7  CL 102 104 103 104  CO2 23 25 26 24   GLUCOSE 109* 112* 117* 94  BUN 9 8 8 10   CREATININE 0.86 0.85 0.79 0.89  CALCIUM 9.2 8.6* 8.9 7.9*  MG  --   --  2.3 2.4  PHOS  --   --  2.8 2.5   Liver Function Tests: Recent Labs  Lab 06/17/23 0444 06/18/23 0502  AST 38 41  ALT 34 31  ALKPHOS 191* 195*  BILITOT 0.7 0.9  PROT 7.8 6.6  ALBUMIN 2.8* 2.4*   CBG: No results for input(s): "GLUCAP" in the last 168 hours.  Discharge time spent: greater than 30 minutes.  Signed: Marguerita Merles, DO [facility] 06/18/2023

## 2023-06-19 ENCOUNTER — Other Ambulatory Visit: Payer: Self-pay | Admitting: Hematology & Oncology

## 2023-06-19 DIAGNOSIS — C50919 Malignant neoplasm of unspecified site of unspecified female breast: Secondary | ICD-10-CM

## 2023-06-20 LAB — UPEP/UIFE/LIGHT CHAINS/TP, 24-HR UR
% BETA, Urine: 33.4 %
ALPHA 1 URINE: 4.4 %
Albumin, U: 25.8 %
Alpha 2, Urine: 18.3 %
Free Kappa Lt Chains,Ur: 151.41 mg/L — ABNORMAL HIGH (ref 1.17–86.46)
Free Kappa/Lambda Ratio: 6.89 (ref 1.83–14.26)
Free Lambda Lt Chains,Ur: 21.97 mg/L — ABNORMAL HIGH (ref 0.27–15.21)
GAMMA GLOBULIN URINE: 18.2 %
Total Protein, Urine-Ur/day: 194 mg/(24.h) — ABNORMAL HIGH (ref 30–150)
Total Protein, Urine: 32.3 mg/dL

## 2023-06-24 LAB — CYTOLOGY - NON PAP

## 2023-06-25 ENCOUNTER — Encounter: Payer: Self-pay | Admitting: Hematology & Oncology

## 2023-06-25 ENCOUNTER — Inpatient Hospital Stay: Payer: Medicare Other | Attending: Hematology & Oncology

## 2023-06-25 ENCOUNTER — Encounter (HOSPITAL_COMMUNITY): Payer: Self-pay

## 2023-06-25 ENCOUNTER — Inpatient Hospital Stay (HOSPITAL_BASED_OUTPATIENT_CLINIC_OR_DEPARTMENT_OTHER): Payer: Medicare Other | Admitting: Hematology & Oncology

## 2023-06-25 ENCOUNTER — Encounter: Payer: Self-pay | Admitting: *Deleted

## 2023-06-25 ENCOUNTER — Other Ambulatory Visit: Payer: Self-pay

## 2023-06-25 VITALS — BP 139/90 | HR 107 | Temp 98.4°F | Resp 18 | Ht 67.0 in | Wt 172.0 lb

## 2023-06-25 DIAGNOSIS — C7951 Secondary malignant neoplasm of bone: Secondary | ICD-10-CM | POA: Insufficient documentation

## 2023-06-25 DIAGNOSIS — C50919 Malignant neoplasm of unspecified site of unspecified female breast: Secondary | ICD-10-CM

## 2023-06-25 DIAGNOSIS — Z170 Estrogen receptor positive status [ER+]: Secondary | ICD-10-CM | POA: Diagnosis not present

## 2023-06-25 DIAGNOSIS — J910 Malignant pleural effusion: Secondary | ICD-10-CM | POA: Insufficient documentation

## 2023-06-25 DIAGNOSIS — C7952 Secondary malignant neoplasm of bone marrow: Secondary | ICD-10-CM | POA: Insufficient documentation

## 2023-06-25 DIAGNOSIS — G893 Neoplasm related pain (acute) (chronic): Secondary | ICD-10-CM | POA: Diagnosis not present

## 2023-06-25 LAB — RETICULOCYTES
Immature Retic Fract: 25 % — ABNORMAL HIGH (ref 2.3–15.9)
RBC.: 4.92 MIL/uL (ref 3.87–5.11)
Retic Count, Absolute: 71.8 10*3/uL (ref 19.0–186.0)
Retic Ct Pct: 1.5 % (ref 0.4–3.1)

## 2023-06-25 LAB — CBC WITH DIFFERENTIAL (CANCER CENTER ONLY)
Abs Immature Granulocytes: 0.12 10*3/uL — ABNORMAL HIGH (ref 0.00–0.07)
Basophils Absolute: 0.1 10*3/uL (ref 0.0–0.1)
Basophils Relative: 1 %
Eosinophils Absolute: 0.2 10*3/uL (ref 0.0–0.5)
Eosinophils Relative: 2 %
HCT: 37.7 % (ref 36.0–46.0)
Hemoglobin: 12.2 g/dL (ref 12.0–15.0)
Immature Granulocytes: 2 %
Lymphocytes Relative: 22 %
Lymphs Abs: 1.8 10*3/uL (ref 0.7–4.0)
MCH: 24.8 pg — ABNORMAL LOW (ref 26.0–34.0)
MCHC: 32.4 g/dL (ref 30.0–36.0)
MCV: 76.6 fL — ABNORMAL LOW (ref 80.0–100.0)
Monocytes Absolute: 0.4 10*3/uL (ref 0.1–1.0)
Monocytes Relative: 5 %
Neutro Abs: 5.7 10*3/uL (ref 1.7–7.7)
Neutrophils Relative %: 68 %
Platelet Count: 463 10*3/uL — ABNORMAL HIGH (ref 150–400)
RBC: 4.92 MIL/uL (ref 3.87–5.11)
RDW: 16.2 % — ABNORMAL HIGH (ref 11.5–15.5)
WBC Count: 8.3 10*3/uL (ref 4.0–10.5)
nRBC: 0.2 % (ref 0.0–0.2)

## 2023-06-25 LAB — FERRITIN: Ferritin: 1018 ng/mL — ABNORMAL HIGH (ref 11–307)

## 2023-06-25 LAB — CMP (CANCER CENTER ONLY)
ALT: 19 U/L (ref 0–44)
AST: 23 U/L (ref 15–41)
Albumin: 3.5 g/dL (ref 3.5–5.0)
Alkaline Phosphatase: 218 U/L — ABNORMAL HIGH (ref 38–126)
Anion gap: 12 (ref 5–15)
BUN: 7 mg/dL — ABNORMAL LOW (ref 8–23)
CO2: 27 mmol/L (ref 22–32)
Calcium: 8.8 mg/dL — ABNORMAL LOW (ref 8.9–10.3)
Chloride: 102 mmol/L (ref 98–111)
Creatinine: 0.92 mg/dL (ref 0.44–1.00)
GFR, Estimated: >60 mL/min (ref >60)
Glucose, Bld: 147 mg/dL — ABNORMAL HIGH (ref 70–99)
Potassium: 3.2 mmol/L — ABNORMAL LOW (ref 3.5–5.1)
Sodium: 141 mmol/L (ref 135–145)
Total Bilirubin: 0.4 mg/dL (ref 0.3–1.2)
Total Protein: 7.5 g/dL (ref 6.5–8.1)

## 2023-06-25 LAB — SAMPLE TO BLOOD BANK

## 2023-06-25 LAB — IRON AND IRON BINDING CAPACITY (CC-WL,HP ONLY)
Iron: 50 ug/dL (ref 28–170)
Saturation Ratios: 22 % (ref 10.4–31.8)
TIBC: 232 ug/dL — ABNORMAL LOW (ref 250–450)
UIBC: 182 ug/dL (ref 148–442)

## 2023-06-25 LAB — LACTATE DEHYDROGENASE: LDH: 317 U/L — ABNORMAL HIGH (ref 98–192)

## 2023-06-25 NOTE — Progress Notes (Signed)
Per Dr [name], request for Foundation One testing sent on specimen [id number] DOS 06/17/2023.  Patient here for new patient appointment, after recent hospitalization where she was diagnosed with metastatic breast cancer. Will follow up once Dr [name] places orders and dictates note.  Oncology Nurse Navigator Documentation     06/25/2023   11:00 AM  Oncology Nurse Navigator Flowsheets  Abnormal Finding Date 06/11/2023  Confirmed Diagnosis Date 06/17/2023  Diagnosis Status Pending Molecular Studies  Navigator Follow Up Date: 06/28/2023  Navigator Follow Up Reason: Appointment Review  Navigator Location [facility]  Navigator Encounter Type Initial MedOnc;Molecular Studies  Patient Visit Type MedOnc  Treatment Phase Pre-Tx/Tx Discussion  Barriers/Navigation Needs Coordination of Care;Education  Interventions Coordination of Care  Acuity Level 2-Minimal Needs (1-2 Barriers Identified)  Coordination of Care Pathology  Time Spent with Patient 30

## 2023-06-26 ENCOUNTER — Telehealth: Payer: Self-pay

## 2023-06-26 LAB — CANCER ANTIGEN 27.29: CA 27.29: 167.1 U/mL — ABNORMAL HIGH (ref 0.0–38.6)

## 2023-06-26 NOTE — Telephone Encounter (Signed)
Clinical Social Work was referred by the Nurse Navigator per new patient protocol.  Attempted to contact patient.  Left a vm with contact information and a request for a call back.

## 2023-06-27 ENCOUNTER — Encounter: Payer: Self-pay | Admitting: Hematology & Oncology

## 2023-06-27 DIAGNOSIS — C50919 Malignant neoplasm of unspecified site of unspecified female breast: Secondary | ICD-10-CM | POA: Insufficient documentation

## 2023-06-27 HISTORY — DX: Malignant neoplasm of unspecified site of unspecified female breast: C50.919

## 2023-06-27 NOTE — Progress Notes (Signed)
Hematology and Oncology Follow Up Visit  Timitra Waldmann [id number] Jul 05, 1956 67 y.o. 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 Wittenmyer is back for her first office visit.  [facility]  She came in because of severe bony pain.  She had extensive spinal metastasis by CT/MRI.  She also had a pleural effusion.  We went and did a thoracentesis.  This was on the right side.  This was done on 06/17/2023.  The pathology report ([id number]) showed malignant cells that were consistent with adenocarcinoma of the breast.  I think the cells were ER positive.  She had been anemic.  We went and did a bone marrow biopsy on her.  This was done also on 06/17/2023.  The pathology report ([id number]) showed hypercellular marrow with metastatic carcinoma.  The cells were positive for estrogen receptor.  Unfortunately, really no other testing could be done.  She is feeling better.  She comes in with her daughter.  Her CA 27.29 has been elevated.  Today, the level is 167.  She has not had a mammogram for quite a while.  I suspect that she is going to have an abnormal mammogram.  Will try to get this set up within a week.  On her physical exam, it looks like there may be something with the left breast.  We really need to get prognostic markers.  I will have to see if we can maybe get Foundation One studies.  She did get iron while she was in the hospital.  She was iron deficient.  I think we also need to get a PET scan on her.  Thankfully, the MRI of the spine that she had done did not show any obvious pathologic fractures.  She has had no problems going to the bathroom.  She has had no bleeding.  Her appetite is down a little bit.  I appreciate the fact that she is certainly aware and concerned over the diagnosis.  Currently, I would  say her performance status is probably ECOG 1.    Medications:  Current Outpatient Medications:    acetaminophen (TYLENOL) 325 MG tablet, Take 2 tablets (650 mg total) by mouth every 6 (six) hours as needed for mild pain, fever or headache., Disp: 20 tablet, Rfl: 0   amLODipine (NORVASC) 10 MG tablet, Take 1 tablet (10 mg total) by mouth daily., Disp: 30 tablet, Rfl: 0   aspirin EC 81 MG tablet, Take 81 mg by mouth daily., Disp: , Rfl:    carvedilol (COREG CR) 10 MG 24 hr capsule, Take 1 capsule (10 mg total) by mouth daily., Disp: 30 capsule, Rfl: 0   Cholecalciferol 25 MCG (1000 UT) capsule, Take 2 capsules (2,000 Units total) by mouth daily., Disp: 60 capsule, Rfl: 0   Garlic 1000 MG CAPS, Take 1,000 mg by mouth daily., Disp: , Rfl:    Multiple Vitamins-Minerals (CENTRUM ADULTS PO), Take 1 tablet by mouth., Disp: , Rfl:    Omega 3 1200 MG CAPS, Take 1,200 mg by mouth daily., Disp: , Rfl:    psyllium (REGULOID) 0.52 G capsule, Take 2.08 g by mouth daily., Disp: , Rfl:   Allergies:  Allergies  Allergen Reactions   Penicillins Hives and Itching   Codeine Nausea And Vomiting    Past Medical History, Surgical history, Social history, and Family History were reviewed  and updated.  Review of Systems: Review of Systems  Constitutional:  Positive for appetite change and unexpected weight change.  HENT:  Negative.    Eyes: Negative.   Respiratory: Negative.    Cardiovascular:  Positive for chest pain.  Gastrointestinal: Negative.   Endocrine: Negative.   Genitourinary: Negative.    Musculoskeletal:  Positive for back pain.  Skin: Negative.   Neurological: Negative.   Hematological: Negative.   Psychiatric/Behavioral: Negative.      Physical Exam:  height is 5\' 7"  (1.702 m) and weight is 172 lb (78 kg). Her oral temperature is 98.4 F (36.9 C). Her blood pressure is 139/90 (abnormal) and her pulse is 107 (abnormal). Her respiration is 18 and oxygen saturation is 99%.   Wt  Readings from Last 3 Encounters:  06/25/23 172 lb (78 kg)  06/15/23 175 lb 4.3 oz (79.5 kg)  06/11/23 180 lb (81.6 kg)    Physical Exam Vitals reviewed.  Constitutional:      Comments: Her breast exam shows right breast with no masses, edema or erythema.  There is no right axillary adenopathy.  Left breast does show some irregularity and some firmness at about the 12 o'clock position.  There is no tenderness.  There is no nipple discharge.  There is no left axillary adenopathy.  HENT:     Head: Normocephalic and atraumatic.  Eyes:     Pupils: Pupils are equal, round, and reactive to light.  Cardiovascular:     Rate and Rhythm: Normal rate and regular rhythm.     Heart sounds: Normal heart sounds.  Pulmonary:     Effort: Pulmonary effort is normal.     Breath sounds: Normal breath sounds.  Abdominal:     General: Bowel sounds are normal.     Palpations: Abdomen is soft.     Comments: Abdominal exam is soft.  She has decent bowel sounds.  There is no fluid wave.  There is no palpable liver or spleen tip.  Musculoskeletal:        General: No tenderness or deformity. Normal range of motion.     Cervical back: Normal range of motion.     Comments: Back exam shows some slight tenderness to palpation over the thoracic and lumbar spine.  Lymphadenopathy:     Cervical: No cervical adenopathy.  Skin:    General: Skin is warm and dry.     Findings: No erythema or rash.  Neurological:     Mental Status: She is alert and oriented to person, place, and time.  Psychiatric:        Behavior: Behavior normal.        Thought Content: Thought content normal.        Judgment: Judgment normal.      Lab Results  Component Value Date   WBC 8.3 06/25/2023   HGB 12.2 06/25/2023   HCT 37.7 06/25/2023   MCV 76.6 (L) 06/25/2023   PLT 463 (H) 06/25/2023     Chemistry      Component Value Date/Time   NA 141 06/25/2023 1057   K 3.2 (L) 06/25/2023 1057   CL 102 06/25/2023 1057   CO2 27  06/25/2023 1057   BUN 7 (L) 06/25/2023 1057   CREATININE 0.92 06/25/2023 1057      Component Value Date/Time   CALCIUM 8.8 (L) 06/25/2023 1057   ALKPHOS 218 (H) 06/25/2023 1057   AST 23 06/25/2023 1057   ALT 19 06/25/2023 1057   BILITOT 0.4 06/25/2023 1057  Impression and Plan: Ms. Arranaga is a very nice 67 year old Afro-American female.  She has stage IV adenocarcinoma of the breast.  Again I suspect that this is going to come from the left breast just by her exam.  Again it is can be critical that we get the prognostic markers.  Again, looks like from the bone marrow, the tumors are ER positive.  I would like to think that if we find something on mammogram, that we would be able to get a biopsy of the actual tumor.  I may go ahead and get her on some antiestrogen therapy right now.  Again, we can at least get her on something that we can use.  The HER2 status is can be critical.  If she is HER2 positive, then we will definitely have to use chemotherapy with anti-HER2 treatment.  We will have to see what the PET scan shows.  Hopefully this will be done in a week.  I did set up the mammogram.  Hopefully this will be done in a week or so.  I will hold off on doing any MRIs of the brain for right now.  Hopefully, we will be able to get a long-lasting response with therapy.  Hopefully we might be able to use antiestrogen therapy on her.  I will plan to get her back depend on what we find with our PET scan and with the mammogram.  She also will probably need Zometa or Xgeva to help with the bones.     Josph Macho, MD 10/17/20245:46 PM

## 2023-06-28 LAB — SURGICAL PATHOLOGY

## 2023-06-29 ENCOUNTER — Other Ambulatory Visit: Payer: Self-pay | Admitting: Hematology & Oncology

## 2023-06-29 MED ORDER — LETROZOLE 2.5 MG PO TABS: 2.5000 mg | ORAL_TABLET | Freq: Every day | ORAL | 6 refills | Status: DC

## 2023-07-01 ENCOUNTER — Encounter: Payer: Self-pay | Admitting: *Deleted

## 2023-07-01 ENCOUNTER — Other Ambulatory Visit: Payer: Self-pay | Admitting: Hematology & Oncology

## 2023-07-01 DIAGNOSIS — C7951 Secondary malignant neoplasm of bone: Secondary | ICD-10-CM

## 2023-07-01 DIAGNOSIS — N6320 Unspecified lump in the left breast, unspecified quadrant: Secondary | ICD-10-CM

## 2023-07-01 DIAGNOSIS — C50919 Malignant neoplasm of unspecified site of unspecified female breast: Secondary | ICD-10-CM

## 2023-07-01 MED ORDER — DRONABINOL 5 MG PO CAPS: 5.0000 mg | ORAL_CAPSULE | Freq: Two times a day (BID) | ORAL | 0 refills | Status: DC

## 2023-07-01 NOTE — Progress Notes (Deleted)
Palliative Medicine [facility]  [phone] [phone]   Name: Karina White Date: 07/01/2023 MRN: [id number]  DOB: 03-16-1956  Patient Care Team: Pcp, No as PCP - General Josph Macho, MD as Medical Oncologist (Oncology) Gwendel Hanson, RN as Oncology Nurse Navigator    REASON FOR CONSULTATION: Karina White is a 67 y.o. female with oncologic medical history including recently diagnosed breast cancer (06/2023) with metastatic disease to the bone and pleura. Palliative ask to see for symptom management and goals of care.    SOCIAL HISTORY:     reports that she has never smoked. She does not have any smokeless tobacco history on file. She reports that she does not drink alcohol and does not use drugs.  ADVANCE DIRECTIVES:  None on file  CODE STATUS: Full code  PAST MEDICAL HISTORY: Past Medical History:  Diagnosis Date   Breast cancer metastasized to bone, unspecified laterality (HCC) 06/27/2023   Hypertension     PAST SURGICAL HISTORY:  Past Surgical History:  Procedure Laterality Date   ABDOMINAL HYSTERECTOMY  1980's   CESAREAN SECTION     PARTIAL COLECTOMY N/A 11/23/2014   Procedure: LAPAROSCOPIC CONVERTED TO OPEN PARTIAL COLECTOMY;  Surgeon: Abigail Miyamoto, MD;  Location: [facility] OR;  Service: General;  Laterality: N/A;    HEMATOLOGY/ONCOLOGY HISTORY:  Oncology History  Breast cancer metastasized to bone, unspecified laterality (HCC)  06/27/2023 Initial Diagnosis   Breast cancer metastasized to bone, unspecified laterality (HCC)   06/27/2023 Cancer Staging   Staging form: Breast, AJCC 8th Edition - Clinical: Stage IV (cTX, cNX, pM1, GX, ER+, PR: Unknown, HER2: Unknown) - Signed by Josph Macho, MD on 06/27/2023 Stage prefix: Initial diagnosis Nuclear grade: GX Histologic grading system: 3 grade system     ALLERGIES:  is allergic to penicillins and codeine.  MEDICATIONS:  Current Outpatient  Medications  Medication Sig Dispense Refill   acetaminophen (TYLENOL) 325 MG tablet Take 2 tablets (650 mg total) by mouth every 6 (six) hours as needed for mild pain, fever or headache. 20 tablet 0   amLODipine (NORVASC) 10 MG tablet Take 1 tablet (10 mg total) by mouth daily. 30 tablet 0   aspirin EC 81 MG tablet Take 81 mg by mouth daily.     carvedilol (COREG CR) 10 MG 24 hr capsule Take 1 capsule (10 mg total) by mouth daily. 30 capsule 0   Cholecalciferol 25 MCG (1000 UT) capsule Take 2 capsules (2,000 Units total) by mouth daily. 60 capsule 0   dronabinol (MARINOL) 5 MG capsule Take 1 capsule (5 mg total) by mouth 2 (two) times daily before a meal. 60 capsule 0   Garlic 1000 MG CAPS Take 1,000 mg by mouth daily.     letrozole (FEMARA) 2.5 MG tablet Take 1 tablet (2.5 mg total) by mouth daily. 30 tablet 6   Multiple Vitamins-Minerals (CENTRUM ADULTS PO) Take 1 tablet by mouth.     Omega 3 1200 MG CAPS Take 1,200 mg by mouth daily.     psyllium (REGULOID) 0.52 G capsule Take 2.08 g by mouth daily.     No current facility-administered medications for this visit.    VITAL SIGNS: There were no vitals taken for this visit. There were no vitals filed for this visit.  Estimated body mass index is 26.94 kg/m as calculated from the following:   Height as of 06/25/23: 5\' 7"  (1.702 m).   Weight as of 06/25/23: 172 lb (78 kg).  LABS: CBC:    Component Value Date/Time   WBC 8.3 06/25/2023 1057   WBC 7.7 06/18/2023 0502   HGB 12.2 06/25/2023 1057   HCT 37.7 06/25/2023 1057   PLT 463 (H) 06/25/2023 1057   MCV 76.6 (L) 06/25/2023 1057   NEUTROABS 5.7 06/25/2023 1057   LYMPHSABS 1.8 06/25/2023 1057   MONOABS 0.4 06/25/2023 1057   EOSABS 0.2 06/25/2023 1057   BASOSABS 0.1 06/25/2023 1057   Comprehensive Metabolic Panel:    Component Value Date/Time   NA 141 06/25/2023 1057   K 3.2 (L) 06/25/2023 1057   CL 102 06/25/2023 1057   CO2 27 06/25/2023 1057   BUN 7 (L) 06/25/2023 1057    CREATININE 0.92 06/25/2023 1057   GLUCOSE 147 (H) 06/25/2023 1057   CALCIUM 8.8 (L) 06/25/2023 1057   AST 23 06/25/2023 1057   ALT 19 06/25/2023 1057   ALKPHOS 218 (H) 06/25/2023 1057   BILITOT 0.4 06/25/2023 1057   PROT 7.5 06/25/2023 1057   ALBUMIN 3.5 06/25/2023 1057    RADIOGRAPHIC STUDIES: MR THORACIC SPINE W WO CONTRAST  Result Date: 06/16/2023 CLINICAL DATA:  Abnormal CT.  Diffuse spinal metastases. EXAM: MRI THORACIC WITHOUT AND WITH CONTRAST TECHNIQUE: Multiplanar and multiecho pulse sequences of the thoracic spine were obtained without and with intravenous contrast. CONTRAST:  7mL GADAVIST GADOBUTROL 1 MMOL/ML IV SOLN COMPARISON:  CT chest without contrast 06/11/2023. FINDINGS: Alignment:  No significant listhesis is present. Vertebrae: Diffuse abnormal marrow signal and enhancement is consistent with extensive malignancy vertebral body heights are maintained. No pathologic fractures are present. No extraosseous enhancement or expansion of the bone is present. Cord:  Normal signal and morphology. Paraspinal and other soft tissues: Large bilateral pleural effusions are present, right greater than left. Paraspinous soft tissues are otherwise unremarkable. Disc levels: A left paramedian disc protrusion is present at T6-7 without significant stenosis. Broad-based disc protrusions at T8-9, T9-10 and T10-11 partially effaces the ventral CSF without significant stenosis at these levels. Facet hypertrophy contributes to mild right foraminal narrowing at T9-10 and T10-11. The foramina are otherwise patent bilaterally. IMPRESSION: 1. Diffuse abnormal marrow signal and enhancement consistent with extensive malignancy. This may represent metastatic disease or multiple myeloma. 2. No pathologic fractures. 3. Mild right foraminal narrowing at T9-10 and T10-11. 4. Large bilateral pleural effusions, right greater than left. Electronically Signed   By: Marin Roberts M.D.   On: 06/16/2023 14:04   MR  Lumbar Spine W Wo Contrast  Result Date: 06/16/2023 CLINICAL DATA:  Musculoskeletal neoplasm.  Abnormal CT scan. EXAM: MRI LUMBAR SPINE WITHOUT AND WITH CONTRAST TECHNIQUE: Multiplanar and multiecho pulse sequences of the lumbar spine were obtained without and with intravenous contrast. CONTRAST:  7mL GADAVIST GADOBUTROL 1 MMOL/ML IV SOLN COMPARISON:  CT of the abdomen pelvis with contrast 06/11/2023. FINDINGS: Segmentation: A partially sacralized transitional L5 segment is noted. Four other non rib-bearing lumbar type vertebral bodies are present. The lowest fully formed vertebral body is L5. Alignment: No significant listhesis is present. Lumbar lordosis is normal. Vertebrae: Vertebral body heights are normal. Diffuse infiltrative disease is present throughout the marrow spaces. Innumerable enhancing lesions are present without expansion of the bone. No pathologic fractures are present. Conus medullaris and cauda equina: Conus extends to the L1 level. Conus and cauda equina appear normal. Paraspinal and other soft tissues: A 2.5 cm simple cyst is present at the upper pole of the left kidney. No other focal lesions are present. No significant adenopathy is present. Disc levels: T12-L1:  Normal disc signal and height is present. No focal protrusion or stenosis is present. L1-2: Normal disc signal and height is present. No focal protrusion or stenosis is present. L2-3: A bright were disc protrusion is present. Mild facet hypertrophy is present bilaterally. No significant stenosis is present. L3-4: A broad-based disc protrusion is asymmetric to the left. Mild facet hypertrophy is noted bilaterally. No focal stenosis is present. L4-5: Chronic loss of disc height present. A mild disc protrusion extends into the foramina bilaterally. No significant stenosis is present. L5-S1: Normal disc signal and height is present. No focal protrusion or stenosis is present. IMPRESSION: 1. Innumerable enhancing lesions throughout the  marrow spaces consistent with diffuse osseous metastatic disease. 2. No pathologic fractures. 3. Mild disc disease and facet hypertrophy as described without significant stenosis. 4. 2.5 cm simple cyst in the left kidney. No follow-up imaging is recommended. [employer] 2021; [id number], Bosniak Classification of Cystic Renal Masses, Version 2019. Electronically Signed   By: Marin Roberts M.D.   On: 06/16/2023 13:53    PERFORMANCE STATUS (ECOG) : {CHL ONC ECOG ZO:[id number]}  Review of Systems Unless otherwise noted, a complete review of systems is negative.  Physical Exam General: NAD Cardiovascular: regular rate and rhythm Pulmonary: clear ant fields Abdomen: soft, nontender, + bowel sounds Extremities: no edema, no joint deformities Skin: no rashes Neurological:  IMPRESSION: *** I introduced myself, Kendria Halberg RN, and Palliative's role in collaboration with the oncology team. Concept of Palliative Care was introduced as specialized medical care for people and their families living with serious illness.  It focuses on providing relief from the symptoms and stress of a serious illness.  The goal is to improve quality of life for both the patient and the family. Values and goals of care important to patient and family were attempted to be elicited.    We discussed *** current illness and what it means in the larger context of *** on-going co-morbidities. Natural disease trajectory and expectations were discussed.  I discussed the importance of continued conversation with family and their medical providers regarding overall plan of care and treatment options, ensuring decisions are within the context of the patients values and GOCs.  PLAN: Established therapeutic relationship. Education provided on palliative's role in collaboration with their Oncology/Radiation team. I will plan to see patient back in 2-4 weeks in collaboration to other oncology appointments.    Patient expressed  understanding and was in agreement with this plan. She also understands that She can call the clinic at any time with any questions, concerns, or complaints.   Thank you for your referral and allowing Palliative to assist in Mrs. Karina White's care.   Number and complexity of problems addressed: ***HIGH - 1 or more chronic illnesses with SEVERE exacerbation, progression, or side effects of treatment - advanced cancer, pain. Any controlled substances utilized were prescribed in the context of palliative care.   Visit consisted of counseling and education dealing with the complex and emotionally intense issues of symptom management and palliative care in the setting of serious and potentially life-threatening illness.Greater than 50%  of this time was spent counseling and coordinating care related to the above assessment and plan.  Signed by: Willette Alma, AGPCNP-BC Palliative Medicine Team/[facility]   *Please note that this is a verbal dictation therefore any spelling or grammatical errors are due to the "[employer] One" system interpretation.

## 2023-07-01 NOTE — Progress Notes (Signed)
Breast prognostics need to be obtained to make treatment plan. This cannot be completed on the pathology we have received thus far. Dr [name] needs patient to have a MM to determine if there is any breast mass that can be biopsied to obtain prognostics.  Patient mm had still not been scheduled. Reached out to GI and they stated they had reached out to patient unsuccessfully and patient has never returned their call. Message left on patient, and her daughters voicemail instructing her to schedule MM.   MM scheduled today for 07/11/2023.  Patient has already been scheduled for PET on 07/04/2023.  Foundation One results still pending.   Oncology Nurse Navigator Documentation     07/01/2023    1:15 PM  Oncology Nurse Navigator Flowsheets  Navigator Follow Up Date: 07/04/2023  Navigator Follow Up Reason: Scan Review  Navigator Location [facility]  Navigator Encounter Type Appt/Treatment Plan Review;Telephone  Telephone Outgoing Call  Patient Visit Type MedOnc  Treatment Phase Pre-Tx/Tx Discussion  Barriers/Navigation Needs Coordination of Care;Education  Interventions Coordination of Care;Education  Acuity Level 2-Minimal Needs (1-2 Barriers Identified)  Coordination of Care Other  Education Method Verbal  Time Spent with Patient 30

## 2023-07-02 ENCOUNTER — Inpatient Hospital Stay: Payer: Medicare Other | Admitting: Nurse Practitioner

## 2023-07-03 ENCOUNTER — Telehealth: Payer: Self-pay | Admitting: Nurse Practitioner

## 2023-07-03 NOTE — Telephone Encounter (Signed)
  Called and left VM for patient to call back to reschdule appt.

## 2023-07-04 ENCOUNTER — Ambulatory Visit (HOSPITAL_COMMUNITY)
Admission: RE | Admit: 2023-07-04 | Discharge: 2023-07-04 | Disposition: A | Discharge status: Home / Self Care | Payer: Medicare Other | Source: Ambulatory Visit | Attending: Hematology & Oncology | Admitting: Hematology & Oncology

## 2023-07-04 DIAGNOSIS — C7951 Secondary malignant neoplasm of bone: Secondary | ICD-10-CM | POA: Insufficient documentation

## 2023-07-04 DIAGNOSIS — G893 Neoplasm related pain (acute) (chronic): Secondary | ICD-10-CM | POA: Insufficient documentation

## 2023-07-04 LAB — GLUCOSE, CAPILLARY: Glucose-Capillary: 112 mg/dL — ABNORMAL HIGH (ref 70–99)

## 2023-07-04 MED ORDER — FLUDEOXYGLUCOSE F - 18 (FDG) INJECTION
8.6000 | Freq: Once | INTRAVENOUS | Status: AC
  Administered 2023-07-04: 8.6 via INTRAVENOUS

## 2023-07-10 ENCOUNTER — Inpatient Hospital Stay: Payer: Medicare Other | Admitting: Dietician

## 2023-07-10 ENCOUNTER — Inpatient Hospital Stay: Payer: Medicare Other | Admitting: Licensed Clinical Social Worker

## 2023-07-10 NOTE — Progress Notes (Signed)
[facility] Clinical Social Work  Initial Assessment   Karina White is a 67 y.o. year old female contacted by phone. Clinical Social Work was referred by nurse navigator for assessment of psychosocial needs.   SDOH (Social Determinants of Health) assessments performed: Yes   SDOH Screenings   Food Insecurity: No Food Insecurity (06/15/2023)  Housing: Low Risk  (06/15/2023)  Transportation Needs: No Transportation Needs (06/15/2023)  Utilities: Not At Risk (06/15/2023)  Tobacco Use: Unknown (06/25/2023)     Distress Screen completed: No     No data to display            Family/Social Information:  Housing Arrangement: patient lives alone. Family members/support persons in your life? Patient stated she has several family members, including a son and daughter who live close by.  She also has several friends.  Her husband died in 08-07-20. 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 Services Currently in place:  Medicare  Coping/ Adjustment to diagnosis: Patient understands treatment plan and what happens next? yes Concerns about diagnosis and/or treatment: I'm not especially worried about anything Patient reported stressors: Adjusting to my illness Hopes and/or priorities: God Patient enjoys time with family/ friends Current coping skills/ strengths: Capable of independent living , Manufacturing systems engineer , Contractor , General fund of knowledge , Religious Affiliation , and Supportive family/friends     SUMMARY: Current SDOH Barriers:  None per patient.  Clinical Social Work Clinical Goal(s):  No clinical social work goals at this time  Interventions: Discussed common feeling and emotions when being diagnosed with cancer, and the importance of support  during treatment Informed patient of the support team roles and support services at [facility] Provided CSW contact information and encouraged patient to call with any questions or concerns Provided patient with information about the [employer] program and will mail literature.   Follow Up Plan: Patient will contact CSW with any support or resource needs Patient verbalizes understanding of plan: Yes    Dorothey Baseman, LCSW Clinical Social Worker [facility]

## 2023-07-10 NOTE — Progress Notes (Signed)
Nutrition Assessment: Reached out to patient at home telephone number.    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  Her treatment is being planned by Dr. [name].  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  She has lost a significant amount of weight this past month with foods tasting like metal and not staying down.  She prayed to God for help and the past 2 days has had some relief.  Over past few years she had wanted to lose some weight and was being very mindful about her dietary choices (no canned or processed food, limiting sodium intake, limiting fried foods).  She reports she eats lots of baked fish (eats out at restaurants she doesn't cook it at home) and chicken, eggs, greens, pinto beans, tomatoes.  Fluids: "Lots of water", apple juice (switched to tea), likes OJ worried about acid, Sprite, uses Gingerale and Ginger hard candies for nausea.   Nutrition Focused Physical Exam: unable to perform NFPE   Medications: Zofran,    Labs: 06/25/23  K+3.2, Calcium 8.8,    Anthropometrics: she reports losing 38# (18%) past month, although weight on file is just 8#  Height: 67" Weight:  06/25/23  172#  06/11/23  180# UBW: 210# BMI: 26.94   Estimated Energy Needs  Kcals: 2300 Protein: 94-117 g Fluid: 2.3 L   NUTRITION DIAGNOSIS: Inadequate PO intake r/t nausea and dysgeusia to meet increased nutrient needs with cancer  INTERVENTION:   Relayed that nutrition services are wrap around service provided at no charge and encouraged continued communication if  experiencing any nutritional impact symptoms (NIS). Educated on importance of adequate nourishment with calorie and protein energy intake with nutrient dense foods when possible to maintain weight/strength and QOL.   Relayed strategies to manage nausea and taste changes (sent tips sheets) Encouraged frequent feeds with attention to protein foods at all  meals/snacks  Emailed ([email or web address]  Nutrition During Cancer Treatment, nausea and taste changes with contact information provided.  MONITORING, EVALUATION, GOAL: weight trends, nutrition impact symptoms, PO intake, labs  Weight maintenance  Next Visit: remote next month  Gennaro Africa, RDN, LDN Registered Dietitian, [facility] Part Time Remote (Usual office hours: Tuesday-Thursday) Mobile: [phone]

## 2023-07-11 ENCOUNTER — Other Ambulatory Visit: Payer: Self-pay | Admitting: Hematology & Oncology

## 2023-07-11 ENCOUNTER — Ambulatory Visit
Admission: RE | Admit: 2023-07-11 | Discharge: 2023-07-11 | Disposition: A | Discharge status: Home / Self Care | Payer: Medicare Other | Source: Ambulatory Visit | Attending: Hematology & Oncology | Admitting: Hematology & Oncology

## 2023-07-11 DIAGNOSIS — N6320 Unspecified lump in the left breast, unspecified quadrant: Secondary | ICD-10-CM

## 2023-07-11 DIAGNOSIS — C7951 Secondary malignant neoplasm of bone: Secondary | ICD-10-CM

## 2023-07-16 ENCOUNTER — Ambulatory Visit
Admission: RE | Admit: 2023-07-16 | Discharge: 2023-07-16 | Disposition: A | Discharge status: Home / Self Care | Payer: Medicare Other | Source: Ambulatory Visit | Attending: Hematology & Oncology | Admitting: Hematology & Oncology

## 2023-07-16 ENCOUNTER — Telehealth: Payer: Self-pay | Admitting: Nurse Practitioner

## 2023-07-16 DIAGNOSIS — N6320 Unspecified lump in the left breast, unspecified quadrant: Secondary | ICD-10-CM

## 2023-07-16 HISTORY — PX: BREAST BIOPSY: SHX20

## 2023-07-17 ENCOUNTER — Telehealth: Payer: Self-pay | Admitting: Hematology & Oncology

## 2023-07-17 LAB — SURGICAL PATHOLOGY

## 2023-07-17 NOTE — Telephone Encounter (Signed)
left vm for pt regarding sch appt for follow up on friday 07/19/2023  TC 07/17/2023 1443

## 2023-07-18 ENCOUNTER — Encounter (HOSPITAL_COMMUNITY): Payer: Self-pay

## 2023-07-19 ENCOUNTER — Encounter: Payer: Self-pay | Admitting: *Deleted

## 2023-07-19 NOTE — Progress Notes (Signed)
Reviewed patient's baseline PET scan. Plan for patient to be seen Monday for treatment discussion.   Oncology Nurse Navigator Documentation     07/19/2023    2:00 PM  Oncology Nurse Navigator Flowsheets  Navigator Follow Up Date: 07/22/2023  Navigator Follow Up Reason: Follow-up Appointment  Navigator Location [facility]  Navigator Encounter Type Scan Review  Patient Visit Type MedOnc  Treatment Phase Pre-Tx/Tx Discussion  Barriers/Navigation Needs Coordination of Care;Education  Interventions Coordination of Care  Acuity Level 2-Minimal Needs (1-2 Barriers Identified)  Coordination of Care Appts  Support Groups/Services Friends and Family  Time Spent with Patient 15

## 2023-07-19 NOTE — Progress Notes (Signed)
Documents emailed to [employer] per request from Deberah Castle RN

## 2023-07-19 NOTE — Progress Notes (Signed)
Per Dr [name], request for [facility] testing sent on specimen [id number] DOS 07/16/2023.  Oncology Nurse Navigator Documentation     07/19/2023    9:45 AM  Oncology Nurse Navigator Flowsheets  Navigator Location [facility]  Navigator Encounter Type Molecular Studies  Patient Visit Type MedOnc  Treatment Phase Pre-Tx/Tx Discussion  Barriers/Navigation Needs Coordination of Care;Education  Interventions Coordination of Care  Acuity Level 2-Minimal Needs (1-2 Barriers Identified)  Coordination of Care Pathology  Time Spent with Patient 30

## 2023-07-22 ENCOUNTER — Telehealth: Payer: Self-pay | Admitting: Pharmacist

## 2023-07-22 ENCOUNTER — Ambulatory Visit (HOSPITAL_BASED_OUTPATIENT_CLINIC_OR_DEPARTMENT_OTHER)
Admission: RE | Admit: 2023-07-22 | Discharge: 2023-07-22 | Disposition: A | Discharge status: Home / Self Care | Payer: Medicare Other | Source: Ambulatory Visit | Attending: Hematology & Oncology | Admitting: Hematology & Oncology

## 2023-07-22 ENCOUNTER — Inpatient Hospital Stay (HOSPITAL_BASED_OUTPATIENT_CLINIC_OR_DEPARTMENT_OTHER): Payer: Medicare Other | Admitting: Hematology & Oncology

## 2023-07-22 ENCOUNTER — Inpatient Hospital Stay: Payer: Medicare Other | Attending: Hematology & Oncology

## 2023-07-22 ENCOUNTER — Other Ambulatory Visit (HOSPITAL_COMMUNITY): Payer: Self-pay

## 2023-07-22 ENCOUNTER — Other Ambulatory Visit: Payer: Self-pay | Admitting: *Deleted

## 2023-07-22 VITALS — BP 167/94 | HR 96 | Temp 97.8°F | Resp 19 | Ht 67.0 in | Wt 170.0 lb

## 2023-07-22 DIAGNOSIS — J90 Pleural effusion, not elsewhere classified: Secondary | ICD-10-CM | POA: Diagnosis not present

## 2023-07-22 DIAGNOSIS — J984 Other disorders of lung: Secondary | ICD-10-CM | POA: Insufficient documentation

## 2023-07-22 DIAGNOSIS — Z1721 Progesterone receptor positive status: Secondary | ICD-10-CM | POA: Diagnosis not present

## 2023-07-22 DIAGNOSIS — C50919 Malignant neoplasm of unspecified site of unspecified female breast: Secondary | ICD-10-CM

## 2023-07-22 DIAGNOSIS — C50912 Malignant neoplasm of unspecified site of left female breast: Secondary | ICD-10-CM | POA: Insufficient documentation

## 2023-07-22 DIAGNOSIS — Z1731 Human epidermal growth factor receptor 2 positive status: Secondary | ICD-10-CM | POA: Insufficient documentation

## 2023-07-22 DIAGNOSIS — C7951 Secondary malignant neoplasm of bone: Secondary | ICD-10-CM | POA: Insufficient documentation

## 2023-07-22 DIAGNOSIS — C782 Secondary malignant neoplasm of pleura: Secondary | ICD-10-CM | POA: Diagnosis not present

## 2023-07-22 DIAGNOSIS — Z79811 Long term (current) use of aromatase inhibitors: Secondary | ICD-10-CM | POA: Diagnosis not present

## 2023-07-22 DIAGNOSIS — Z170 Estrogen receptor positive status [ER+]: Secondary | ICD-10-CM | POA: Diagnosis not present

## 2023-07-22 LAB — CBC WITH DIFFERENTIAL (CANCER CENTER ONLY)
Abs Immature Granulocytes: 0.02 10*3/uL (ref 0.00–0.07)
Basophils Absolute: 0.1 10*3/uL (ref 0.0–0.1)
Basophils Relative: 1 %
Eosinophils Absolute: 0.3 10*3/uL (ref 0.0–0.5)
Eosinophils Relative: 4 %
HCT: 37.1 % (ref 36.0–46.0)
Hemoglobin: 11.8 g/dL — ABNORMAL LOW (ref 12.0–15.0)
Immature Granulocytes: 0 %
Lymphocytes Relative: 34 %
Lymphs Abs: 2.3 10*3/uL (ref 0.7–4.0)
MCH: 24.7 pg — ABNORMAL LOW (ref 26.0–34.0)
MCHC: 31.8 g/dL (ref 30.0–36.0)
MCV: 77.8 fL — ABNORMAL LOW (ref 80.0–100.0)
Monocytes Absolute: 0.3 10*3/uL (ref 0.1–1.0)
Monocytes Relative: 5 %
Neutro Abs: 4 10*3/uL (ref 1.7–7.7)
Neutrophils Relative %: 56 %
Platelet Count: 287 10*3/uL (ref 150–400)
RBC: 4.77 MIL/uL (ref 3.87–5.11)
RDW: 19.7 % — ABNORMAL HIGH (ref 11.5–15.5)
WBC Count: 6.9 10*3/uL (ref 4.0–10.5)
nRBC: 0.3 % — ABNORMAL HIGH (ref 0.0–0.2)

## 2023-07-22 LAB — COMPREHENSIVE METABOLIC PANEL
ALT: 14 U/L (ref 0–44)
AST: 20 U/L (ref 15–41)
Albumin: 4 g/dL (ref 3.5–5.0)
Alkaline Phosphatase: 270 U/L — ABNORMAL HIGH (ref 38–126)
Anion gap: 9 (ref 5–15)
BUN: 6 mg/dL — ABNORMAL LOW (ref 8–23)
CO2: 29 mmol/L (ref 22–32)
Calcium: 9.2 mg/dL (ref 8.9–10.3)
Chloride: 106 mmol/L (ref 98–111)
Creatinine, Ser: 0.91 mg/dL (ref 0.44–1.00)
GFR, Estimated: >60 mL/min (ref >60)
Glucose, Bld: 162 mg/dL — ABNORMAL HIGH (ref 70–99)
Potassium: 3.9 mmol/L (ref 3.5–5.1)
Sodium: 144 mmol/L (ref 135–145)
Total Bilirubin: 0.5 mg/dL (ref <1.2)
Total Protein: 7.4 g/dL (ref 6.5–8.1)

## 2023-07-22 LAB — RETICULOCYTES
Immature Retic Fract: 20.5 % — ABNORMAL HIGH (ref 2.3–15.9)
RBC.: 4.79 MIL/uL (ref 3.87–5.11)
Retic Count, Absolute: 70.4 10*3/uL (ref 19.0–186.0)
Retic Ct Pct: 1.5 % (ref 0.4–3.1)

## 2023-07-22 LAB — LACTATE DEHYDROGENASE: LDH: 310 U/L — ABNORMAL HIGH (ref 98–192)

## 2023-07-22 LAB — FERRITIN: Ferritin: 543 ng/mL — ABNORMAL HIGH (ref 11–307)

## 2023-07-22 MED ORDER — CARVEDILOL PHOSPHATE ER 10 MG PO CP24: 10.0000 mg | ORAL_CAPSULE | Freq: Every day | ORAL | 4 refills | Status: DC

## 2023-07-22 MED ORDER — RIBOCICLIB SUCC (600 MG DOSE) 200 MG PO TBPK
600.0000 mg | ORAL_TABLET | Freq: Every day | ORAL | 4 refills | Status: DC
  Filled 2023-07-30: qty 63, 28d supply, fill #0

## 2023-07-22 MED ORDER — RIBOCICLIB SUCC (600 MG DOSE) 200 MG PO TBPK: 600.0000 mg | ORAL_TABLET | Freq: Every day | ORAL | 4 refills | Status: DC

## 2023-07-22 NOTE — Progress Notes (Unsigned)
Hematology and Oncology Follow Up Visit  Markenzie Mcdonnough [id number] 1956/01/06 67 y.o. 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 Shay is back for her first office visit.  [facility] had [facility]  She came in because of severe bony pain.  She had extensive spinal metastasis by CT/MRI.  She also had a pleural effusion.  We went and did a thoracentesis.  This was on the right side.  This was done on 06/17/2023.  The pathology report ([id number]) showed malignant cells that were consistent with adenocarcinoma of the breast.  I think the cells were ER positive.  She had been anemic.  We went and did a bone marrow biopsy on her.  This was done also on 06/17/2023.  The pathology report ([id number]) showed hypercellular marrow with metastatic carcinoma.  The cells were positive for estrogen receptor.  Unfortunately, really no other testing could be done.  She is feeling better.  She comes in with her daughter.  Her CA 27.29 has been elevated.  Today, the level is 167.  She has not had a mammogram for quite a while.  I suspect that she is going to have an abnormal mammogram.  Will try to get this set up within a week.  On her physical exam, it looks like there may be something with the left breast.  We really need to get prognostic markers.  I will have to see if we can maybe get [facility] studies.  She did get iron while she was in the hospital.  She was iron deficient.  I think we also need to get a PET scan on her.  Thankfully, the MRI of the spine that she had done did not show any obvious pathologic fractures.  She has had no problems going to the bathroom.  She has had no bleeding.  Her appetite is down a little bit.  I appreciate the fact that she is certainly aware and concerned over the diagnosis.  Currently, I would  say her performance status is probably ECOG 1.    Medications:  Current Outpatient Medications:  .  acetaminophen (TYLENOL) 325 MG tablet, Take 2 tablets (650 mg total) by mouth every 6 (six) hours as needed for mild pain, fever or headache., Disp: 20 tablet, Rfl: 0 .  amLODipine (NORVASC) 10 MG tablet, Take 1 tablet (10 mg total) by mouth daily., Disp: 30 tablet, Rfl: 0 .  aspirin EC 81 MG tablet, Take 81 mg by mouth daily., Disp: , Rfl:  .  carvedilol (COREG CR) 10 MG 24 hr capsule, Take 1 capsule (10 mg total) by mouth daily., Disp: 30 capsule, Rfl: 0 .  Cholecalciferol 25 MCG (1000 UT) capsule, Take 2 capsules (2,000 Units total) by mouth daily., Disp: 60 capsule, Rfl: 0 .  Garlic 1000 MG CAPS, Take 1,000 mg by mouth daily., Disp: , Rfl:  .  letrozole (FEMARA) 2.5 MG tablet, Take 1 tablet (2.5 mg total) by mouth daily., Disp: 30 tablet, Rfl: 6 .  Multiple Vitamins-Minerals (CENTRUM ADULTS PO), Take 1 tablet by mouth., Disp: , Rfl:  .  Omega 3 1200 MG CAPS, Take 1,200 mg by mouth daily., Disp: , Rfl:   Allergies:  Allergies  Allergen Reactions  . Penicillins Hives and Itching  . Codeine Nausea And Vomiting    Past Medical History, Surgical history, Social history, and  Family History were reviewed and updated.  Review of Systems: Review of Systems  Constitutional:  Positive for appetite change and unexpected weight change.  HENT:  Negative.    Eyes: Negative.   Respiratory: Negative.    Cardiovascular:  Positive for chest pain.  Gastrointestinal: Negative.   Endocrine: Negative.   Genitourinary: Negative.    Musculoskeletal:  Positive for back pain.  Skin: Negative.   Neurological: Negative.   Hematological: Negative.   Psychiatric/Behavioral: Negative.      Physical Exam:  height is 5\' 7"  (1.702 m) and weight is 170 lb (77.1 kg). Her oral temperature is 97.8 F (36.6 C). Her blood pressure is 167/94 (abnormal) and her pulse is 96. Her respiration is 19 and oxygen  saturation is 100%.   Wt Readings from Last 3 Encounters:  07/22/23 170 lb (77.1 kg)  06/25/23 172 lb (78 kg)  06/15/23 175 lb 4.3 oz (79.5 kg)    Physical Exam Vitals reviewed.  Constitutional:      Comments: Her breast exam shows right breast with no masses, edema or erythema.  There is no right axillary adenopathy.  Left breast does show some irregularity and some firmness at about the 12 o'clock position.  There is no tenderness.  There is no nipple discharge.  There is no left axillary adenopathy.  HENT:     Head: Normocephalic and atraumatic.  Eyes:     Pupils: Pupils are equal, round, and reactive to light.  Cardiovascular:     Rate and Rhythm: Normal rate and regular rhythm.     Heart sounds: Normal heart sounds.  Pulmonary:     Effort: Pulmonary effort is normal.     Breath sounds: Normal breath sounds.  Abdominal:     General: Bowel sounds are normal.     Palpations: Abdomen is soft.     Comments: Abdominal exam is soft.  She has decent bowel sounds.  There is no fluid wave.  There is no palpable liver or spleen tip.  Musculoskeletal:        General: No tenderness or deformity. Normal range of motion.     Cervical back: Normal range of motion.     Comments: Back exam shows some slight tenderness to palpation over the thoracic and lumbar spine.  Lymphadenopathy:     Cervical: No cervical adenopathy.  Skin:    General: Skin is warm and dry.     Findings: No erythema or rash.  Neurological:     Mental Status: She is alert and oriented to person, place, and time.  Psychiatric:        Behavior: Behavior normal.        Thought Content: Thought content normal.        Judgment: Judgment normal.     Lab Results  Component Value Date   WBC 6.9 07/22/2023   HGB 11.8 (L) 07/22/2023   HCT 37.1 07/22/2023   MCV 77.8 (L) 07/22/2023   PLT 287 07/22/2023     Chemistry      Component Value Date/Time   NA 141 06/25/2023 1057   K 3.2 (L) 06/25/2023 1057   CL 102  06/25/2023 1057   CO2 27 06/25/2023 1057   BUN 7 (L) 06/25/2023 1057   CREATININE 0.92 06/25/2023 1057      Component Value Date/Time   CALCIUM 8.8 (L) 06/25/2023 1057   ALKPHOS 218 (H) 06/25/2023 1057   AST 23 06/25/2023 1057   ALT 19 06/25/2023 1057   BILITOT 0.4  06/25/2023 1057      Impression and Plan: Ms. Yanagi is a very nice 67 year old Afro-American female.  She has stage IV adenocarcinoma of the breast.  Again I suspect that this is going to come from the left breast just by her exam.  Again it is can be critical that we get the prognostic markers.  Again, looks like from the bone marrow, the tumors are ER positive.  I would like to think that if we find something on mammogram, that we would be able to get a biopsy of the actual tumor.  I may go ahead and get her on some antiestrogen therapy right now.  Again, we can at least get her on something that we can use.  The HER2 status is can be critical.  If she is HER2 positive, then we will definitely have to use chemotherapy with anti-HER2 treatment.  We will have to see what the PET scan shows.  Hopefully this will be done in a week.  I did set up the mammogram.  Hopefully this will be done in a week or so.  I will hold off on doing any MRIs of the brain for right now.  Hopefully, we will be able to get a long-lasting response with therapy.  Hopefully we might be able to use antiestrogen therapy on her.  I will plan to get her back depend on what we find with our PET scan and with the mammogram.  She also will probably need Zometa or Xgeva to help with the bones.     Josph Macho, MD 11/11/20242:41 PM

## 2023-07-23 ENCOUNTER — Telehealth: Payer: Self-pay | Admitting: Dietician

## 2023-07-23 ENCOUNTER — Other Ambulatory Visit: Payer: Self-pay

## 2023-07-23 ENCOUNTER — Inpatient Hospital Stay: Payer: Medicare Other | Admitting: Dietician

## 2023-07-23 ENCOUNTER — Encounter: Payer: Self-pay | Admitting: Hematology & Oncology

## 2023-07-23 ENCOUNTER — Telehealth: Payer: Self-pay | Admitting: *Deleted

## 2023-07-23 ENCOUNTER — Encounter: Payer: Self-pay | Admitting: *Deleted

## 2023-07-23 ENCOUNTER — Telehealth: Payer: Self-pay

## 2023-07-23 LAB — IRON AND IRON BINDING CAPACITY (CC-WL,HP ONLY)
Iron: 56 ug/dL (ref 28–170)
Saturation Ratios: 21 % (ref 10.4–31.8)
TIBC: 269 ug/dL (ref 250–450)
UIBC: 213 ug/dL (ref 148–442)

## 2023-07-23 LAB — CANCER ANTIGEN 27.29: CA 27.29: 148.5 U/mL — ABNORMAL HIGH (ref 0.0–38.6)

## 2023-07-23 MED ORDER — CARVEDILOL PHOSPHATE ER 10 MG PO CP24: 10.0000 mg | ORAL_CAPSULE | Freq: Every day | ORAL | 4 refills | Status: DC

## 2023-07-23 NOTE — Telephone Encounter (Signed)
Talked with patient to let her know that Dr [name] noted fluid on her lung on the CXR and this will need to come off.  They will be calling to schedule this in next few days.  Patient informed about need for EKG prior to starting Kisquali.  Patient needs to sign paperwork for [facility] patient assistance and bring SSN paperwork for [facility] to start process.  Appt made for Wednesday at 1230

## 2023-07-23 NOTE — Telephone Encounter (Signed)
Oral Oncology Patient Advocate Encounter   **Patient has no prescription insurance coverage**  Began application for assistance for Kisqali through [employer] Patient Support to be triaged to [employer].   Application will be submitted upon completion of necessary supporting documentation.   [employer] Patient Support's phone number [phone].   I will continue to check the status until final determination.    Ardeen Fillers, CPhT Oncology Pharmacy Patient Advocate  [facility]  [phone] (phone) [phone] (fax) 07/23/2023 10:21 AM

## 2023-07-23 NOTE — Telephone Encounter (Signed)
Oral Oncology Pharmacist Encounter  Received new prescription for Kisqali (ribociclib) for the treatment of metastatic HR positive, HER-2 negative breast cancer in conjunction with letrozole, planned duration until disease progression or unacceptable drug toxicity.  CBC w/ Diff and CMP from 07/22/23 assessed, no relevant lab abnormalities requiring baseline dose adjustment required at this time. Patient will have baseline EKG prior to starting therapy. Prescription dose and frequency assessed for appropriateness.  Current medication list in Epic reviewed, DDIs with Kisqali identified: Category C drug-drug interaction between Kisqali and Amlodipine - Kisqali may increase serum concentrations of amlodipine, recommend monitoring patient for decreased BP, dizziness, etc. No changes in therapy warranted at this time.   Evaluated chart and no patient barriers to medication adherence noted.   Patient does not have prescription drug insurance, thus will have to proceed with patient assistance for patient at this time.   Oral Oncology Clinic will continue to follow for insurance authorization, copayment issues, initial counseling and start date.  Lenord Carbo, PharmD, BCPS, BCOP Hematology/Oncology Clinical Pharmacist [facility] Oral Chemotherapy Navigation Clinics [phone] 07/23/2023 9:20 AM

## 2023-07-23 NOTE — Progress Notes (Signed)
Patient molecular studies are pending, but at this time, she will start treatment with AI, Kisqali and Xgeva. Prescriptions sent to specialty pharmacy.   Patient will come to the office tomorrow for EKG. Will speak to patient then about her qualification for genetics and see if she is interested moving forward.   Oncology Nurse Navigator Documentation     07/23/2023    1:15 PM  Oncology Nurse Navigator Flowsheets  Phase of Treatment AI  Navigator Follow Up Date: 08/14/2023  Navigator Follow Up Reason: Follow-up Appointment  Navigator Location [facility]  Navigator Encounter Type Appt/Treatment Plan Review  Patient Visit Type MedOnc  Treatment Phase Active Tx  Barriers/Navigation Needs Coordination of Care;Education  Interventions None Required  Acuity Level 2-Minimal Needs (1-2 Barriers Identified)  Support Groups/Services Friends and Family  Time Spent with Patient 15

## 2023-07-23 NOTE — Telephone Encounter (Signed)
Attempted to reach patient for a scheduled remote nutrition consult. Provided my cell# on voice mail to return call for her follow up nutrition consult.  Cyndi Helder Crisafulli, RDN, LDN Registered Dietitian, [facility] Part Time Remote (Usual office hours: Tuesday-Thursday) Cell: [phone]    

## 2023-07-24 ENCOUNTER — Other Ambulatory Visit: Payer: Self-pay

## 2023-07-24 ENCOUNTER — Encounter: Payer: Self-pay | Admitting: *Deleted

## 2023-07-24 ENCOUNTER — Other Ambulatory Visit: Payer: Self-pay | Admitting: *Deleted

## 2023-07-24 ENCOUNTER — Inpatient Hospital Stay: Payer: Medicare Other

## 2023-07-24 DIAGNOSIS — I10 Essential (primary) hypertension: Secondary | ICD-10-CM

## 2023-07-24 DIAGNOSIS — C50919 Malignant neoplasm of unspecified site of unspecified female breast: Secondary | ICD-10-CM

## 2023-07-24 DIAGNOSIS — C7951 Secondary malignant neoplasm of bone: Secondary | ICD-10-CM | POA: Diagnosis not present

## 2023-07-24 MED ORDER — DENOSUMAB 120 MG/1.7ML ~~LOC~~ SOLN
120.0000 mg | Freq: Once | SUBCUTANEOUS | Status: DC
Start: 2023-07-24 — End: 2023-07-24

## 2023-07-24 MED ORDER — DENOSUMAB 120 MG/1.7ML ~~LOC~~ SOLN
120.0000 mg | Freq: Once | SUBCUTANEOUS | Status: AC
  Administered 2023-07-24: 120 mg via SUBCUTANEOUS
  Filled 2023-07-24: qty 1.7

## 2023-07-24 MED ORDER — CARVEDILOL PHOSPHATE ER 10 MG PO CP24
10.0000 mg | ORAL_CAPSULE | Freq: Every day | ORAL | 4 refills | Status: DC
  Filled 2023-08-29: qty 30, 30d supply, fill #0
  Filled 2023-09-05: qty 7, 7d supply, fill #0

## 2023-07-24 NOTE — Patient Instructions (Signed)
Denosumab Injection (Oncology) What is this medication? DENOSUMAB (den oh SUE mab) prevents weakened bones caused by cancer. It may also be used to treat noncancerous bone tumors that cannot be removed by surgery. It can also be used to treat high calcium levels in the blood caused by cancer. It works by blocking a protein that causes bones to break down quickly. This slows down the release of calcium from bones, which lowers calcium levels in your blood. It also makes your bones stronger and less likely to break (fracture). This medicine may be used for other purposes; ask your health care provider or pharmacist if you have questions. COMMON BRAND NAME(S): XGEVA What should I tell my care team before I take this medication? They need to know if you have any of these conditions: Dental disease Having surgery or tooth extraction Infection Kidney disease Low levels of calcium or vitamin D in the blood Malnutrition On hemodialysis Skin conditions or sensitivity Thyroid or parathyroid disease An unusual reaction to denosumab, other medications, foods, dyes, or preservatives Pregnant or trying to get pregnant Breast-feeding How should I use this medication? This medication is for injection under the skin. It is given by your care team in a hospital or clinic setting. A special MedGuide will be given to you before each treatment. Be sure to read this information carefully each time. Talk to your care team about the use of this medication in children. While it may be prescribed for children as young as 13 years for selected conditions, precautions do apply. Overdosage: If you think you have taken too much of this medicine contact a poison control center or emergency room at once. NOTE: This medicine is only for you. Do not share this medicine with others. What if I miss a dose? Keep appointments for follow-up doses. It is important not to miss your dose. Call your care team if you are unable to  keep an appointment. What may interact with this medication? Do not take this medication with any of the following: Other medications containing denosumab This medication may also interact with the following: Medications that lower your chance of fighting infection Steroid medications, such as prednisone or cortisone This list may not describe all possible interactions. Give your health care provider a list of all the medicines, herbs, non-prescription drugs, or dietary supplements you use. Also tell them if you smoke, drink alcohol, or use illegal drugs. Some items may interact with your medicine. What should I watch for while using this medication? Your condition will be monitored carefully while you are receiving this medication. You may need blood work while taking this medication. This medication may increase your risk of getting an infection. Call your care team for advice if you get a fever, chills, sore throat, or other symptoms of a cold or flu. Do not treat yourself. Try to avoid being around people who are sick. You should make sure you get enough calcium and vitamin D while you are taking this medication, unless your care team tells you not to. Discuss the foods you eat and the vitamins you take with your care team. Some people who take this medication have severe bone, joint, or muscle pain. This medication may also increase your risk for jaw problems or a broken thigh bone. Tell your care team right away if you have severe pain in your jaw, bones, joints, or muscles. Tell your care team if you have any pain that does not go away or that gets worse. Talk   to your care team if you may be pregnant. Serious birth defects can occur if you take this medication during pregnancy and for 5 months after the last dose. You will need a negative pregnancy test before starting this medication. Contraception is recommended while taking this medication and for 5 months after the last dose. Your care team  can help you find the option that works for you. What side effects may I notice from receiving this medication? Side effects that you should report to your care team as soon as possible: Allergic reactions--skin rash, itching, hives, swelling of the face, lips, tongue, or throat Bone, joint, or muscle pain Low calcium level--muscle pain or cramps, confusion, tingling, or numbness in the hands or feet Osteonecrosis of the jaw--pain, swelling, or redness in the mouth, numbness of the jaw, poor healing after dental work, unusual discharge from the mouth, visible bones in the mouth Side effects that usually do not require medical attention (report to your care team if they continue or are bothersome): Cough Diarrhea Fatigue Headache Nausea This list may not describe all possible side effects. Call your doctor for medical advice about side effects. You may report side effects to FDA at 1-800-FDA-1088. Where should I keep my medication? This medication is given in a hospital or clinic. It will not be stored at home. NOTE: This sheet is a summary. It may not cover all possible information. If you have questions about this medicine, talk to your doctor, pharmacist, or health care provider.  2024 Elsevier/Gold Standard (2022-01-17 00:00:00)  

## 2023-07-24 NOTE — Progress Notes (Signed)
Patient in the office today for pre-Kisqali EKG.   Spoke to patient about her qualification for genetic testing and the benefits to testing. Her daughter was in the room with her and also received education. Genetics flyer given to the patient to take home. They will think about this and let me know what they want.   Oncology Nurse Navigator Documentation     07/24/2023   12:45 PM  Oncology Nurse Navigator Flowsheets  Navigator Follow Up Date: 08/14/2023  Navigator Follow Up Reason: Follow-up Appointment  Navigator Location [facility]  Navigator Encounter Type Treatment  Patient Visit Type MedOnc  Treatment Phase Active Tx  Barriers/Navigation Needs Coordination of Care;Education  Education Other  Interventions Education  Acuity Level 2-Minimal Needs (1-2 Barriers Identified)  Education Method Verbal;Written  Support Groups/Services Friends and Family  Time Spent with Patient 15

## 2023-07-24 NOTE — Telephone Encounter (Signed)
Oral Oncology Patient Advocate Encounter   Submitted application for assistance for Kisqali to [employer] Patient Support.   Application submitted via e-fax to [phone]   [employer] Patient Support's phone number [phone].   I will continue to check the status until final determination.    Ardeen Fillers, CPhT Oncology Pharmacy Patient Advocate  [facility]  [phone] (phone) [phone] (fax) 07/24/2023 4:05 PM

## 2023-07-24 NOTE — Progress Notes (Signed)
Patient received Zometa on 06/16/23 for bone pain. Ok for patient to receive Rivka Barbara today per MD.  Richardean Sale, RPH, BCPS, BCOP 07/24/2023 1:32 PM

## 2023-07-25 ENCOUNTER — Encounter: Payer: Self-pay | Admitting: Hematology & Oncology

## 2023-07-25 ENCOUNTER — Ambulatory Visit (HOSPITAL_COMMUNITY)
Admission: RE | Admit: 2023-07-25 | Discharge: 2023-07-25 | Disposition: A | Discharge status: Home / Self Care | Payer: Medicare Other | Source: Ambulatory Visit | Attending: Hematology & Oncology | Admitting: Hematology & Oncology

## 2023-07-25 ENCOUNTER — Ambulatory Visit (HOSPITAL_COMMUNITY)
Admission: RE | Admit: 2023-07-25 | Discharge: 2023-07-25 | Disposition: A | Discharge status: Home / Self Care | Payer: Medicare Other | Source: Ambulatory Visit | Attending: Radiology | Admitting: Radiology

## 2023-07-25 VITALS — BP 164/97

## 2023-07-25 DIAGNOSIS — C50919 Malignant neoplasm of unspecified site of unspecified female breast: Secondary | ICD-10-CM | POA: Diagnosis present

## 2023-07-25 DIAGNOSIS — C7951 Secondary malignant neoplasm of bone: Secondary | ICD-10-CM | POA: Insufficient documentation

## 2023-07-25 DIAGNOSIS — J90 Pleural effusion, not elsewhere classified: Secondary | ICD-10-CM

## 2023-07-25 DIAGNOSIS — J910 Malignant pleural effusion: Secondary | ICD-10-CM | POA: Insufficient documentation

## 2023-07-25 MED ORDER — LIDOCAINE HCL 1 % IJ SOLN
INTRAMUSCULAR | Status: AC
  Filled 2023-07-25: qty 20

## 2023-07-25 NOTE — Procedures (Addendum)
Ultrasound-guided diagnostic and therapeutic right thoracentesis performed yielding 1 liter of slightly hazy, yellow  fluid. No immediate complications. Follow-up chest x-ray pending. A portion of the fluid was sent to the lab for preordered studies. EBL< 2 cc.

## 2023-07-26 LAB — CYTOLOGY - NON PAP

## 2023-07-26 NOTE — Telephone Encounter (Signed)
Received notification via fax from [employer] that application has been referred to [employer] for final review and processing. I will continue to follow and update until final determination.   NPAF's phone number [phone]   Ardeen Fillers, CPhT Oncology Pharmacy Patient Advocate  [facility]  [phone] (phone) [phone] (fax) 07/26/2023 11:24 AM

## 2023-07-30 ENCOUNTER — Other Ambulatory Visit (HOSPITAL_COMMUNITY): Payer: Self-pay

## 2023-07-30 ENCOUNTER — Telehealth: Payer: Self-pay

## 2023-07-30 ENCOUNTER — Other Ambulatory Visit: Payer: Self-pay

## 2023-07-30 ENCOUNTER — Encounter: Payer: Self-pay | Admitting: Hematology & Oncology

## 2023-07-30 NOTE — Progress Notes (Signed)
Oral Chemotherapy Pharmacist Encounter  Patient was counseled under telephone encounter from 07/22/23.  Lenord Carbo, PharmD, BCPS, [facility] Hematology/Oncology Clinical Pharmacist [facility] Oral Chemotherapy Navigation Clinics [phone] 07/30/2023 12:57 PM

## 2023-07-30 NOTE — Telephone Encounter (Signed)
Called to check status of application. Informed by representative that patient needed to call them to verify that they were the only person in [facility] and to confirm SS was the only form of income for patient. I indicated to the representative that all of that was factual and they, once again, informed me that they had to hear that verbally from the patient. I called and spoke with patient's daughter, Ander Slade, who said that she would call [employer] with her mother at [phone] to provide this information. They will call me back at [phone] if they run into any issues attempting to do this. I will continue to follow and update until final determination.    Ardeen Fillers, CPhT Oncology Pharmacy Patient Advocate  [facility]  [phone] (phone) [phone] (fax) 07/30/2023 9:49 AM

## 2023-07-30 NOTE — Progress Notes (Signed)
Specialty Pharmacy Initial Fill Coordination Note  Karina White is a 67 y.o. female contacted today regarding refills of specialty medication(s) Ribociclib Succinate   Patient requested Delivery   Delivery date: 08/01/23   Verified address: [address], [place], [place] [address]   Medication will be filled on 07/31/23.   Patient is aware of $0.00 copayment.   Ardeen Fillers, CPhT Oncology Pharmacy Patient Advocate  [facility]  [phone] (phone) [phone] (fax) 07/30/2023 12:54 PM

## 2023-07-30 NOTE — Telephone Encounter (Signed)
Oral Chemotherapy Pharmacist Encounter  I spoke with patient for overview of: Kisqali (ribociclib) for the treatment of metastatic HR positive, HER-2 negative breast cancer in conjunction with letrozole, planned duration until disease progression or unacceptable drug toxicity.   Counseled patient on administration, dosing, side effects, monitoring, drug-food interactions, safe handling, storage, and disposal.  Patient will take Kisqali 200mg  tablets, 3 tablets (600mg ) by mouth once daily without regard to food.  Patient will take Kisqali for 3 weeks on, 1 week off, repeat every 4 weeks.  Patient knows to avoid grapefruit and grapefruit juice.  Kisqali start date: 08/01/23  Patient will need EKG on 08/14/23 (~C1D14) and then repeat EKG on 09/29/23 (~C2D1) per manufacturer recomendations.  Adverse effects include but are not limited to: nausea, vomiting, diarrhea, fatigue, rash, decreased blood counts, and altered cardiac conduction. Diarrhea: Patient will obtain Imodium (loperamide) to have on hand if they experience diarrhea. Patient knows to alert the office of 4 or more loose stools above baseline.  Reviewed with patient importance of keeping a medication schedule and plan for any missed doses. No barriers to medication adherence identified.  Medication reconciliation performed and medication/allergy list updated.  First fill of Karina White will be through free trial offer while patient pends approval for manufacturer assistance to obtain drug at no cost going forward.   All questions answered.  Karina White voiced understanding and appreciation.   Medication education handout placed in mail for patient. Patient knows to call the office with questions or concerns. Oral Chemotherapy Clinic phone number provided to patient.   Lenord Carbo, PharmD, BCPS, [facility] Hematology/Oncology Clinical Pharmacist [facility] and [facility] Oral Chemotherapy Navigation Clinics [phone] 07/30/2023  12:54 PM

## 2023-07-30 NOTE — Telephone Encounter (Addendum)
Oral Oncology Patient Advocate Encounter    **While waiting for PAP application to finish processing, obtained a 30 day voucher for [facility] so patient can get started. Patient has medication scheduled for delivery from [facility] to their home on Thursday, 08/01/23. Drug Education has been provided by pharmacist.**   Was successful in obtaining a voucher card for Kisqali.  This voucher card will make the patients copay $0.00 for the first fill.  I have spoken with the patient.    The billing information is as follows and has been shared with [facility].   RxBin: [id number] PCN: [id number] Member ID: [id number] Group ID: [id number]   Ardeen Fillers, CPhT Oncology Pharmacy Patient Advocate  [facility]  [phone] (phone) [phone] (fax) 07/30/2023 1:02 PM

## 2023-07-31 ENCOUNTER — Other Ambulatory Visit: Payer: Self-pay

## 2023-07-31 ENCOUNTER — Other Ambulatory Visit (HOSPITAL_COMMUNITY): Payer: Self-pay

## 2023-07-31 ENCOUNTER — Telehealth: Payer: Self-pay | Admitting: *Deleted

## 2023-07-31 MED ORDER — RIBOCICLIB SUCC (600 MG DOSE) 200 MG PO TBPK
600.0000 mg | ORAL_TABLET | Freq: Every day | ORAL | 0 refills | Status: DC
  Filled 2023-07-31: qty 63, 28d supply, fill #0

## 2023-07-31 NOTE — Addendum Note (Signed)
Addended byOtis Peak on: 07/31/2023 09:21 AM   Modules accepted: Orders

## 2023-07-31 NOTE — Telephone Encounter (Signed)
Oral Oncology Patient Advocate Encounter   Received notification that the application for assistance for Kisqali through [employer] has been approved.   NPAF's phone number [phone].   Effective dates: 07/30/23 through 07/29/24  Medication will be filled at [facility].   Ardeen Fillers, CPhT Oncology Pharmacy Patient Advocate  [facility]  [phone] (phone) [phone] (fax) 07/31/2023 10:28 AM

## 2023-07-31 NOTE — Telephone Encounter (Signed)
Daughter of Karina White's FMLA form completed, signed by provider and successfully returned via fax number provided on to [employer] via fax number caregiver provided on intake form [phone])  Copy to [facility] H.I.M[employer] bin for items to be scanned.  Copy mailed to patient address on file.  No further instructions received or actions performed by this nurse.

## 2023-08-05 ENCOUNTER — Other Ambulatory Visit: Payer: Self-pay

## 2023-08-05 ENCOUNTER — Other Ambulatory Visit (HOSPITAL_BASED_OUTPATIENT_CLINIC_OR_DEPARTMENT_OTHER): Payer: Self-pay

## 2023-08-05 ENCOUNTER — Encounter: Payer: Self-pay | Admitting: Hematology & Oncology

## 2023-08-05 MED ORDER — LETROZOLE 2.5 MG PO TABS
2.5000 mg | ORAL_TABLET | Freq: Every day | ORAL | 6 refills | Status: DC
  Filled 2023-08-05: qty 30, 30d supply, fill #0
  Filled 2023-08-29: qty 30, 30d supply, fill #1
  Filled 2023-09-27: qty 30, 30d supply, fill #2
  Filled 2023-10-31: qty 30, 30d supply, fill #3
  Filled 2023-11-29: qty 90, 90d supply, fill #4
  Filled 2023-11-29 (×2): qty 30, 30d supply, fill #4
  Filled 2024-01-01: qty 30, 30d supply, fill #5
  Filled 2024-01-31: qty 30, 30d supply, fill #6

## 2023-08-06 ENCOUNTER — Other Ambulatory Visit (HOSPITAL_BASED_OUTPATIENT_CLINIC_OR_DEPARTMENT_OTHER): Payer: Self-pay

## 2023-08-07 ENCOUNTER — Other Ambulatory Visit: Payer: Self-pay

## 2023-08-07 NOTE — Progress Notes (Signed)
Patient approved for Patient Assistance Program through drug manufacturer. Disenrolled.    Ardeen Fillers, CPhT Oncology Pharmacy Patient Advocate  [facility]  [phone] (phone) [phone] (fax) 08/07/2023 1:24 PM

## 2023-08-14 ENCOUNTER — Other Ambulatory Visit (HOSPITAL_BASED_OUTPATIENT_CLINIC_OR_DEPARTMENT_OTHER): Payer: Self-pay

## 2023-08-14 ENCOUNTER — Encounter: Payer: Self-pay | Admitting: Hematology & Oncology

## 2023-08-14 ENCOUNTER — Inpatient Hospital Stay: Payer: Medicare Other | Attending: Hematology & Oncology

## 2023-08-14 ENCOUNTER — Ambulatory Visit (HOSPITAL_BASED_OUTPATIENT_CLINIC_OR_DEPARTMENT_OTHER)
Admission: RE | Admit: 2023-08-14 | Discharge: 2023-08-14 | Disposition: A | Discharge status: Home / Self Care | Payer: Medicare Other | Source: Ambulatory Visit | Attending: Hematology & Oncology | Admitting: Hematology & Oncology

## 2023-08-14 ENCOUNTER — Inpatient Hospital Stay (HOSPITAL_BASED_OUTPATIENT_CLINIC_OR_DEPARTMENT_OTHER): Payer: Medicare Other | Admitting: Hematology & Oncology

## 2023-08-14 ENCOUNTER — Ambulatory Visit: Payer: Medicare Other

## 2023-08-14 ENCOUNTER — Encounter: Payer: Self-pay | Admitting: *Deleted

## 2023-08-14 VITALS — BP 155/81 | HR 75 | Temp 98.2°F | Resp 20 | Ht 67.0 in | Wt 169.1 lb

## 2023-08-14 DIAGNOSIS — Z1731 Human epidermal growth factor receptor 2 positive status: Secondary | ICD-10-CM | POA: Insufficient documentation

## 2023-08-14 DIAGNOSIS — C782 Secondary malignant neoplasm of pleura: Secondary | ICD-10-CM | POA: Diagnosis not present

## 2023-08-14 DIAGNOSIS — K6389 Other specified diseases of intestine: Secondary | ICD-10-CM | POA: Diagnosis not present

## 2023-08-14 DIAGNOSIS — C7951 Secondary malignant neoplasm of bone: Secondary | ICD-10-CM | POA: Insufficient documentation

## 2023-08-14 DIAGNOSIS — C50919 Malignant neoplasm of unspecified site of unspecified female breast: Secondary | ICD-10-CM | POA: Insufficient documentation

## 2023-08-14 DIAGNOSIS — Z170 Estrogen receptor positive status [ER+]: Secondary | ICD-10-CM | POA: Diagnosis not present

## 2023-08-14 DIAGNOSIS — Z1721 Progesterone receptor positive status: Secondary | ICD-10-CM | POA: Insufficient documentation

## 2023-08-14 DIAGNOSIS — Z79811 Long term (current) use of aromatase inhibitors: Secondary | ICD-10-CM | POA: Diagnosis not present

## 2023-08-14 LAB — CBC WITH DIFFERENTIAL (CANCER CENTER ONLY)
Abs Immature Granulocytes: 0.03 10*3/uL (ref 0.00–0.07)
Basophils Absolute: 0 10*3/uL (ref 0.0–0.1)
Basophils Relative: 1 %
Eosinophils Absolute: 0.1 10*3/uL (ref 0.0–0.5)
Eosinophils Relative: 2 %
HCT: 33.6 % — ABNORMAL LOW (ref 36.0–46.0)
Hemoglobin: 11 g/dL — ABNORMAL LOW (ref 12.0–15.0)
Immature Granulocytes: 1 %
Lymphocytes Relative: 42 %
Lymphs Abs: 1.5 10*3/uL (ref 0.7–4.0)
MCH: 25.9 pg — ABNORMAL LOW (ref 26.0–34.0)
MCHC: 32.7 g/dL (ref 30.0–36.0)
MCV: 79.1 fL — ABNORMAL LOW (ref 80.0–100.0)
Monocytes Absolute: 0.2 10*3/uL (ref 0.1–1.0)
Monocytes Relative: 4 %
Neutro Abs: 1.8 10*3/uL (ref 1.7–7.7)
Neutrophils Relative %: 50 %
Platelet Count: 337 10*3/uL (ref 150–400)
RBC: 4.25 MIL/uL (ref 3.87–5.11)
RDW: 21.8 % — ABNORMAL HIGH (ref 11.5–15.5)
Smear Review: NORMAL
WBC Count: 3.6 10*3/uL — ABNORMAL LOW (ref 4.0–10.5)
nRBC: 0 % (ref 0.0–0.2)

## 2023-08-14 LAB — CMP (CANCER CENTER ONLY)
ALT: 10 U/L (ref 0–44)
AST: 18 U/L (ref 15–41)
Albumin: 3.7 g/dL (ref 3.5–5.0)
Alkaline Phosphatase: 127 U/L — ABNORMAL HIGH (ref 38–126)
Anion gap: 8 (ref 5–15)
BUN: 8 mg/dL (ref 8–23)
CO2: 28 mmol/L (ref 22–32)
Calcium: 8.6 mg/dL — ABNORMAL LOW (ref 8.9–10.3)
Chloride: 108 mmol/L (ref 98–111)
Creatinine: 1.08 mg/dL — ABNORMAL HIGH (ref 0.44–1.00)
GFR, Estimated: 56 mL/min — ABNORMAL LOW (ref >60)
Glucose, Bld: 116 mg/dL — ABNORMAL HIGH (ref 70–99)
Potassium: 4 mmol/L (ref 3.5–5.1)
Sodium: 144 mmol/L (ref 135–145)
Total Bilirubin: 0.6 mg/dL (ref <1.2)
Total Protein: 7.1 g/dL (ref 6.5–8.1)

## 2023-08-14 LAB — IRON AND IRON BINDING CAPACITY (CC-WL,HP ONLY)
Iron: 89 ug/dL (ref 28–170)
Saturation Ratios: 31 % (ref 10.4–31.8)
TIBC: 284 ug/dL (ref 250–450)
UIBC: 195 ug/dL (ref 148–442)

## 2023-08-14 LAB — FERRITIN: Ferritin: 360 ng/mL — ABNORMAL HIGH (ref 11–307)

## 2023-08-14 NOTE — Progress Notes (Signed)
Hematology and Oncology Follow Up Visit  Karina White [id number] 02-07-1956 67 y.o. 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  We now have her on treatment with Femara and get Scally.  She is doing okay with this.  Her last thoracentesis was on 07/25/2023.  A liter of fluid was taken off from the right lung.  She says she feels better.  She still has some dyspnea.  We probably need to perform another chest x-ray.  Her appetite is doing pretty well.  She is having no problems with nausea or vomiting.  Her skin is a little bit on the dry side.  I told her to try some aloe vera lotion.  She has had no issues with bowels or bladder.  There is no bleeding.  She has had no leg swelling.  There is been no rashes.  She has had no headache.  Overall, I would have to say that her performance status is probably ECOG 1.    Medications:  Current Outpatient Medications:    acetaminophen (TYLENOL) 325 MG tablet, Take 2 tablets (650 mg total) by mouth every 6 (six) hours as needed for mild pain, fever or headache., Disp: 20 tablet, Rfl: 0   amLODipine (NORVASC) 10 MG tablet, Take 1 tablet (10 mg total) by mouth daily., Disp: 30 tablet, Rfl: 0   aspirin EC 81 MG tablet, Take 81 mg by mouth daily., Disp: , Rfl:    carvedilol (COREG CR) 10 MG 24 hr capsule, Take 1 capsule (10 mg total) by mouth daily., Disp: 30 capsule, Rfl: 4   Cholecalciferol 25 MCG (1000 UT) capsule, Take 2 capsules (2,000 Units total) by mouth daily., Disp: 60 capsule, Rfl: 0   Garlic 1000 MG CAPS, Take 1,000 mg by mouth daily., Disp: , Rfl:    letrozole (FEMARA) 2.5 MG tablet, Take 1 tablet (2.5 mg total) by mouth  daily., Disp: 30 tablet, Rfl: 6   Multiple Vitamins-Minerals (CENTRUM ADULTS PO), Take 1 tablet by mouth., Disp: , Rfl:    Omega 3 1200 MG CAPS, Take 1,200 mg by mouth daily., Disp: , Rfl:    ribociclib succ (KISQALI, 600 MG DOSE,) 200 MG Therapy Pack, Take 3 tablets (600 mg total) by mouth daily. Take for 21 days on, 7 days off, repeat every 28 days., Disp: 63 tablet, Rfl: 0  Allergies:  Allergies  Allergen Reactions   Penicillins Hives and Itching   Codeine Nausea And Vomiting    Past Medical History, Surgical history, Social history, and Family History were reviewed and updated.  Review of Systems: Review of Systems  Constitutional: Negative.  Negative for appetite change and unexpected weight change.  HENT:  Negative.    Eyes: Negative.   Respiratory:  Positive for shortness of breath.   Gastrointestinal: Negative.   Endocrine: Negative.   Genitourinary: Negative.    Musculoskeletal:  Positive for back pain.  Skin: Negative.   Neurological: Negative.   Hematological: Negative.   Psychiatric/Behavioral: Negative.      Physical Exam:  height is 5\' 7"  (1.702 m) and weight is 169 lb 1.9 oz (76.7 kg). Her oral temperature is 98.2 F (36.8 C).  Her blood pressure is 155/81 (abnormal) and her pulse is 75. Her respiration is 20 and oxygen saturation is 99%.   Wt Readings from Last 3 Encounters:  08/14/23 169 lb 1.9 oz (76.7 kg)  07/22/23 170 lb (77.1 kg)  06/25/23 172 lb (78 kg)    Physical Exam Vitals reviewed.  Constitutional:      Comments: Her breast exam shows right breast with no masses, edema or erythema.  There is no right axillary adenopathy.  Left breast does show some irregularity and some firmness at about the 12 o'clock position.  There is no tenderness.  There is no nipple discharge.  There is no left axillary adenopathy.  HENT:     Head: Normocephalic and atraumatic.  Eyes:     Pupils: Pupils are equal, round, and reactive to light.  Cardiovascular:     Rate  and Rhythm: Normal rate and regular rhythm.     Heart sounds: Normal heart sounds.  Pulmonary:     Effort: Pulmonary effort is normal.     Breath sounds: Normal breath sounds.  Abdominal:     General: Bowel sounds are normal.     Palpations: Abdomen is soft.     Comments: Abdominal exam is soft.  She has decent bowel sounds.  There is no fluid wave.  There is no palpable liver or spleen tip.  Musculoskeletal:        General: No tenderness or deformity. Normal range of motion.     Cervical back: Normal range of motion.     Comments: Back exam shows some slight tenderness to palpation over the thoracic and lumbar spine.  Lymphadenopathy:     Cervical: No cervical adenopathy.  Skin:    General: Skin is warm and dry.     Findings: No erythema or rash.  Neurological:     Mental Status: She is alert and oriented to person, place, and time.  Psychiatric:        Behavior: Behavior normal.        Thought Content: Thought content normal.        Judgment: Judgment normal.      Lab Results  Component Value Date   WBC 3.6 (L) 08/14/2023   HGB 11.0 (L) 08/14/2023   HCT 33.6 (L) 08/14/2023   MCV 79.1 (L) 08/14/2023   PLT 337 08/14/2023     Chemistry      Component Value Date/Time   NA 144 08/14/2023 1249   K 4.0 08/14/2023 1249   CL 108 08/14/2023 1249   CO2 28 08/14/2023 1249   BUN 8 08/14/2023 1249   CREATININE 1.08 (H) 08/14/2023 1249      Component Value Date/Time   CALCIUM 8.6 (L) 08/14/2023 1249   ALKPHOS 127 (H) 08/14/2023 1249   AST 18 08/14/2023 1249   ALT 10 08/14/2023 1249   BILITOT 0.6 08/14/2023 1249      Impression and Plan: Karina White is a very nice 67 year old Afro-American female.  She has stage IV adenocarcinoma of the breast.  Thankfully, her tumor is ER/PR positive.  It is low HER2 positive.  We will get her going on antiestrogen therapy.  I think she should be able to tolerate this.  We also get her on bisphosphonate therapy.  I will hopefully be  able to get Rivka Barbara approved for her so we can start using this.  I noted that the alkaline phosphatase is down quite a bit.  Hopefully this is a indicator that she is responding  to treatment.  We will see what her CA 27.29 looks like.  Will get another chest x-ray on her.  Hopefully, she will not need to have a thoracentesis.  Again, had to believe that she is responding to treatment.  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  As such, we could certainly utilize this as targeted therapy if she progresses on frontline therapy.  We will plan to get her back to see [place] in another month.  Again, hopefully will see that she is responding.    Josph Macho, MD 12/4/20241:55 PM

## 2023-08-14 NOTE — Addendum Note (Signed)
Addended by: Arlan Organ R on: 08/14/2023 03:59 PM   Modules accepted: Orders

## 2023-08-14 NOTE — Telephone Encounter (Signed)
Called and spoke to patient regarding approval to [employer]PA[employer]. Patient knows to call [employer]PA[employer] at [phone] and select option 2 to place first order. Patient also knows to call me at [phone] with any questions or concerns regarding receiving medication from PAP Program.    Ardeen Fillers, CPhT Oncology Pharmacy Patient Advocate  [facility]  [phone] (phone) [phone] (fax) 08/14/2023 8:56 AM

## 2023-08-14 NOTE — Progress Notes (Signed)
BP remains elevated, 155/81. Instructed to monitor at home and notify MD if it remains over 140/90. Verbalized understanding.

## 2023-08-14 NOTE — Progress Notes (Unsigned)
Patient has had good tolerance of Kiquali and Femara. She will continue on this treatment.   Oncology Nurse Navigator Documentation     08/14/2023    1:15 PM  Oncology Nurse Navigator Flowsheets  Navigator Follow Up Date: 09/12/2023  Navigator Follow Up Reason: Follow-up Appointment  Navigator Location [facility]  Navigator Encounter Type Appt/Treatment Plan Review  Patient Visit Type MedOnc  Treatment Phase Active Tx  Barriers/Navigation Needs Coordination of Care;Education  Interventions None Required  Acuity Level 2-Minimal Needs (1-2 Barriers Identified)  Support Groups/Services Friends and Family  Time Spent with Patient 15

## 2023-08-15 ENCOUNTER — Telehealth: Payer: Self-pay

## 2023-08-15 ENCOUNTER — Encounter: Payer: Self-pay | Admitting: Hematology & Oncology

## 2023-08-15 LAB — CANCER ANTIGEN 27.29: CA 27.29: 198.9 U/mL — ABNORMAL HIGH (ref 0.0–38.6)

## 2023-08-15 NOTE — Telephone Encounter (Signed)
Per Dr. [name] pt called and notified that the fluid is back on her right lung.  Pt notified that Dr. [name] wants her to get a thoracentesis. Pt states that she has been SOB but not more than normal. Pt given appointment details of being at [facility] main entrance to check in at 10:00 am on 08/16/2023. Pt verbalized understanding and had no further questions.

## 2023-08-15 NOTE — Telephone Encounter (Signed)
-----   Message from Josph Macho sent at 08/14/2023  3:58 PM EST ----- Please call and let her know that the fluid on the right lung is back.  We probably need to have this drawn off again.  Tell her that we will try to get this set up for tomorrow or Friday.  Thanks.  Cindee Lame

## 2023-08-16 ENCOUNTER — Ambulatory Visit (HOSPITAL_COMMUNITY)
Admission: RE | Admit: 2023-08-16 | Discharge: 2023-08-16 | Disposition: A | Discharge status: Home / Self Care | Payer: Medicare Other | Source: Ambulatory Visit | Attending: Hematology & Oncology | Admitting: Hematology & Oncology

## 2023-08-16 ENCOUNTER — Ambulatory Visit (HOSPITAL_COMMUNITY)
Admission: RE | Admit: 2023-08-16 | Discharge: 2023-08-16 | Disposition: A | Discharge status: Home / Self Care | Payer: Medicare Other | Source: Ambulatory Visit | Attending: Radiology | Admitting: Radiology

## 2023-08-16 VITALS — BP 153/89

## 2023-08-16 DIAGNOSIS — Z853 Personal history of malignant neoplasm of breast: Secondary | ICD-10-CM | POA: Diagnosis not present

## 2023-08-16 DIAGNOSIS — C7951 Secondary malignant neoplasm of bone: Secondary | ICD-10-CM | POA: Diagnosis not present

## 2023-08-16 DIAGNOSIS — C50919 Malignant neoplasm of unspecified site of unspecified female breast: Secondary | ICD-10-CM | POA: Diagnosis present

## 2023-08-16 DIAGNOSIS — J90 Pleural effusion, not elsewhere classified: Secondary | ICD-10-CM | POA: Insufficient documentation

## 2023-08-16 MED ORDER — LIDOCAINE HCL 1 % IJ SOLN
INTRAMUSCULAR | Status: AC
  Filled 2023-08-16: qty 20

## 2023-08-16 NOTE — Procedures (Signed)
Ultrasound-guided  therapeutic right thoracentesis performed yielding 1 liter of yellow fluid. No immediate complications. Follow-up chest x-ray pending. EBL < 2 cc. Due to persistent pt coughing/chest discomfort only the above amount of fluid was removed today.

## 2023-08-29 ENCOUNTER — Other Ambulatory Visit (HOSPITAL_BASED_OUTPATIENT_CLINIC_OR_DEPARTMENT_OTHER): Payer: Self-pay

## 2023-08-29 ENCOUNTER — Encounter: Payer: Self-pay | Admitting: Hematology & Oncology

## 2023-09-03 ENCOUNTER — Telehealth: Payer: Self-pay

## 2023-09-03 NOTE — Telephone Encounter (Signed)
Received phone call from patient questioning if she should be on a diuretic blood pressure as her feet swell occasionally. Pt  educated that she should follow up with her PCP for that question. Pt also educated that on days her feet swell to elevate her legs and monitor to her diet to not include processed foods or salty foods. Pt educated on her next appointment date and time. Pt verbalized understanding and had no further questions.

## 2023-09-05 ENCOUNTER — Other Ambulatory Visit: Payer: Self-pay

## 2023-09-05 ENCOUNTER — Encounter: Payer: Self-pay | Admitting: Hematology & Oncology

## 2023-09-05 ENCOUNTER — Other Ambulatory Visit (HOSPITAL_BASED_OUTPATIENT_CLINIC_OR_DEPARTMENT_OTHER): Payer: Self-pay

## 2023-09-05 ENCOUNTER — Other Ambulatory Visit: Payer: Self-pay | Admitting: *Deleted

## 2023-09-05 MED ORDER — CARVEDILOL 3.125 MG PO TABS
3.1250 mg | ORAL_TABLET | Freq: Two times a day (BID) | ORAL | 0 refills | Status: DC
  Filled 2023-09-05: qty 60, 30d supply, fill #0

## 2023-09-09 ENCOUNTER — Telehealth: Payer: Self-pay | Admitting: *Deleted

## 2023-09-09 ENCOUNTER — Encounter: Payer: Self-pay | Admitting: Hematology & Oncology

## 2023-09-09 ENCOUNTER — Inpatient Hospital Stay: Payer: Medicare Other

## 2023-09-09 ENCOUNTER — Other Ambulatory Visit (HOSPITAL_BASED_OUTPATIENT_CLINIC_OR_DEPARTMENT_OTHER): Payer: Self-pay

## 2023-09-09 ENCOUNTER — Encounter: Payer: Self-pay | Admitting: *Deleted

## 2023-09-09 ENCOUNTER — Other Ambulatory Visit: Payer: Self-pay | Admitting: Hematology & Oncology

## 2023-09-09 ENCOUNTER — Ambulatory Visit (HOSPITAL_BASED_OUTPATIENT_CLINIC_OR_DEPARTMENT_OTHER)
Admission: RE | Admit: 2023-09-09 | Discharge: 2023-09-09 | Disposition: A | Discharge status: Home / Self Care | Payer: Medicare Other | Source: Ambulatory Visit | Attending: Hematology & Oncology | Admitting: Hematology & Oncology

## 2023-09-09 ENCOUNTER — Inpatient Hospital Stay (HOSPITAL_BASED_OUTPATIENT_CLINIC_OR_DEPARTMENT_OTHER): Payer: Medicare Other | Admitting: Hematology & Oncology

## 2023-09-09 ENCOUNTER — Other Ambulatory Visit: Payer: Self-pay

## 2023-09-09 VITALS — BP 176/87 | HR 65 | Temp 98.1°F | Resp 18 | Ht 67.0 in | Wt 169.0 lb

## 2023-09-09 DIAGNOSIS — R9431 Abnormal electrocardiogram [ECG] [EKG]: Secondary | ICD-10-CM

## 2023-09-09 DIAGNOSIS — C7951 Secondary malignant neoplasm of bone: Secondary | ICD-10-CM

## 2023-09-09 DIAGNOSIS — K6389 Other specified diseases of intestine: Secondary | ICD-10-CM

## 2023-09-09 DIAGNOSIS — C50919 Malignant neoplasm of unspecified site of unspecified female breast: Secondary | ICD-10-CM

## 2023-09-09 LAB — CBC WITH DIFFERENTIAL (CANCER CENTER ONLY)
Abs Immature Granulocytes: 0.01 10*3/uL (ref 0.00–0.07)
Basophils Absolute: 0.1 10*3/uL (ref 0.0–0.1)
Basophils Relative: 2 %
Eosinophils Absolute: 0 10*3/uL (ref 0.0–0.5)
Eosinophils Relative: 1 %
HCT: 33.4 % — ABNORMAL LOW (ref 36.0–46.0)
Hemoglobin: 11 g/dL — ABNORMAL LOW (ref 12.0–15.0)
Immature Granulocytes: 0 %
Lymphocytes Relative: 41 %
Lymphs Abs: 1.3 10*3/uL (ref 0.7–4.0)
MCH: 27.4 pg (ref 26.0–34.0)
MCHC: 32.9 g/dL (ref 30.0–36.0)
MCV: 83.1 fL (ref 80.0–100.0)
Monocytes Absolute: 0.2 10*3/uL (ref 0.1–1.0)
Monocytes Relative: 7 %
Neutro Abs: 1.6 10*3/uL — ABNORMAL LOW (ref 1.7–7.7)
Neutrophils Relative %: 49 %
Platelet Count: 476 10*3/uL — ABNORMAL HIGH (ref 150–400)
RBC: 4.02 MIL/uL (ref 3.87–5.11)
RDW: 23.7 % — ABNORMAL HIGH (ref 11.5–15.5)
Smear Review: NORMAL
WBC Count: 3.2 10*3/uL — ABNORMAL LOW (ref 4.0–10.5)
nRBC: 0 % (ref 0.0–0.2)

## 2023-09-09 LAB — CMP (CANCER CENTER ONLY)
ALT: 12 U/L (ref 0–44)
AST: 16 U/L (ref 15–41)
Albumin: 3.9 g/dL (ref 3.5–5.0)
Alkaline Phosphatase: 129 U/L — ABNORMAL HIGH (ref 38–126)
Anion gap: 8 (ref 5–15)
BUN: 9 mg/dL (ref 8–23)
CO2: 27 mmol/L (ref 22–32)
Calcium: 8.6 mg/dL — ABNORMAL LOW (ref 8.9–10.3)
Chloride: 107 mmol/L (ref 98–111)
Creatinine: 1.06 mg/dL — ABNORMAL HIGH (ref 0.44–1.00)
GFR, Estimated: 57 mL/min — ABNORMAL LOW (ref >60)
Glucose, Bld: 126 mg/dL — ABNORMAL HIGH (ref 70–99)
Potassium: 4.1 mmol/L (ref 3.5–5.1)
Sodium: 142 mmol/L (ref 135–145)
Total Bilirubin: 0.5 mg/dL (ref 0.0–1.2)
Total Protein: 6.7 g/dL (ref 6.5–8.1)

## 2023-09-09 LAB — IRON AND IRON BINDING CAPACITY (CC-WL,HP ONLY)
Iron: 95 ug/dL (ref 28–170)
Saturation Ratios: 32 % — ABNORMAL HIGH (ref 10.4–31.8)
TIBC: 294 ug/dL (ref 250–450)
UIBC: 199 ug/dL (ref 148–442)

## 2023-09-09 LAB — LACTATE DEHYDROGENASE: LDH: 223 U/L — ABNORMAL HIGH (ref 98–192)

## 2023-09-09 LAB — FERRITIN: Ferritin: 254 ng/mL (ref 11–307)

## 2023-09-09 MED ORDER — MOXIFLOXACIN HCL 400 MG PO TABS
400.0000 mg | ORAL_TABLET | Freq: Every day | ORAL | 0 refills | Status: DC
  Filled 2023-09-09: qty 7, 7d supply, fill #0

## 2023-09-09 MED ORDER — SODIUM CHLORIDE 0.9 % IV SOLN: INTRAVENOUS | Status: DC

## 2023-09-09 MED ORDER — DEXTROSE 5 % IV SOLN
2.0000 g | Freq: Once | INTRAVENOUS | Status: AC
  Administered 2023-09-09: 2 g via INTRAVENOUS
  Filled 2023-09-09: qty 20

## 2023-09-09 MED ORDER — METHYLPREDNISOLONE SODIUM SUCC 125 MG IJ SOLR
60.0000 mg | Freq: Once | INTRAMUSCULAR | Status: AC
  Administered 2023-09-09: 60 mg via INTRAVENOUS
  Filled 2023-09-09: qty 2

## 2023-09-09 NOTE — Telephone Encounter (Signed)
Call received from patient wanting to know if she can see Dr. [name] today d/t she is having a slight increase in SOB.  Dr. [name] notified.  Patient notified per order of Dr. [name] to come in today for a CXR, labs and to be seen.  Pt states that she can be here by 11:00AM today.  Message sent to scheduling.

## 2023-09-09 NOTE — Progress Notes (Signed)
Hematology and Oncology Follow Up Visit  Leketa White [id number] 18-Nov-1955 67 y.o. 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  She comes in early.  She is having some dyspnea.  This seemed to start a few days ago.  We did do a chest x-ray on her.  Looks that she has a little bit more of fluid overall in the right lung.  It is hard to say how much that she really has.  There is also a little atelectasis.  She has had a cough.  It is nonproductive.  There is no hemoptysis.  She is on the [place].  Her last CA 27.19 was actually bit more elevated at 200.  She has had a decent appetite.  There is no nausea or vomiting.  There is no diarrhea.  She has had no rashes.  There has been no fever.  She has had no headache.  Overall, I would say that her performance status is probably ECOG 1.   Medications:  Current Outpatient Medications:    acetaminophen (TYLENOL) 325 MG tablet, Take 2 tablets (650 mg total) by mouth every 6 (six) hours as needed for mild pain, fever or headache., Disp: 20 tablet, Rfl: 0   amLODipine (NORVASC) 10 MG tablet, Take 1 tablet (10 mg total) by mouth daily., Disp: 30 tablet, Rfl: 0   aspirin EC 81 MG tablet, Take 81 mg by mouth daily., Disp: , Rfl:    carvedilol (COREG) 3.125 MG tablet, Take 1 tablet (3.125 mg total) by mouth 2 (two) times daily with a meal., Disp: 60 tablet, Rfl: 0   Cholecalciferol 25 MCG (1000 UT) capsule, Take 2 capsules (2,000 Units total) by mouth daily., Disp: 60 capsule, Rfl: 0   Garlic 1000 MG CAPS, Take 1,000 mg by mouth daily., Disp: , Rfl:    letrozole (FEMARA) 2.5 MG tablet, Take 1 tablet (2.5 mg total) by mouth  daily., Disp: 30 tablet, Rfl: 6   Multiple Vitamins-Minerals (CENTRUM ADULTS PO), Take 1 tablet by mouth., Disp: , Rfl:    Omega 3 1200 MG CAPS, Take 1,200 mg by mouth daily., Disp: , Rfl:    ribociclib succ (KISQALI, 600 MG DOSE,) 200 MG Therapy Pack, Take 3 tablets (600 mg total) by mouth daily. Take for 21 days on, 7 days off, repeat every 28 days., Disp: 63 tablet, Rfl: 0  Allergies:  Allergies  Allergen Reactions   Penicillins Hives and Itching   Codeine Nausea And Vomiting and Other (See Comments)   Penicillin G Other (See Comments)    Past Medical History, Surgical history, Social history, and Family History were reviewed and updated.  Review of Systems: Review of Systems  Constitutional: Negative.  Negative for appetite change and unexpected weight change.  HENT:  Negative.    Eyes: Negative.   Respiratory:  Positive for shortness of breath.   Gastrointestinal: Negative.   Endocrine: Negative.   Genitourinary: Negative.    Musculoskeletal:  Positive for back pain.  Skin: Negative.   Neurological: Negative.   Hematological: Negative.   Psychiatric/Behavioral: Negative.      Physical Exam:  height is 5\' 7"  (1.702 m) and  weight is 169 lb (76.7 kg). Her oral temperature is 98.1 F (36.7 C). Her blood pressure is 176/87 (abnormal) and her pulse is 65. Her respiration is 18 and oxygen saturation is 95%.   Wt Readings from Last 3 Encounters:  09/09/23 169 lb (76.7 kg)  08/14/23 169 lb 1.9 oz (76.7 kg)  07/22/23 170 lb (77.1 kg)    Physical Exam Vitals reviewed.  Constitutional:      Comments: Her breast exam shows right breast with no masses, edema or erythema.  There is no right axillary adenopathy.  Left breast does show some irregularity and some firmness at about the 12 o'clock position.  There is no tenderness.  There is no nipple discharge.  There is no left axillary adenopathy.  HENT:     Head: Normocephalic and atraumatic.  Eyes:     Pupils: Pupils are equal,  round, and reactive to light.  Cardiovascular:     Rate and Rhythm: Normal rate and regular rhythm.     Heart sounds: Normal heart sounds.  Pulmonary:     Effort: Pulmonary effort is normal.     Breath sounds: Normal breath sounds.  Abdominal:     General: Bowel sounds are normal.     Palpations: Abdomen is soft.     Comments: Abdominal exam is soft.  She has decent bowel sounds.  There is no fluid wave.  There is no palpable liver or spleen tip.  Musculoskeletal:        General: No tenderness or deformity. Normal range of motion.     Cervical back: Normal range of motion.     Comments: Back exam shows some slight tenderness to palpation over the thoracic and lumbar spine.  Lymphadenopathy:     Cervical: No cervical adenopathy.  Skin:    General: Skin is warm and dry.     Findings: No erythema or rash.  Neurological:     Mental Status: She is alert and oriented to person, place, and time.  Psychiatric:        Behavior: Behavior normal.        Thought Content: Thought content normal.        Judgment: Judgment normal.     Lab Results  Component Value Date   WBC 3.2 (L) 09/09/2023   HGB 11.0 (L) 09/09/2023   HCT 33.4 (L) 09/09/2023   MCV 83.1 09/09/2023   PLT 476 (H) 09/09/2023     Chemistry      Component Value Date/Time   NA 142 09/09/2023 1110   K 4.1 09/09/2023 1110   CL 107 09/09/2023 1110   CO2 27 09/09/2023 1110   BUN 9 09/09/2023 1110   CREATININE 1.06 (H) 09/09/2023 1110      Component Value Date/Time   CALCIUM 8.6 (L) 09/09/2023 1110   ALKPHOS 129 (H) 09/09/2023 1110   AST 16 09/09/2023 1110   ALT 12 09/09/2023 1110   BILITOT 0.5 09/09/2023 1110      Impression and Plan: Karina White is a very nice 67 year old Afro-American female.  She has stage IV adenocarcinoma of the breast.  Thankfully, her tumor is ER/PR positive.  It is low HER2 positive.  We currently have her on antiestrogen therapy.  I know that it can sometimes take a while before  antiestrogen therapy can kick in.  I think we probably would not have to do another thoracentesis on her.  Again I am not sure how much fluid is in that right side.Marland Kitchen  I  do think it would not be a bad idea to give her some IV steroid.  Hopefully, this may take care of some inflammation.  I think it may also not be a bad idea to give her some antibiotic.  I gave her a dose of Rocephin in the clinic and then we will give her some Avelox to take at home.  I also think that it would not be a bad idea to get an echocardiogram on her to make sure that her cardiac status is okay.  I cannot imagine that there would be a problem there.  An incentive spirometer might also help her.  I am unsure how we can get that to her.  We will write a prescription and maybe she can get this at a medical supply store.     Josph Macho, MD 12/30/202412:32 PM

## 2023-09-10 ENCOUNTER — Other Ambulatory Visit (HOSPITAL_BASED_OUTPATIENT_CLINIC_OR_DEPARTMENT_OTHER): Payer: Self-pay

## 2023-09-10 ENCOUNTER — Encounter: Payer: Self-pay | Admitting: Hematology & Oncology

## 2023-09-10 ENCOUNTER — Other Ambulatory Visit (HOSPITAL_COMMUNITY): Payer: Self-pay | Admitting: Interventional Radiology

## 2023-09-10 ENCOUNTER — Other Ambulatory Visit: Payer: Self-pay | Admitting: Hematology & Oncology

## 2023-09-10 ENCOUNTER — Ambulatory Visit (HOSPITAL_COMMUNITY)
Admission: RE | Admit: 2023-09-10 | Discharge: 2023-09-10 | Disposition: A | Discharge status: Home / Self Care | Payer: Medicare Other | Source: Ambulatory Visit | Attending: Hematology & Oncology | Admitting: Hematology & Oncology

## 2023-09-10 DIAGNOSIS — C50919 Malignant neoplasm of unspecified site of unspecified female breast: Secondary | ICD-10-CM | POA: Diagnosis present

## 2023-09-10 DIAGNOSIS — J90 Pleural effusion, not elsewhere classified: Secondary | ICD-10-CM | POA: Insufficient documentation

## 2023-09-10 DIAGNOSIS — C7951 Secondary malignant neoplasm of bone: Secondary | ICD-10-CM | POA: Insufficient documentation

## 2023-09-10 DIAGNOSIS — R9431 Abnormal electrocardiogram [ECG] [EKG]: Secondary | ICD-10-CM

## 2023-09-10 HISTORY — PX: IR THORACENTESIS ASP PLEURAL SPACE W/IMG GUIDE: IMG5380

## 2023-09-10 LAB — CANCER ANTIGEN 27.29: CA 27.29: 217.1 U/mL — ABNORMAL HIGH (ref 0.0–38.6)

## 2023-09-10 MED ORDER — LIDOCAINE HCL 1 % IJ SOLN
INTRAMUSCULAR | Status: AC
  Filled 2023-09-10: qty 20

## 2023-09-12 ENCOUNTER — Ambulatory Visit: Payer: Medicare Other | Admitting: Hematology & Oncology

## 2023-09-12 ENCOUNTER — Telehealth: Payer: Self-pay

## 2023-09-12 ENCOUNTER — Inpatient Hospital Stay: Payer: Medicare (Managed Care)

## 2023-09-12 ENCOUNTER — Ambulatory Visit (HOSPITAL_BASED_OUTPATIENT_CLINIC_OR_DEPARTMENT_OTHER)
Admission: RE | Admit: 2023-09-12 | Discharge: 2023-09-12 | Disposition: A | Discharge status: Home / Self Care | Payer: Medicare (Managed Care) | Source: Ambulatory Visit | Attending: Hematology & Oncology | Admitting: Hematology & Oncology

## 2023-09-12 DIAGNOSIS — R0600 Dyspnea, unspecified: Secondary | ICD-10-CM | POA: Insufficient documentation

## 2023-09-12 DIAGNOSIS — C7951 Secondary malignant neoplasm of bone: Secondary | ICD-10-CM | POA: Diagnosis present

## 2023-09-12 DIAGNOSIS — R9431 Abnormal electrocardiogram [ECG] [EKG]: Secondary | ICD-10-CM | POA: Insufficient documentation

## 2023-09-12 DIAGNOSIS — C50919 Malignant neoplasm of unspecified site of unspecified female breast: Secondary | ICD-10-CM | POA: Insufficient documentation

## 2023-09-12 DIAGNOSIS — I119 Hypertensive heart disease without heart failure: Secondary | ICD-10-CM | POA: Insufficient documentation

## 2023-09-12 LAB — ECHOCARDIOGRAM COMPLETE
AR max vel: 2.24 cm2
AV Area VTI: 2.27 cm2
AV Area mean vel: 2.17 cm2
AV Mean grad: 5 mm[Hg]
AV Peak grad: 8.3 mm[Hg]
Ao pk vel: 1.44 m/s
Area-P 1/2: 2.33 cm2
Calc EF: 59.7 %
S' Lateral: 2.1 cm
Single Plane A2C EF: 63 %
Single Plane A4C EF: 59.5 %

## 2023-09-12 NOTE — Telephone Encounter (Signed)
-----   Message from Josph Macho sent at 09/12/2023  3:15 PM EST ----- Please call and let her know that the echocardiogram shows that her heart is functioning nicely.  Thanks.  Cindee Lame

## 2023-09-12 NOTE — Telephone Encounter (Signed)
 Called and informed patient of echo results. Patient verbalized understanding and denies any questions or concerns at this time.

## 2023-09-13 LAB — CYTOLOGY - NON PAP

## 2023-09-14 LAB — BODY FLUID CULTURE W GRAM STAIN: Culture: NO GROWTH

## 2023-09-24 ENCOUNTER — Encounter: Payer: Self-pay | Admitting: Hematology & Oncology

## 2023-09-25 ENCOUNTER — Encounter: Payer: Self-pay | Admitting: *Deleted

## 2023-09-25 ENCOUNTER — Other Ambulatory Visit (HOSPITAL_BASED_OUTPATIENT_CLINIC_OR_DEPARTMENT_OTHER): Payer: Self-pay

## 2023-09-25 ENCOUNTER — Encounter: Payer: Self-pay | Admitting: Hematology & Oncology

## 2023-09-25 ENCOUNTER — Inpatient Hospital Stay: Payer: Medicare (Managed Care) | Attending: Hematology & Oncology

## 2023-09-25 ENCOUNTER — Inpatient Hospital Stay (HOSPITAL_BASED_OUTPATIENT_CLINIC_OR_DEPARTMENT_OTHER): Payer: Medicare (Managed Care) | Admitting: Hematology & Oncology

## 2023-09-25 VITALS — BP 174/96 | HR 59 | Temp 98.2°F | Resp 20 | Ht 67.0 in | Wt 171.0 lb

## 2023-09-25 DIAGNOSIS — C7951 Secondary malignant neoplasm of bone: Secondary | ICD-10-CM | POA: Insufficient documentation

## 2023-09-25 DIAGNOSIS — C50919 Malignant neoplasm of unspecified site of unspecified female breast: Secondary | ICD-10-CM | POA: Insufficient documentation

## 2023-09-25 DIAGNOSIS — Z79811 Long term (current) use of aromatase inhibitors: Secondary | ICD-10-CM | POA: Insufficient documentation

## 2023-09-25 DIAGNOSIS — Z1721 Progesterone receptor positive status: Secondary | ICD-10-CM | POA: Insufficient documentation

## 2023-09-25 DIAGNOSIS — Z170 Estrogen receptor positive status [ER+]: Secondary | ICD-10-CM | POA: Diagnosis not present

## 2023-09-25 DIAGNOSIS — C782 Secondary malignant neoplasm of pleura: Secondary | ICD-10-CM | POA: Diagnosis not present

## 2023-09-25 DIAGNOSIS — Z1731 Human epidermal growth factor receptor 2 positive status: Secondary | ICD-10-CM | POA: Diagnosis not present

## 2023-09-25 LAB — CBC WITH DIFFERENTIAL (CANCER CENTER ONLY)
Abs Immature Granulocytes: 0 10*3/uL (ref 0.00–0.07)
Basophils Absolute: 0.1 10*3/uL (ref 0.0–0.1)
Basophils Relative: 2 %
Eosinophils Absolute: 0.1 10*3/uL (ref 0.0–0.5)
Eosinophils Relative: 2 %
HCT: 32.7 % — ABNORMAL LOW (ref 36.0–46.0)
Hemoglobin: 10.8 g/dL — ABNORMAL LOW (ref 12.0–15.0)
Immature Granulocytes: 0 %
Lymphocytes Relative: 49 %
Lymphs Abs: 1.7 10*3/uL (ref 0.7–4.0)
MCH: 28.2 pg (ref 26.0–34.0)
MCHC: 33 g/dL (ref 30.0–36.0)
MCV: 85.4 fL (ref 80.0–100.0)
Monocytes Absolute: 0.4 10*3/uL (ref 0.1–1.0)
Monocytes Relative: 11 %
Neutro Abs: 1.2 10*3/uL — ABNORMAL LOW (ref 1.7–7.7)
Neutrophils Relative %: 36 %
Platelet Count: 227 10*3/uL (ref 150–400)
RBC: 3.83 MIL/uL — ABNORMAL LOW (ref 3.87–5.11)
RDW: 23.8 % — ABNORMAL HIGH (ref 11.5–15.5)
WBC Count: 3.4 10*3/uL — ABNORMAL LOW (ref 4.0–10.5)
nRBC: 0 % (ref 0.0–0.2)

## 2023-09-25 LAB — CMP (CANCER CENTER ONLY)
ALT: 13 U/L (ref 0–44)
AST: 18 U/L (ref 15–41)
Albumin: 4 g/dL (ref 3.5–5.0)
Alkaline Phosphatase: 120 U/L (ref 38–126)
Anion gap: 6 (ref 5–15)
BUN: 8 mg/dL (ref 8–23)
CO2: 29 mmol/L (ref 22–32)
Calcium: 9 mg/dL (ref 8.9–10.3)
Chloride: 107 mmol/L (ref 98–111)
Creatinine: 0.92 mg/dL (ref 0.44–1.00)
GFR, Estimated: >60 mL/min (ref >60)
Glucose, Bld: 78 mg/dL (ref 70–99)
Potassium: 3.8 mmol/L (ref 3.5–5.1)
Sodium: 142 mmol/L (ref 135–145)
Total Bilirubin: 0.3 mg/dL (ref 0.0–1.2)
Total Protein: 6.8 g/dL (ref 6.5–8.1)

## 2023-09-25 MED ORDER — TELMISARTAN 40 MG PO TABS
40.0000 mg | ORAL_TABLET | Freq: Every day | ORAL | 5 refills | Status: DC
  Filled 2023-09-25: qty 30, 30d supply, fill #0
  Filled 2023-10-31: qty 30, 30d supply, fill #1
  Filled 2023-11-29: qty 30, 30d supply, fill #2
  Filled 2024-01-30: qty 30, 30d supply, fill #3
  Filled 2024-02-28: qty 30, 30d supply, fill #4
  Filled 2024-04-01: qty 30, 30d supply, fill #5

## 2023-09-25 NOTE — Progress Notes (Signed)
 Hematology and Oncology Follow Up Visit  Karina Karina White [id number] [birth year] 68 y.o. 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     Interim History:  Karina Karina White is back for follow-up.  She actually looks quite good.  She feels good.  She was had no complaints of shortness of breath.  I think the last time that we saw her, we did go ahead and do a thoracentesis.  This was done on 09/10/2023.  600 cc of fluid was removed.  Cytology not show any malignancy.  I think she is really doing well with the Femara /ribociclib .  Hopefully, we are starting to see that she is responding.  Her labs certainly look quite good.  Her last tumor marker-CA 27.29-was up a little bit more at 210.  She has had no diarrhea.  She has had no fever.  She has had no bleeding.  She has had no leg swelling.  She has had no cough.  A significant problem that she has is that she needs to have a tooth extracted.  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  Because of this, we are going to have to hold on her Xgeva .  Overall, I would say that her performance status is probably ECOG 1.   Medications:  Current Outpatient Medications:    acetaminophen  (TYLENOL ) 325 MG tablet, Take 2 tablets (650 mg total) by mouth every 6 (six) hours as needed for mild pain, fever or headache., Disp: 20 tablet, Rfl: 0   amLODipine  (NORVASC ) 10 MG tablet, Take 1 tablet (10 mg total) by mouth daily., Disp: 30 tablet, Rfl: 0   aspirin  EC 81 MG tablet, Take 81 mg by mouth daily., Disp: , Rfl:    carvedilol  (COREG ) 3.125 MG tablet, Take 1 tablet (3.125 mg total) by mouth 2 (two) times daily with a meal., Disp: 60 tablet, Rfl: 0    Cholecalciferol  25 MCG (1000 UT) capsule, Take 2 capsules (2,000 Units total) by mouth daily., Disp: 60 capsule, Rfl: 0   letrozole  (FEMARA ) 2.5 MG tablet, Take 1 tablet (2.5 mg total) by mouth daily., Disp: 30 tablet, Rfl: 6   Multiple Vitamins-Minerals (CENTRUM ADULTS PO), Take 1 tablet by mouth., Disp: , Rfl:    Omega 3 1200 MG CAPS, Take 1,200 mg by mouth daily., Disp: , Rfl:    omeprazole (PRILOSEC) 10 MG capsule, Take 10 mg by mouth daily., Disp: , Rfl:    ribociclib  succ (KISQALI , 600 MG DOSE,) 200 MG Therapy Pack, Take 3 tablets (600 mg total) by mouth daily. Take for 21 days on, 7 days off, repeat every 28 days., Disp: 63 tablet, Rfl: 0  Allergies:  Allergies  Allergen Reactions   Penicillins Hives and Itching   Codeine Nausea And Vomiting and Other (See Comments)   Penicillin G Other (See Comments)    Past Medical History, Surgical history, Social history, and Family History were reviewed and updated.  Review of Systems: Review of Systems  Constitutional: Negative.  Negative for appetite change and unexpected weight change.  HENT:  Negative.    Eyes: Negative.   Respiratory:  Positive for shortness of breath.   Gastrointestinal: Negative.  Endocrine: Negative.   Genitourinary: Negative.    Musculoskeletal:  Positive for back pain.  Skin: Negative.   Neurological: Negative.   Hematological: Negative.   Psychiatric/Behavioral: Negative.      Physical Exam:  height is 5\' 7"  (1.702 m) and weight is 171 lb (77.6 kg). Her oral temperature is 98.2 F (36.8 C). Her blood pressure is 174/96 (abnormal) and her pulse is 59 (abnormal). Her respiration is 20 and oxygen saturation is 100%.   Wt Readings from Last 3 Encounters:  09/25/23 171 lb (77.6 kg)  09/09/23 169 lb (76.7 kg)  08/14/23 169 lb 1.9 oz (76.7 kg)    Physical Exam Vitals reviewed.  Constitutional:      Comments: Her breast exam shows right breast with no masses, edema or erythema.  There is no right axillary  adenopathy.  Left breast does show some irregularity and some firmness at about the 12 o'clock position.  There is no tenderness.  There is no nipple discharge.  There is no left axillary adenopathy.  HENT:     Head: Normocephalic and atraumatic.  Eyes:     Pupils: Pupils are equal, round, and reactive to light.  Cardiovascular:     Rate and Rhythm: Normal rate and regular rhythm.     Heart sounds: Normal heart sounds.  Pulmonary:     Effort: Pulmonary effort is normal.     Breath sounds: Normal breath sounds.  Abdominal:     General: Bowel sounds are normal.     Palpations: Abdomen is soft.     Comments: Abdominal exam is soft.  She has decent bowel sounds.  There is no fluid wave.  There is no palpable liver or spleen tip.  Musculoskeletal:        General: No tenderness or deformity. Normal range of motion.     Cervical back: Normal range of motion.     Comments: Back exam shows some slight tenderness to palpation over the thoracic and lumbar spine.  Lymphadenopathy:     Cervical: No cervical adenopathy.  Skin:    General: Skin is warm and dry.     Findings: No erythema or rash.  Neurological:     Mental Status: She is alert and oriented to person, place, and time.  Psychiatric:        Behavior: Behavior normal.        Thought Content: Thought content normal.        Judgment: Judgment normal.     Lab Results  Component Value Date   WBC 3.4 (L) 09/25/2023   HGB 10.8 (L) 09/25/2023   HCT 32.7 (L) 09/25/2023   MCV 85.4 09/25/2023   PLT 227 09/25/2023     Chemistry      Component Value Date/Time   NA 142 09/09/2023 1110   K 4.1 09/09/2023 1110   CL 107 09/09/2023 1110   CO2 27 09/09/2023 1110   BUN 9 09/09/2023 1110   CREATININE 1.06 (H) 09/09/2023 1110      Component Value Date/Time   CALCIUM 8.6 (L) 09/09/2023 1110   ALKPHOS 129 (H) 09/09/2023 1110   AST 16 09/09/2023 1110   ALT 12 09/09/2023 1110   BILITOT 0.5 09/09/2023 1110      Impression and Plan: Ms.  Karina White is a very nice 68 year old Afro-American female.  She has stage IV adenocarcinoma of the breast.  Thankfully, her tumor is ER/PR positive.  It is low HER2 positive.  We currently have her on antiestrogen therapy.  Hopefully, we will start to see a response with the decrease in her CA 27.29.  I am just happy that her quality life is doing okay.  Again we really have to get her tooth taken care of.  I will have to see if we can get her to a dentist.  Unfortunately, we do not have a Designer, industrial/product at [facility].  I do not see that we have do any scans on her at the present time..  I do not think we have to do a chest x-ray.  Of note, her blood pressure has been on the high side.  I will go ahead and add Micardis  (40 mg p.o. daily) to her blood pressure medication regimen.  I think this to be reasonable.  I think she sees her family doctor in March.  I will plan to get her back in another month.     [name], MD 1/15/20251:45 PM

## 2023-09-25 NOTE — Progress Notes (Signed)
Patient is doing well on her kisqali/femara. She has an issue with her tooth and needs to see a dentist. As such her xgeva will be held.   Patient needs a Education officer, community. Instructed her to call her insurance to determine which providers would be in network, and schedule an appointment. Referrals are not needed.   Oncology Nurse Navigator Documentation     09/25/2023    2:30 PM  Oncology Nurse Navigator Flowsheets  Navigator Follow Up Date: 10/22/2023  Navigator Follow Up Reason: Follow-up Appointment  Navigator Location [facility]  Navigator Encounter Type Follow-up Appt  Patient Visit Type MedOnc  Treatment Phase Active Tx  Barriers/Navigation Needs Coordination of Care;Education  Education Other  Interventions Education;Psycho-Social Support  Acuity Level 2-Minimal Needs (1-2 Barriers Identified)  Education Method Verbal  Support Groups/Services Friends and Family  Time Spent with Patient 15

## 2023-09-25 NOTE — Progress Notes (Signed)
 BP remains elevated 162/84, instructed to monitor at home and notify PCP if it remains over140/90. Verbalized understanding.

## 2023-09-26 LAB — CANCER ANTIGEN 27.29: CA 27.29: 199.3 U/mL — ABNORMAL HIGH (ref 0.0–38.6)

## 2023-09-27 ENCOUNTER — Encounter: Payer: Self-pay | Admitting: Hematology & Oncology

## 2023-09-27 ENCOUNTER — Other Ambulatory Visit (HOSPITAL_BASED_OUTPATIENT_CLINIC_OR_DEPARTMENT_OTHER): Payer: Self-pay

## 2023-10-04 ENCOUNTER — Other Ambulatory Visit (HOSPITAL_BASED_OUTPATIENT_CLINIC_OR_DEPARTMENT_OTHER): Payer: Self-pay

## 2023-10-04 ENCOUNTER — Other Ambulatory Visit: Payer: Self-pay | Admitting: *Deleted

## 2023-10-04 MED ORDER — CARVEDILOL 3.125 MG PO TABS
3.1250 mg | ORAL_TABLET | Freq: Two times a day (BID) | ORAL | 0 refills | Status: DC
  Filled 2023-10-04: qty 60, 30d supply, fill #0

## 2023-10-08 ENCOUNTER — Other Ambulatory Visit (HOSPITAL_BASED_OUTPATIENT_CLINIC_OR_DEPARTMENT_OTHER): Payer: Self-pay

## 2023-10-22 ENCOUNTER — Inpatient Hospital Stay: Payer: Medicare (Managed Care) | Attending: Hematology & Oncology

## 2023-10-22 ENCOUNTER — Inpatient Hospital Stay (HOSPITAL_BASED_OUTPATIENT_CLINIC_OR_DEPARTMENT_OTHER): Payer: Medicare (Managed Care) | Admitting: Hematology & Oncology

## 2023-10-22 ENCOUNTER — Encounter: Payer: Self-pay | Admitting: Hematology & Oncology

## 2023-10-22 ENCOUNTER — Other Ambulatory Visit (HOSPITAL_BASED_OUTPATIENT_CLINIC_OR_DEPARTMENT_OTHER): Payer: Self-pay

## 2023-10-22 ENCOUNTER — Other Ambulatory Visit: Payer: Self-pay

## 2023-10-22 ENCOUNTER — Encounter: Payer: Self-pay | Admitting: *Deleted

## 2023-10-22 VITALS — BP 210/88 | HR 62 | Temp 98.6°F | Resp 20 | Ht 67.0 in | Wt 177.0 lb

## 2023-10-22 DIAGNOSIS — Z170 Estrogen receptor positive status [ER+]: Secondary | ICD-10-CM | POA: Diagnosis not present

## 2023-10-22 DIAGNOSIS — C782 Secondary malignant neoplasm of pleura: Secondary | ICD-10-CM

## 2023-10-22 DIAGNOSIS — Z1731 Human epidermal growth factor receptor 2 positive status: Secondary | ICD-10-CM

## 2023-10-22 DIAGNOSIS — C7951 Secondary malignant neoplasm of bone: Secondary | ICD-10-CM

## 2023-10-22 DIAGNOSIS — Z79811 Long term (current) use of aromatase inhibitors: Secondary | ICD-10-CM

## 2023-10-22 DIAGNOSIS — C50919 Malignant neoplasm of unspecified site of unspecified female breast: Secondary | ICD-10-CM | POA: Diagnosis present

## 2023-10-22 DIAGNOSIS — Z1721 Progesterone receptor positive status: Secondary | ICD-10-CM | POA: Diagnosis not present

## 2023-10-22 DIAGNOSIS — M7989 Other specified soft tissue disorders: Secondary | ICD-10-CM | POA: Diagnosis not present

## 2023-10-22 LAB — CBC WITH DIFFERENTIAL (CANCER CENTER ONLY)
Abs Immature Granulocytes: 0 10*3/uL (ref 0.00–0.07)
Basophils Absolute: 0 10*3/uL (ref 0.0–0.1)
Basophils Relative: 1 %
Eosinophils Absolute: 0.1 10*3/uL (ref 0.0–0.5)
Eosinophils Relative: 4 %
HCT: 33.6 % — ABNORMAL LOW (ref 36.0–46.0)
Hemoglobin: 11 g/dL — ABNORMAL LOW (ref 12.0–15.0)
Immature Granulocytes: 0 %
Lymphocytes Relative: 56 %
Lymphs Abs: 1.7 10*3/uL (ref 0.7–4.0)
MCH: 29.1 pg (ref 26.0–34.0)
MCHC: 32.7 g/dL (ref 30.0–36.0)
MCV: 88.9 fL (ref 80.0–100.0)
Monocytes Absolute: 0.3 10*3/uL (ref 0.1–1.0)
Monocytes Relative: 10 %
Neutro Abs: 0.9 10*3/uL — ABNORMAL LOW (ref 1.7–7.7)
Neutrophils Relative %: 29 %
Platelet Count: 188 10*3/uL (ref 150–400)
RBC: 3.78 MIL/uL — ABNORMAL LOW (ref 3.87–5.11)
RDW: 21.8 % — ABNORMAL HIGH (ref 11.5–15.5)
WBC Count: 3 10*3/uL — ABNORMAL LOW (ref 4.0–10.5)
nRBC: 0 % (ref 0.0–0.2)

## 2023-10-22 LAB — CMP (CANCER CENTER ONLY)
ALT: 11 U/L (ref 0–44)
AST: 18 U/L (ref 15–41)
Albumin: 4 g/dL (ref 3.5–5.0)
Alkaline Phosphatase: 102 U/L (ref 38–126)
Anion gap: 8 (ref 5–15)
BUN: 7 mg/dL — ABNORMAL LOW (ref 8–23)
CO2: 27 mmol/L (ref 22–32)
Calcium: 8.6 mg/dL — ABNORMAL LOW (ref 8.9–10.3)
Chloride: 111 mmol/L (ref 98–111)
Creatinine: 0.96 mg/dL (ref 0.44–1.00)
GFR, Estimated: >60 mL/min (ref >60)
Glucose, Bld: 88 mg/dL (ref 70–99)
Potassium: 4.2 mmol/L (ref 3.5–5.1)
Sodium: 146 mmol/L — ABNORMAL HIGH (ref 135–145)
Total Bilirubin: 0.3 mg/dL (ref 0.0–1.2)
Total Protein: 6.8 g/dL (ref 6.5–8.1)

## 2023-10-22 MED ORDER — CARVEDILOL 3.125 MG PO TABS
3.1250 mg | ORAL_TABLET | Freq: Two times a day (BID) | ORAL | 0 refills | Status: DC
  Filled 2023-10-22 – 2023-10-31 (×2): qty 60, 30d supply, fill #0

## 2023-10-22 MED ORDER — HYDROCHLOROTHIAZIDE 25 MG PO TABS
25.0000 mg | ORAL_TABLET | Freq: Every day | ORAL | 6 refills | Status: DC
  Filled 2023-10-22: qty 30, 30d supply, fill #0
  Filled 2023-11-29: qty 30, 30d supply, fill #1
  Filled 2023-11-29: qty 90, 90d supply, fill #1
  Filled 2024-01-16: qty 30, 30d supply, fill #2
  Filled 2024-02-14 (×2): qty 30, 30d supply, fill #3

## 2023-10-22 MED ORDER — POTASSIUM CHLORIDE CRYS ER 20 MEQ PO TBCR
20.0000 meq | EXTENDED_RELEASE_TABLET | ORAL | 1 refills | Status: DC
  Filled 2023-10-22: qty 30, 60d supply, fill #0
  Filled 2024-01-01: qty 30, 60d supply, fill #1

## 2023-10-22 NOTE — Progress Notes (Signed)
Hematology and Oncology Follow Up Visit  Clio Gerhart [id number] 02/02/1956 68 y.o. 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     Interim History:  Ms. Basso is back for follow-up.  I really think that she is doing better.  She did not have a thoracentesis now for over a month.  Her breathing is doing pretty well.  She has little more stamina which is nice to see.  She is doing well on the [employer].  She has had no problems with diarrhea.  Her appetite is doing good.  There has been no problems with bowels or bladder.  She has had no rashes.  She has little bit of leg swelling.  She does have high blood pressure.  I think she probably needs to have a diuretic.  I will send in hydrochlorothiazide 25 mg.  Hopefully, she will take her blood pressure medication.  Her last PET scan was done back in October.  We really need to get another one set up on her.  Her last tumor marker was down to 200.  Hopefully, it is lower at this time.  1 problem that she is having is that she needs to have some teeth extracted.  We have Xgeva on hold.  We need to find a dentist for her.  Overall, I would have said that her performance status is probably ECOG 1.   Medications:  Current Outpatient Medications:    acetaminophen (TYLENOL) 325 MG tablet, Take 2 tablets (650 mg total) by mouth every 6 (six) hours as needed for mild pain, fever or headache., Disp: 20 tablet, Rfl: 0   amLODipine (NORVASC) 10 MG tablet, Take 1 tablet (10 mg total) by mouth daily., Disp: 30 tablet, Rfl: 0   aspirin EC 81 MG tablet, Take 81 mg by mouth daily., Disp: , Rfl:    carvedilol (COREG) 3.125 MG tablet, Take 1 tablet (3.125 mg  total) by mouth 2 (two) times daily with a meal., Disp: 60 tablet, Rfl: 0   Cholecalciferol 25 MCG (1000 UT) capsule, Take 2 capsules (2,000 Units total) by mouth daily., Disp: 60 capsule, Rfl: 0   letrozole (FEMARA) 2.5 MG tablet, Take 1 tablet (2.5 mg total) by mouth daily., Disp: 30 tablet, Rfl: 6   Multiple Vitamins-Minerals (CENTRUM ADULTS PO), Take 1 tablet by mouth., Disp: , Rfl:    Omega 3 1200 MG CAPS, Take 1,200 mg by mouth daily., Disp: , Rfl:    omeprazole (PRILOSEC) 10 MG capsule, Take 10 mg by mouth daily., Disp: , Rfl:    ribociclib succ (KISQALI, 600 MG DOSE,) 200 MG Therapy Pack, Take 3 tablets (600 mg total) by mouth daily. Take for 21 days on, 7 days off, repeat every 28 days., Disp: 63 tablet, Rfl: 0   telmisartan (MICARDIS) 40 MG tablet, Take 1 tablet (40 mg total) by mouth daily., Disp: 30 tablet, Rfl: 5  Allergies:  Allergies  Allergen Reactions   Penicillins Hives and Itching   Codeine Nausea And Vomiting and Other (See Comments)   Penicillin G Other (See Comments)    Past Medical History, Surgical history, Social history, and Family History were reviewed and updated.  Review of Systems: Review  of Systems  Constitutional: Negative.  Negative for appetite change and unexpected weight change.  HENT:  Negative.    Eyes: Negative.   Respiratory:  Positive for shortness of breath.   Gastrointestinal: Negative.   Endocrine: Negative.   Genitourinary: Negative.    Musculoskeletal:  Positive for back pain.  Skin: Negative.   Neurological: Negative.   Hematological: Negative.   Psychiatric/Behavioral: Negative.      Physical Exam:  98.6.  Pulse 62.  Blood pressure 200/88.  Weight is 177 pounds.  Wt Readings from Last 3 Encounters:  09/25/23 171 lb (77.6 kg)  09/09/23 169 lb (76.7 kg)  08/14/23 169 lb 1.9 oz (76.7 kg)    Physical Exam Vitals reviewed.  Constitutional:      Comments: Her breast exam shows right breast with no masses, edema or erythema.   There is no right axillary adenopathy.  Left breast does show some irregularity and some firmness at about the 12 o'clock position.  There is no tenderness.  There is no nipple discharge.  There is no left axillary adenopathy.  HENT:     Head: Normocephalic and atraumatic.  Eyes:     Pupils: Pupils are equal, round, and reactive to light.  Cardiovascular:     Rate and Rhythm: Normal rate and regular rhythm.     Heart sounds: Normal heart sounds.  Pulmonary:     Effort: Pulmonary effort is normal.     Breath sounds: Normal breath sounds.  Abdominal:     General: Bowel sounds are normal.     Palpations: Abdomen is soft.     Comments: Abdominal exam is soft.  She has decent bowel sounds.  There is no fluid wave.  There is no palpable liver or spleen tip.  Musculoskeletal:        General: No tenderness or deformity. Normal range of motion.     Cervical back: Normal range of motion.     Comments: Back exam shows some slight tenderness to palpation over the thoracic and lumbar spine.  Lymphadenopathy:     Cervical: No cervical adenopathy.  Skin:    General: Skin is warm and dry.     Findings: No erythema or rash.  Neurological:     Mental Status: She is alert and oriented to person, place, and time.  Psychiatric:        Behavior: Behavior normal.        Thought Content: Thought content normal.        Judgment: Judgment normal.      Lab Results  Component Value Date   WBC 3.0 (L) 10/22/2023   HGB 11.0 (L) 10/22/2023   HCT 33.6 (L) 10/22/2023   MCV 88.9 10/22/2023   PLT 188 10/22/2023     Chemistry      Component Value Date/Time   NA 142 09/25/2023 1302   K 3.8 09/25/2023 1302   CL 107 09/25/2023 1302   CO2 29 09/25/2023 1302   BUN 8 09/25/2023 1302   CREATININE 0.92 09/25/2023 1302      Component Value Date/Time   CALCIUM 9.0 09/25/2023 1302   ALKPHOS 120 09/25/2023 1302   AST 18 09/25/2023 1302   ALT 13 09/25/2023 1302   BILITOT 0.3 09/25/2023 1302       Impression and Plan: Ms. Rosello is a very nice 68 year old Afro-American female.  She has stage IV adenocarcinoma of the breast.  Thankfully, her tumor is ER/PR positive.  It is low HER2 positive.  We currently  have her on antiestrogen therapy.  Hopefully, we will start to see a response with the decrease in her CA 27.29.  We will go ahead with her PET scan.  I will order the PET scan to be done in a couple weeks.  Again we will have to see about getting teeth extracted.  I just wish we would have our Dentist at [facility].  That would make life a whole out easier.  I will still plan to get her back to another month.   Josph Macho, MD 2/11/202511:42 AM

## 2023-10-22 NOTE — Progress Notes (Signed)
BP remains elevated, 187/77.   Instructed patient to take BP meds while in office. Instructed to monitor at home and keep a diary for her PCP visit. Verbalized understanding. Daughter at bedside and understands instructions as well.

## 2023-10-22 NOTE — Progress Notes (Signed)
Patient is doing well with treatment. Her symptoms related to her cancer are improving. She will need a PET prior to her next provider appointment.   Oncology Nurse Navigator Documentation     10/22/2023   10:45 AM  Oncology Nurse Navigator Flowsheets  Navigator Follow Up Date: 10/23/2023  Navigator Follow Up Reason: Radiology  Navigator Location [facility]  Navigator Encounter Type Follow-up Appt  Patient Visit Type MedOnc  Treatment Phase Active Tx  Barriers/Navigation Needs Coordination of Care;Education  Interventions None Required  Acuity Level 2-Minimal Needs (1-2 Barriers Identified)  Support Groups/Services Friends and Family  Time Spent with Patient 15

## 2023-10-23 ENCOUNTER — Encounter: Payer: Self-pay | Admitting: *Deleted

## 2023-10-23 LAB — CANCER ANTIGEN 27.29: CA 27.29: 155.4 U/mL — ABNORMAL HIGH (ref 0.0–38.6)

## 2023-10-23 NOTE — Progress Notes (Signed)
PET scheduled for 11/11/2023  Oncology Nurse Navigator Documentation     10/23/2023    9:45 AM  Oncology Nurse Navigator Flowsheets  Navigator Follow Up Date: 11/11/2023  Navigator Follow Up Reason: Scan Review  Navigator Location [facility]  Navigator Encounter Type Appt/Treatment Plan Review  Patient Visit Type MedOnc  Treatment Phase Active Tx  Barriers/Navigation Needs Coordination of Care;Education  Interventions None Required  Acuity Level 2-Minimal Needs (1-2 Barriers Identified)  Support Groups/Services Friends and Family  Time Spent with Patient 15

## 2023-10-31 ENCOUNTER — Other Ambulatory Visit (HOSPITAL_BASED_OUTPATIENT_CLINIC_OR_DEPARTMENT_OTHER): Payer: Self-pay

## 2023-11-01 ENCOUNTER — Other Ambulatory Visit (HOSPITAL_BASED_OUTPATIENT_CLINIC_OR_DEPARTMENT_OTHER): Payer: Self-pay

## 2023-11-11 ENCOUNTER — Encounter (HOSPITAL_COMMUNITY)
Admission: RE | Admit: 2023-11-11 | Discharge: 2023-11-11 | Disposition: A | Discharge status: Home / Self Care | Payer: Medicare (Managed Care) | Source: Ambulatory Visit | Attending: Hematology & Oncology | Admitting: Hematology & Oncology

## 2023-11-11 DIAGNOSIS — C7951 Secondary malignant neoplasm of bone: Secondary | ICD-10-CM | POA: Insufficient documentation

## 2023-11-11 DIAGNOSIS — I2510 Atherosclerotic heart disease of native coronary artery without angina pectoris: Secondary | ICD-10-CM | POA: Insufficient documentation

## 2023-11-11 DIAGNOSIS — C50919 Malignant neoplasm of unspecified site of unspecified female breast: Secondary | ICD-10-CM | POA: Insufficient documentation

## 2023-11-11 DIAGNOSIS — J90 Pleural effusion, not elsewhere classified: Secondary | ICD-10-CM | POA: Diagnosis not present

## 2023-11-11 DIAGNOSIS — R918 Other nonspecific abnormal finding of lung field: Secondary | ICD-10-CM | POA: Diagnosis not present

## 2023-11-11 DIAGNOSIS — J329 Chronic sinusitis, unspecified: Secondary | ICD-10-CM | POA: Insufficient documentation

## 2023-11-11 DIAGNOSIS — I700 Atherosclerosis of aorta: Secondary | ICD-10-CM | POA: Diagnosis not present

## 2023-11-11 LAB — GLUCOSE, CAPILLARY: Glucose-Capillary: 63 mg/dL — ABNORMAL LOW (ref 70–99)

## 2023-11-11 MED ORDER — FLUDEOXYGLUCOSE F - 18 (FDG) INJECTION
8.4000 | Freq: Once | INTRAVENOUS | Status: AC | PRN
  Administered 2023-11-11: 8.39 via INTRAVENOUS

## 2023-11-19 ENCOUNTER — Inpatient Hospital Stay: Payer: Medicare (Managed Care) | Attending: Hematology & Oncology

## 2023-11-19 ENCOUNTER — Encounter: Payer: Self-pay | Admitting: *Deleted

## 2023-11-19 ENCOUNTER — Other Ambulatory Visit: Payer: Self-pay

## 2023-11-19 ENCOUNTER — Other Ambulatory Visit (HOSPITAL_BASED_OUTPATIENT_CLINIC_OR_DEPARTMENT_OTHER): Payer: Self-pay

## 2023-11-19 ENCOUNTER — Encounter: Payer: Self-pay | Admitting: Hematology & Oncology

## 2023-11-19 ENCOUNTER — Inpatient Hospital Stay (HOSPITAL_BASED_OUTPATIENT_CLINIC_OR_DEPARTMENT_OTHER): Payer: Medicare (Managed Care) | Admitting: Hematology & Oncology

## 2023-11-19 VITALS — BP 156/75 | HR 62 | Temp 98.6°F | Resp 18 | Ht 67.0 in | Wt 179.0 lb

## 2023-11-19 DIAGNOSIS — C50912 Malignant neoplasm of unspecified site of left female breast: Secondary | ICD-10-CM | POA: Diagnosis not present

## 2023-11-19 DIAGNOSIS — C50919 Malignant neoplasm of unspecified site of unspecified female breast: Secondary | ICD-10-CM | POA: Insufficient documentation

## 2023-11-19 DIAGNOSIS — Z79811 Long term (current) use of aromatase inhibitors: Secondary | ICD-10-CM | POA: Insufficient documentation

## 2023-11-19 DIAGNOSIS — Z170 Estrogen receptor positive status [ER+]: Secondary | ICD-10-CM | POA: Insufficient documentation

## 2023-11-19 DIAGNOSIS — C7951 Secondary malignant neoplasm of bone: Secondary | ICD-10-CM | POA: Insufficient documentation

## 2023-11-19 DIAGNOSIS — C782 Secondary malignant neoplasm of pleura: Secondary | ICD-10-CM | POA: Diagnosis not present

## 2023-11-19 DIAGNOSIS — J90 Pleural effusion, not elsewhere classified: Secondary | ICD-10-CM | POA: Insufficient documentation

## 2023-11-19 DIAGNOSIS — Z1731 Human epidermal growth factor receptor 2 positive status: Secondary | ICD-10-CM | POA: Insufficient documentation

## 2023-11-19 DIAGNOSIS — Z1721 Progesterone receptor positive status: Secondary | ICD-10-CM | POA: Insufficient documentation

## 2023-11-19 LAB — CMP (CANCER CENTER ONLY)
ALT: 11 U/L (ref 0–44)
AST: 17 U/L (ref 15–41)
Albumin: 4.4 g/dL (ref 3.5–5.0)
Alkaline Phosphatase: 88 U/L (ref 38–126)
Anion gap: 7 (ref 5–15)
BUN: 13 mg/dL (ref 8–23)
CO2: 28 mmol/L (ref 22–32)
Calcium: 9.3 mg/dL (ref 8.9–10.3)
Chloride: 107 mmol/L (ref 98–111)
Creatinine: 1.2 mg/dL — ABNORMAL HIGH (ref 0.44–1.00)
GFR, Estimated: 49 mL/min — ABNORMAL LOW (ref >60)
Glucose, Bld: 87 mg/dL (ref 70–99)
Potassium: 4 mmol/L (ref 3.5–5.1)
Sodium: 142 mmol/L (ref 135–145)
Total Bilirubin: 0.4 mg/dL (ref 0.0–1.2)
Total Protein: 7.3 g/dL (ref 6.5–8.1)

## 2023-11-19 LAB — CBC WITH DIFFERENTIAL (CANCER CENTER ONLY)
Abs Immature Granulocytes: 0.01 10*3/uL (ref 0.00–0.07)
Basophils Absolute: 0.1 10*3/uL (ref 0.0–0.1)
Basophils Relative: 2 %
Eosinophils Absolute: 0.1 10*3/uL (ref 0.0–0.5)
Eosinophils Relative: 3 %
HCT: 34 % — ABNORMAL LOW (ref 36.0–46.0)
Hemoglobin: 11.4 g/dL — ABNORMAL LOW (ref 12.0–15.0)
Immature Granulocytes: 0 %
Lymphocytes Relative: 45 %
Lymphs Abs: 1.5 10*3/uL (ref 0.7–4.0)
MCH: 30.2 pg (ref 26.0–34.0)
MCHC: 33.5 g/dL (ref 30.0–36.0)
MCV: 89.9 fL (ref 80.0–100.0)
Monocytes Absolute: 0.3 10*3/uL (ref 0.1–1.0)
Monocytes Relative: 10 %
Neutro Abs: 1.3 10*3/uL — ABNORMAL LOW (ref 1.7–7.7)
Neutrophils Relative %: 40 %
Platelet Count: 206 10*3/uL (ref 150–400)
RBC: 3.78 MIL/uL — ABNORMAL LOW (ref 3.87–5.11)
RDW: 18.6 % — ABNORMAL HIGH (ref 11.5–15.5)
Smear Review: NORMAL
WBC Count: 3.3 10*3/uL — ABNORMAL LOW (ref 4.0–10.5)
WBC Morphology: >10
nRBC: 0 % (ref 0.0–0.2)

## 2023-11-19 LAB — LACTATE DEHYDROGENASE: LDH: 207 U/L — ABNORMAL HIGH (ref 98–192)

## 2023-11-19 MED ORDER — CARVEDILOL 3.125 MG PO TABS
3.1250 mg | ORAL_TABLET | Freq: Two times a day (BID) | ORAL | 0 refills | Status: DC
  Filled 2023-11-19: qty 60, 30d supply, fill #0
  Filled ????-??-??: fill #0

## 2023-11-19 MED ORDER — CARVEDILOL 3.125 MG PO TABS
3.1250 mg | ORAL_TABLET | Freq: Two times a day (BID) | ORAL | 6 refills | Status: DC
  Filled 2023-11-19 – 2023-11-22 (×3): qty 60, 30d supply, fill #0
  Filled 2023-12-25 (×2): qty 60, 30d supply, fill #1
  Filled 2024-01-16: qty 60, 30d supply, fill #2
  Filled 2024-02-14 (×2): qty 60, 30d supply, fill #3
  Filled 2024-03-18: qty 60, 30d supply, fill #4
  Filled 2024-04-16: qty 60, 30d supply, fill #5
  Filled 2024-05-13: qty 60, 30d supply, fill #6

## 2023-11-19 NOTE — Progress Notes (Signed)
 Hematology and Oncology Follow Up Visit  Olla Delancey [id number] Aug 24, 1956 68 y.o. 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     Interim History:  Ms. Grisanti is back for follow-up.  She does look quite good.  She is feeling okay.  She was not complaining of any pain.  There is no shortness of breath.  We did do a PET scan on her.  This was done on March 3.  Thankfully, the PET scan showed that she has had a very nice response to therapy.  Her bony metastatic disease pretty much has resolved.  She still has the right pleural effusion.  However, there seems to be less activity in the right upper lung.  Her last CA 27.29 was down to 155.  She is tolerated the Femara quite nicely.   She still has not had the infected tooth taken care of.  I will have to see if my dentist would be able to help her out.  She has had no fever.  She has had no change in bowel or bladder habits.  She has had no headache.  There is been no mouth sores.  Currently, I would have to say that her performance status is probably ECOG 1.   Medications:  Current Outpatient Medications:    amLODipine (NORVASC) 10 MG tablet, Take 1 tablet (10 mg total) by mouth daily., Disp: 30 tablet, Rfl: 0   aspirin EC 81 MG tablet, Take 81 mg by mouth daily., Disp: , Rfl:    carvedilol (COREG) 3.125 MG tablet, Take 1 tablet (3.125 mg total) by mouth 2 (two) times daily with a meal., Disp: 60 tablet, Rfl: 0   hydrochlorothiazide (HYDRODIURIL) 25 MG tablet, Take 1 tablet (25 mg total) by mouth daily., Disp: 30 tablet, Rfl: 6   letrozole (FEMARA) 2.5 MG tablet, Take 1 tablet (2.5 mg total) by mouth daily., Disp: 30 tablet, Rfl: 6   Multiple  Vitamins-Minerals (CENTRUM ADULTS PO), Take 1 tablet by mouth., Disp: , Rfl:    Omega 3 1200 MG CAPS, Take 1,200 mg by mouth daily., Disp: , Rfl:    omeprazole (PRILOSEC) 10 MG capsule, Take 10 mg by mouth daily., Disp: , Rfl:    potassium chloride SA (KLOR-CON M) 20 MEQ tablet, Take 1 tablet (20 mEq total) by mouth every other day., Disp: 30 tablet, Rfl: 1   ribociclib succ (KISQALI, 600 MG DOSE,) 200 MG Therapy Pack, Take 3 tablets (600 mg total) by mouth daily. Take for 21 days on, 7 days off, repeat every 28 days., Disp: 63 tablet, Rfl: 0   telmisartan (MICARDIS) 40 MG tablet, Take 1 tablet (40 mg total) by mouth daily., Disp: 30 tablet, Rfl: 5   VITAMIN D, CHOLECALCIFEROL, PO, Take 2,000 Units by mouth daily., Disp: , Rfl:   Allergies:  Allergies  Allergen Reactions   Penicillins Hives and Itching   Codeine Nausea And Vomiting and Other (See Comments)   Penicillin G Other (See Comments)    Past Medical History, Surgical history, Social history, and Family History were reviewed and updated.  Review of Systems: Review of Systems  Constitutional: Negative.  Negative for appetite change and unexpected weight change.  HENT:  Negative.    Eyes: Negative.   Respiratory:  Positive for shortness of breath.   Gastrointestinal: Negative.   Endocrine: Negative.   Genitourinary: Negative.    Musculoskeletal:  Positive for back pain.  Skin: Negative.   Neurological: Negative.   Hematological: Negative.   Psychiatric/Behavioral: Negative.      Physical Exam:  Vital signs show temperature of 98.6.  Pulse 62.  Blood pressure 156/75.  Weight is 179 pounds.    Wt Readings from Last 3 Encounters:  11/19/23 179 lb (81.2 kg)  10/22/23 177 lb (80.3 kg)  09/25/23 171 lb (77.6 kg)    Physical Exam Vitals reviewed.  Constitutional:      Comments: Her breast exam shows right breast with no masses, edema or erythema.  There is no right axillary adenopathy.  Left breast does show some  irregularity and some firmness at about the 12 o'clock position.  There is no tenderness.  There is no nipple discharge.  There is no left axillary adenopathy.  HENT:     Head: Normocephalic and atraumatic.  Eyes:     Pupils: Pupils are equal, round, and reactive to light.  Cardiovascular:     Rate and Rhythm: Normal rate and regular rhythm.     Heart sounds: Normal heart sounds.  Pulmonary:     Effort: Pulmonary effort is normal.     Breath sounds: Normal breath sounds.  Abdominal:     General: Bowel sounds are normal.     Palpations: Abdomen is soft.     Comments: Abdominal exam is soft.  She has decent bowel sounds.  There is no fluid wave.  There is no palpable liver or spleen tip.  Musculoskeletal:        General: No tenderness or deformity. Normal range of motion.     Cervical back: Normal range of motion.     Comments: Back exam shows some slight tenderness to palpation over the thoracic and lumbar spine.  Lymphadenopathy:     Cervical: No cervical adenopathy.  Skin:    General: Skin is warm and dry.     Findings: No erythema or rash.  Neurological:     Mental Status: She is alert and oriented to person, place, and time.  Psychiatric:        Behavior: Behavior normal.        Thought Content: Thought content normal.        Judgment: Judgment normal.     Lab Results  Component Value Date   WBC 3.3 (L) 11/19/2023   HGB 11.4 (L) 11/19/2023   HCT 34.0 (L) 11/19/2023   MCV 89.9 11/19/2023   PLT 206 11/19/2023     Chemistry      Component Value Date/Time   NA 142 11/19/2023 1014   K 4.0 11/19/2023 1014   CL 107 11/19/2023 1014   CO2 28 11/19/2023 1014   BUN 13 11/19/2023 1014   CREATININE 1.20 (H) 11/19/2023 1014      Component Value Date/Time   CALCIUM 9.3 11/19/2023 1014   ALKPHOS 88 11/19/2023 1014   AST 17 11/19/2023 1014   ALT 11 11/19/2023 1014   BILITOT 0.4 11/19/2023 1014      Impression and Plan: Ms. Navedo is a very nice 68 year old  Afro-American female.  She has stage IV adenocarcinoma of the breast.  Thankfully, her tumor is ER/PR positive.  It is low HER2 positive.  We currently have her on antiestrogen therapy.    I think that the  PET scan clearly shows [place] that she is responding.  Hopefully, we will continue to see her CA 27.29 decreased.  We really have to get this tooth taken care of.  If not, there would not come be able to really use Zometa or Xgeva.  I do want to get the fluid taken out of the right lung again.  I just do not the fluid sit there and then always have a becomes a gel that we will not be able to get rid of.  I am happy that her quality of life is doing okay.  We will going plan to get her back in another 6 weeks.     Josph Macho, MD 3/11/202511:26 AM

## 2023-11-19 NOTE — Progress Notes (Unsigned)
 Reviewed PET which shows excellent treatment response. She will continue with current treatment.   Oncology Nurse Navigator Documentation     11/19/2023   10:15 AM  Oncology Nurse Navigator Flowsheets  Navigator Follow Up Date: 01/01/2024  Navigator Follow Up Reason: Follow-up Appointment  Navigator Location [facility]  Navigator Encounter Type Follow-up Appt;Scan Review  Patient Visit Type MedOnc  Treatment Phase Active Tx  Barriers/Navigation Needs Coordination of Care;Education  Interventions Psycho-Social Support  Acuity Level 2-Minimal Needs (1-2 Barriers Identified)  Support Groups/Services Friends and Family  Time Spent with Patient 15

## 2023-11-20 ENCOUNTER — Inpatient Hospital Stay: Payer: Medicare (Managed Care) | Admitting: Licensed Clinical Social Worker

## 2023-11-20 ENCOUNTER — Telehealth: Payer: Self-pay

## 2023-11-20 LAB — CANCER ANTIGEN 27.29: CA 27.29: 118.2 U/mL — ABNORMAL HIGH (ref 0.0–38.6)

## 2023-11-20 NOTE — Telephone Encounter (Signed)
Called and informed patient of lab results, patient verbalized understanding and denies any questions or concerns at this time.   

## 2023-11-20 NOTE — Progress Notes (Signed)
 [facility] CSW Progress Note  Clinical Child psychotherapist contacted caregiver by phone to assess needs.  Spoke with patient's daughter, Karina White, who inquired about grants.  Patient is not eligible for the [employer].  She does not receive government assistance.  CSW to make referral for bill pay assistance with Cancer Services and will mail her breast cancer application information.    Dorothey Baseman, LCSW Clinical Social Worker [facility]

## 2023-11-20 NOTE — Telephone Encounter (Signed)
-----   Message from Josph Macho sent at 11/20/2023  9:22 AM EDT ----- Call and let her know that the tumor marker is now down to 118.  He started at 220.  What is this this is great news.  Keep up the good job.  Cindee Lame

## 2023-11-21 ENCOUNTER — Ambulatory Visit (HOSPITAL_COMMUNITY)
Admission: RE | Admit: 2023-11-21 | Discharge: 2023-11-21 | Disposition: A | Discharge status: Home / Self Care | Payer: Medicare (Managed Care) | Source: Ambulatory Visit | Attending: Radiology | Admitting: Radiology

## 2023-11-21 ENCOUNTER — Encounter: Payer: Self-pay | Admitting: Hematology & Oncology

## 2023-11-21 ENCOUNTER — Ambulatory Visit (HOSPITAL_COMMUNITY)
Admission: RE | Admit: 2023-11-21 | Discharge: 2023-11-21 | Disposition: A | Discharge status: Home / Self Care | Payer: Medicare (Managed Care) | Source: Ambulatory Visit | Attending: Hematology & Oncology | Admitting: Hematology & Oncology

## 2023-11-21 DIAGNOSIS — C7951 Secondary malignant neoplasm of bone: Secondary | ICD-10-CM | POA: Insufficient documentation

## 2023-11-21 DIAGNOSIS — Z853 Personal history of malignant neoplasm of breast: Secondary | ICD-10-CM | POA: Diagnosis not present

## 2023-11-21 DIAGNOSIS — C50919 Malignant neoplasm of unspecified site of unspecified female breast: Secondary | ICD-10-CM | POA: Diagnosis present

## 2023-11-21 DIAGNOSIS — J90 Pleural effusion, not elsewhere classified: Secondary | ICD-10-CM | POA: Insufficient documentation

## 2023-11-21 MED ORDER — LIDOCAINE HCL 1 % IJ SOLN
INTRAMUSCULAR | Status: AC
  Filled 2023-11-21: qty 20

## 2023-11-21 NOTE — Procedures (Signed)
 Ultrasound-guided diagnostic and therapeutic right thoracentesis performed yielding 900 cc of yellow  fluid. No immediate complications. Follow-up chest x-ray pending. EBL< 2 cc.

## 2023-11-22 ENCOUNTER — Other Ambulatory Visit (HOSPITAL_BASED_OUTPATIENT_CLINIC_OR_DEPARTMENT_OTHER): Payer: Self-pay

## 2023-11-26 LAB — CYTOLOGY - NON PAP

## 2023-11-29 ENCOUNTER — Other Ambulatory Visit (HOSPITAL_BASED_OUTPATIENT_CLINIC_OR_DEPARTMENT_OTHER): Payer: Self-pay

## 2023-12-05 ENCOUNTER — Telehealth: Payer: Self-pay | Admitting: *Deleted

## 2023-12-05 NOTE — Telephone Encounter (Signed)
 Received a call from patient letting us know that she saw the dentist Dr Toni Arthurs who was not comfortable doing an oral extraction so she was referred to Dr Tillman Sers an oral surgeon.  She has an appointment with him for an oral extraction.  Dr [name] notified.

## 2023-12-06 ENCOUNTER — Other Ambulatory Visit (HOSPITAL_BASED_OUTPATIENT_CLINIC_OR_DEPARTMENT_OTHER): Payer: Self-pay

## 2023-12-19 ENCOUNTER — Inpatient Hospital Stay: Payer: Medicare (Managed Care) | Attending: Hematology & Oncology

## 2023-12-19 DIAGNOSIS — Z1721 Progesterone receptor positive status: Secondary | ICD-10-CM | POA: Insufficient documentation

## 2023-12-19 DIAGNOSIS — Z1731 Human epidermal growth factor receptor 2 positive status: Secondary | ICD-10-CM | POA: Insufficient documentation

## 2023-12-19 DIAGNOSIS — C7951 Secondary malignant neoplasm of bone: Secondary | ICD-10-CM | POA: Insufficient documentation

## 2023-12-19 DIAGNOSIS — Z170 Estrogen receptor positive status [ER+]: Secondary | ICD-10-CM | POA: Insufficient documentation

## 2023-12-19 DIAGNOSIS — Z79811 Long term (current) use of aromatase inhibitors: Secondary | ICD-10-CM | POA: Insufficient documentation

## 2023-12-19 DIAGNOSIS — C50919 Malignant neoplasm of unspecified site of unspecified female breast: Secondary | ICD-10-CM | POA: Insufficient documentation

## 2023-12-19 DIAGNOSIS — C782 Secondary malignant neoplasm of pleura: Secondary | ICD-10-CM | POA: Insufficient documentation

## 2023-12-19 NOTE — Progress Notes (Signed)
 [facility] CSW Progress Note  Clinical Child psychotherapist received phone call from patient requesting assistance with completing breast cancer grant forms.  Patient to meet with CSW at [facility] next Wednesday to review documents.    Karina Baseman, LCSW Clinical Social Worker [facility]

## 2023-12-25 ENCOUNTER — Inpatient Hospital Stay: Payer: Medicare (Managed Care) | Admitting: Licensed Clinical Social Worker

## 2023-12-25 ENCOUNTER — Other Ambulatory Visit (HOSPITAL_BASED_OUTPATIENT_CLINIC_OR_DEPARTMENT_OTHER): Payer: Self-pay

## 2023-12-25 ENCOUNTER — Other Ambulatory Visit: Payer: Self-pay

## 2023-12-25 NOTE — Progress Notes (Signed)
 [facility] CSW Progress Note  Visual merchandiser met with patient and her daughter, Kenney Peacemaker, to complete breast cancer grant applications.  Completed three applications and made copies for patient.  She still needs to add her story to two of the applications and will mail herself.  She was appreciative of the assistance.    Kennth Peal, LCSW Clinical Social Worker [facility]

## 2024-01-01 ENCOUNTER — Encounter: Payer: Self-pay | Admitting: *Deleted

## 2024-01-01 ENCOUNTER — Other Ambulatory Visit (HOSPITAL_BASED_OUTPATIENT_CLINIC_OR_DEPARTMENT_OTHER): Payer: Self-pay

## 2024-01-01 ENCOUNTER — Inpatient Hospital Stay (HOSPITAL_BASED_OUTPATIENT_CLINIC_OR_DEPARTMENT_OTHER): Payer: Medicare (Managed Care) | Admitting: Hematology & Oncology

## 2024-01-01 ENCOUNTER — Inpatient Hospital Stay: Payer: Medicare (Managed Care)

## 2024-01-01 ENCOUNTER — Encounter: Payer: Self-pay | Admitting: Hematology & Oncology

## 2024-01-01 VITALS — BP 184/75 | HR 71 | Temp 97.9°F | Resp 20 | Ht 67.0 in | Wt 189.0 lb

## 2024-01-01 DIAGNOSIS — Z1721 Progesterone receptor positive status: Secondary | ICD-10-CM | POA: Diagnosis not present

## 2024-01-01 DIAGNOSIS — C782 Secondary malignant neoplasm of pleura: Secondary | ICD-10-CM | POA: Diagnosis not present

## 2024-01-01 DIAGNOSIS — C7951 Secondary malignant neoplasm of bone: Secondary | ICD-10-CM

## 2024-01-01 DIAGNOSIS — C50912 Malignant neoplasm of unspecified site of left female breast: Secondary | ICD-10-CM

## 2024-01-01 DIAGNOSIS — Z79811 Long term (current) use of aromatase inhibitors: Secondary | ICD-10-CM | POA: Diagnosis not present

## 2024-01-01 DIAGNOSIS — C50919 Malignant neoplasm of unspecified site of unspecified female breast: Secondary | ICD-10-CM

## 2024-01-01 DIAGNOSIS — Z170 Estrogen receptor positive status [ER+]: Secondary | ICD-10-CM | POA: Diagnosis not present

## 2024-01-01 DIAGNOSIS — Z1731 Human epidermal growth factor receptor 2 positive status: Secondary | ICD-10-CM | POA: Diagnosis not present

## 2024-01-01 LAB — CMP (CANCER CENTER ONLY)
ALT: 12 U/L (ref 0–44)
AST: 18 U/L (ref 15–41)
Albumin: 4.3 g/dL (ref 3.5–5.0)
Alkaline Phosphatase: 71 U/L (ref 38–126)
Anion gap: 7 (ref 5–15)
BUN: 11 mg/dL (ref 8–23)
CO2: 29 mmol/L (ref 22–32)
Calcium: 9.1 mg/dL (ref 8.9–10.3)
Chloride: 105 mmol/L (ref 98–111)
Creatinine: 1.34 mg/dL — ABNORMAL HIGH (ref 0.44–1.00)
GFR, Estimated: 43 mL/min — ABNORMAL LOW (ref >60)
Glucose, Bld: 89 mg/dL (ref 70–99)
Potassium: 3.9 mmol/L (ref 3.5–5.1)
Sodium: 141 mmol/L (ref 135–145)
Total Bilirubin: 0.6 mg/dL (ref 0.0–1.2)
Total Protein: 6.8 g/dL (ref 6.5–8.1)

## 2024-01-01 LAB — CBC WITH DIFFERENTIAL (CANCER CENTER ONLY)
Abs Immature Granulocytes: 0.01 10*3/uL (ref 0.00–0.07)
Basophils Absolute: 0 10*3/uL (ref 0.0–0.1)
Basophils Relative: 1 %
Eosinophils Absolute: 0.1 10*3/uL (ref 0.0–0.5)
Eosinophils Relative: 2 %
HCT: 32.6 % — ABNORMAL LOW (ref 36.0–46.0)
Hemoglobin: 10.9 g/dL — ABNORMAL LOW (ref 12.0–15.0)
Immature Granulocytes: 0 %
Lymphocytes Relative: 58 %
Lymphs Abs: 1.8 10*3/uL (ref 0.7–4.0)
MCH: 31.2 pg (ref 26.0–34.0)
MCHC: 33.4 g/dL (ref 30.0–36.0)
MCV: 93.4 fL (ref 80.0–100.0)
Monocytes Absolute: 0.2 10*3/uL (ref 0.1–1.0)
Monocytes Relative: 7 %
Neutro Abs: 1 10*3/uL — ABNORMAL LOW (ref 1.7–7.7)
Neutrophils Relative %: 32 %
Platelet Count: 349 10*3/uL (ref 150–400)
RBC: 3.49 MIL/uL — ABNORMAL LOW (ref 3.87–5.11)
RDW: 15.5 % (ref 11.5–15.5)
Smear Review: NORMAL
WBC Count: 3 10*3/uL — ABNORMAL LOW (ref 4.0–10.5)
nRBC: 0 % (ref 0.0–0.2)

## 2024-01-01 LAB — LACTATE DEHYDROGENASE: LDH: 236 U/L — ABNORMAL HIGH (ref 98–192)

## 2024-01-01 NOTE — Progress Notes (Unsigned)
 Patient continues to do well on her oral regimen. She will need a follow up PET. Scheduled for 01/31/2024.  Oncology Nurse Navigator Documentation     01/01/2024   10:30 AM  Oncology Nurse Navigator Flowsheets  Navigator Follow Up Date: 01/31/2024  Navigator Follow Up Reason: Scan Review  Navigator Location [facility]  Navigator Encounter Type Follow-up Appt  Patient Visit Type MedOnc  Treatment Phase Active Tx  Barriers/Navigation Needs No Barriers At This Time  Interventions Psycho-Social Support  Acuity Level 1-No Barriers  Support Groups/Services Friends and Family  Time Spent with Patient 15

## 2024-01-01 NOTE — Progress Notes (Signed)
 BP remains elevated, 173/77, she monitors at home and it is WMR, instructed to continue monitoring at home and if it is over 140/90 notify PCP. Verbalized understanding.

## 2024-01-01 NOTE — Progress Notes (Signed)
 Hematology and Oncology Follow Up Visit  Karina White [id number] June 27, 1956 68 y.o. 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     Interim History:  Karina White is back for follow-up.  She is doing okay.  She does have some anxiety whenever she comes to our office.  Karina White blood pressure has been on the high side.  She says that when she checks at home, Karina White blood pressure is okay.  Karina White last CA 27.29 was down to 118.  When we first started the CA 27.29 was 217.  She is doing fairly well with the Femara /ribociclib .  She is eating okay.  Karina White kidney function is down a little bit.  Karina White weight is up a little bit.  She may have little bit of water  on Karina White.  We increased Karina White hydrochlorothiazide  up to 50 mg p.o. daily.  She has had no bleeding.  She has had no rashes.  There is been no cough.  She had no increased shortness of breath.  Karina White last thoracentesis was back on 11/21/2023.  We never found malignant cells in the pleural fluid.  She has had no problems with mouth sores.  She has had no hot flashes.  Currently, I would have to say that Karina White performance status is probably ECOG 1.  .  .   Medications:  Current Outpatient Medications:    amLODipine  (NORVASC ) 10 MG tablet, Take 1 tablet (10 mg total) by mouth daily., Disp: 30 tablet, Rfl: 0   aspirin  EC 81 MG tablet, Take 81 mg by mouth daily. 01/01/2024 States takes every other day., Disp: , Rfl:    carvedilol  (COREG ) 3.125 MG tablet, Take 1 tablet (3.125 mg total) by mouth 2 (two) times daily with a meal., Disp: 60 tablet, Rfl: 6   hydrochlorothiazide  (HYDRODIURIL ) 25 MG tablet, Take 1 tablet (25 mg total) by mouth daily., Disp: 30 tablet, Rfl: 6    letrozole  (FEMARA ) 2.5 MG tablet, Take 1 tablet (2.5 mg total) by mouth daily., Disp: 30 tablet, Rfl: 6   Multiple Vitamins-Minerals (CENTRUM ADULTS PO), Take 1 tablet by mouth daily., Disp: , Rfl:    Omega 3 1200 MG CAPS, Take 1,200 mg by mouth daily., Disp: , Rfl:    omeprazole (PRILOSEC) 10 MG capsule, Take 10 mg by mouth daily., Disp: , Rfl:    potassium chloride  SA (KLOR-CON  M) 20 MEQ tablet, Take 1 tablet (20 mEq total) by mouth every other day., Disp: 30 tablet, Rfl: 1   ribociclib  succ (KISQALI , 600 MG DOSE,) 200 MG Therapy Pack, Take 3 tablets (600 mg total) by mouth daily. Take for 21 days on, 7 days off, repeat every 28 days., Disp: 63 tablet, Rfl: 0   telmisartan  (MICARDIS ) 40 MG tablet, Take 1 tablet (40 mg total) by mouth daily., Disp: 30 tablet, Rfl: 5   VITAMIN D, CHOLECALCIFEROL , PO, Take 2,000 Units by mouth daily., Disp: , Rfl:   Allergies:  Allergies  Allergen Reactions   Penicillins Hives and Itching   Codeine Nausea And Vomiting and Other (See Comments)   Penicillin G Other (See Comments)    Past Medical History, Surgical history, Social history, and Family History were reviewed and updated.  Review  of Systems: Review of Systems  Constitutional: Negative.  Negative for appetite change and unexpected weight change.  HENT:  Negative.    Eyes: Negative.   Respiratory:  Positive for shortness of breath.   Gastrointestinal: Negative.   Endocrine: Negative.   Genitourinary: Negative.    Musculoskeletal:  Positive for back pain.  Skin: Negative.   Neurological: Negative.   Hematological: Negative.   Psychiatric/Behavioral: Negative.      Physical Exam:  Vital signs show temperature of 97.9.  Pulse 71.  Blood pressure 184/75.  Weight is 189 pounds.     Wt Readings from Last 3 Encounters:  01/01/24 189 lb (85.7 kg)  11/19/23 179 lb (81.2 kg)  10/22/23 177 lb (80.3 kg)    Physical Exam Vitals reviewed.  Constitutional:      Comments: Karina White breast exam shows  right breast with no masses, edema or erythema.  There is no right axillary adenopathy.  Left breast does show some irregularity and some firmness at about the 12 o'clock position.  There is no tenderness.  There is no nipple discharge.  There is no left axillary adenopathy.  HENT:     Head: Normocephalic and atraumatic.  Eyes:     Pupils: Pupils are equal, round, and reactive to light.  Cardiovascular:     Rate and Rhythm: Normal rate and regular rhythm.     Heart sounds: Normal heart sounds.  Pulmonary:     Effort: Pulmonary effort is normal.     Breath sounds: Normal breath sounds.  Abdominal:     General: Bowel sounds are normal.     Palpations: Abdomen is soft.     Comments: Abdominal exam is soft.  She has decent bowel sounds.  There is no fluid wave.  There is no palpable liver or spleen tip.  Musculoskeletal:        General: No tenderness or deformity. Normal range of motion.     Cervical back: Normal range of motion.     Comments: Back exam shows some slight tenderness to palpation over the thoracic and lumbar spine.  Lymphadenopathy:     Cervical: No cervical adenopathy.  Skin:    General: Skin is warm and dry.     Findings: No erythema or rash.  Neurological:     Mental Status: She is alert and oriented to person, place, and time.  Psychiatric:        Behavior: Behavior normal.        Thought Content: Thought content normal.        Judgment: Judgment normal.     Lab Results  Component Value Date   WBC 3.0 (L) 01/01/2024   HGB 10.9 (L) 01/01/2024   HCT 32.6 (L) 01/01/2024   MCV 93.4 01/01/2024   PLT 349 01/01/2024     Chemistry      Component Value Date/Time   NA 141 01/01/2024 1011   K 3.9 01/01/2024 1011   CL 105 01/01/2024 1011   CO2 29 01/01/2024 1011   BUN 11 01/01/2024 1011   CREATININE 1.34 (H) 01/01/2024 1011      Component Value Date/Time   CALCIUM 9.1 01/01/2024 1011   ALKPHOS 71 01/01/2024 1011   AST 18 01/01/2024 1011   ALT 12 01/01/2024  1011   BILITOT 0.6 01/01/2024 1011      Impression and Plan: Karina White is a very nice 68 year old Afro-American female.  She has stage IV adenocarcinoma of the breast.  Thankfully, Karina White tumor is ER/PR positive.  It is low HER2 positive.  We currently have Karina White on antiestrogen therapy.   We will set up another PET scan.  I think this would certainly be helpful.  The fact that Karina White CA 27.29 is decreasing should certainly also be encouraging.  Of regimen is that we have been holding Karina White Xgeva  because of a tooth that need to be full.  We might be able to do Xgeva  in May or June.  I will have Karina White come back after she has the next PET scan.  This probably in about 6 weeks.  [name], MD 4/23/202510:55 AM

## 2024-01-02 ENCOUNTER — Encounter: Payer: Self-pay | Admitting: Hematology & Oncology

## 2024-01-02 ENCOUNTER — Telehealth: Payer: Self-pay

## 2024-01-02 LAB — CANCER ANTIGEN 27.29: CA 27.29: 83.1 U/mL — ABNORMAL HIGH (ref 0.0–38.6)

## 2024-01-02 NOTE — Telephone Encounter (Signed)
 Per Dr. Maria Shiner request pt called and made aware of the following: "her tumor marker is now down to 83."  Pt crying tears of joy and so excited to hear this news. Pt very appreciative of call and had no further questions.

## 2024-01-02 NOTE — Telephone Encounter (Signed)
-----   Message from [name] sent at 01/02/2024 10:34 AM EDT ----- Please call and let her know that the tumor marker is now down to 83.  Again, this is fantastic news.  God is great.  Karina White

## 2024-01-07 ENCOUNTER — Inpatient Hospital Stay: Payer: Medicare (Managed Care)

## 2024-01-07 ENCOUNTER — Telehealth: Payer: Self-pay

## 2024-01-07 NOTE — Progress Notes (Signed)
 [facility] CSW Progress Note  Clinical Child psychotherapist returned patient's phone call.  She inquired about the breast cancer grants that were submitted.  Discussed the process and she said she understood.  Encouraged her to review the grants for response time.  Kennth Peal, LCSW Clinical Social Worker [facility]

## 2024-01-17 ENCOUNTER — Other Ambulatory Visit (HOSPITAL_BASED_OUTPATIENT_CLINIC_OR_DEPARTMENT_OTHER): Payer: Self-pay

## 2024-01-20 ENCOUNTER — Other Ambulatory Visit (HOSPITAL_BASED_OUTPATIENT_CLINIC_OR_DEPARTMENT_OTHER): Payer: Self-pay

## 2024-01-28 ENCOUNTER — Inpatient Hospital Stay: Payer: Medicare (Managed Care) | Attending: Hematology & Oncology

## 2024-01-28 NOTE — Progress Notes (Signed)
 [facility] CSW Progress Note  Clinical Child psychotherapist returned patient's call.  She requested a bag of food from the [facility] food pantry.  Informed her to go to the cancer center and CSW will inform staff.  Patient stated she understood.  Her next appointment is 6/9 and she is currently experiencing food insecurity.    Kennth Peal, LCSW Clinical Social Worker [facility]

## 2024-01-30 ENCOUNTER — Other Ambulatory Visit (HOSPITAL_BASED_OUTPATIENT_CLINIC_OR_DEPARTMENT_OTHER): Payer: Self-pay

## 2024-01-31 ENCOUNTER — Encounter (HOSPITAL_COMMUNITY)
Admission: RE | Admit: 2024-01-31 | Discharge: 2024-01-31 | Disposition: A | Discharge status: Home / Self Care | Payer: Medicare (Managed Care) | Source: Ambulatory Visit | Attending: Hematology & Oncology | Admitting: Hematology & Oncology

## 2024-01-31 DIAGNOSIS — C7951 Secondary malignant neoplasm of bone: Secondary | ICD-10-CM | POA: Insufficient documentation

## 2024-01-31 DIAGNOSIS — C50919 Malignant neoplasm of unspecified site of unspecified female breast: Secondary | ICD-10-CM | POA: Insufficient documentation

## 2024-02-04 ENCOUNTER — Other Ambulatory Visit (HOSPITAL_BASED_OUTPATIENT_CLINIC_OR_DEPARTMENT_OTHER): Payer: Self-pay

## 2024-02-06 ENCOUNTER — Other Ambulatory Visit: Payer: Self-pay | Admitting: *Deleted

## 2024-02-06 DIAGNOSIS — C50919 Malignant neoplasm of unspecified site of unspecified female breast: Secondary | ICD-10-CM

## 2024-02-06 MED ORDER — RIBOCICLIB SUCC (600 MG DOSE) 200 MG PO TBPK: 600.0000 mg | ORAL_TABLET | Freq: Every day | ORAL | 0 refills | Status: DC

## 2024-02-10 ENCOUNTER — Ambulatory Visit (HOSPITAL_COMMUNITY)
Admission: RE | Admit: 2024-02-10 | Discharge: 2024-02-10 | Disposition: A | Discharge status: Home / Self Care | Payer: Medicare (Managed Care) | Source: Ambulatory Visit | Attending: Hematology & Oncology | Admitting: Hematology & Oncology

## 2024-02-10 DIAGNOSIS — R918 Other nonspecific abnormal finding of lung field: Secondary | ICD-10-CM | POA: Insufficient documentation

## 2024-02-10 DIAGNOSIS — K2289 Other specified disease of esophagus: Secondary | ICD-10-CM | POA: Insufficient documentation

## 2024-02-10 DIAGNOSIS — I700 Atherosclerosis of aorta: Secondary | ICD-10-CM | POA: Insufficient documentation

## 2024-02-10 DIAGNOSIS — J90 Pleural effusion, not elsewhere classified: Secondary | ICD-10-CM | POA: Diagnosis not present

## 2024-02-10 DIAGNOSIS — C7951 Secondary malignant neoplasm of bone: Secondary | ICD-10-CM | POA: Insufficient documentation

## 2024-02-10 DIAGNOSIS — C50919 Malignant neoplasm of unspecified site of unspecified female breast: Secondary | ICD-10-CM | POA: Insufficient documentation

## 2024-02-10 DIAGNOSIS — I2510 Atherosclerotic heart disease of native coronary artery without angina pectoris: Secondary | ICD-10-CM | POA: Diagnosis not present

## 2024-02-10 LAB — GLUCOSE, CAPILLARY: Glucose-Capillary: 91 mg/dL (ref 70–99)

## 2024-02-10 MED ORDER — FLUDEOXYGLUCOSE F - 18 (FDG) INJECTION
9.0000 | Freq: Once | INTRAVENOUS | Status: AC
  Administered 2024-02-10: 9.36 via INTRAVENOUS

## 2024-02-11 ENCOUNTER — Encounter: Payer: Self-pay | Admitting: *Deleted

## 2024-02-11 ENCOUNTER — Ambulatory Visit: Payer: Self-pay | Admitting: Hematology & Oncology

## 2024-02-11 NOTE — Telephone Encounter (Signed)
Called and informed patient of results, patient verbalized understanding and denies any questions or concerns at this time.  ? ?

## 2024-02-11 NOTE — Telephone Encounter (Signed)
-----   Message from [name] sent at 02/11/2024 12:21 PM EDT ----- Please call and let her know that the PET scan does not show any active cancer.  This is fantastic.  Once again God has taken care of everything for us .  Karina White

## 2024-02-11 NOTE — Progress Notes (Signed)
 Reviewed patient PET which shows excellent treatment response.   Oncology Nurse Navigator Documentation     02/11/2024    8:00 AM  Oncology Nurse Navigator Flowsheets  Navigator Follow Up Date: 02/17/2024  Navigator Follow Up Reason: Follow-up Appointment  Navigator Location [facility]  Navigator Encounter Type Scan Review  Patient Visit Type MedOnc  Treatment Phase Active Tx  Barriers/Navigation Needs No Barriers At This Time  Interventions None Required  Acuity Level 1-No Barriers  Support Groups/Services Friends and Family  Time Spent with Patient 15

## 2024-02-13 ENCOUNTER — Inpatient Hospital Stay: Payer: Medicare (Managed Care) | Attending: Hematology & Oncology

## 2024-02-13 DIAGNOSIS — C7951 Secondary malignant neoplasm of bone: Secondary | ICD-10-CM | POA: Insufficient documentation

## 2024-02-13 DIAGNOSIS — R600 Localized edema: Secondary | ICD-10-CM | POA: Insufficient documentation

## 2024-02-13 DIAGNOSIS — C782 Secondary malignant neoplasm of pleura: Secondary | ICD-10-CM | POA: Insufficient documentation

## 2024-02-13 DIAGNOSIS — C50919 Malignant neoplasm of unspecified site of unspecified female breast: Secondary | ICD-10-CM | POA: Insufficient documentation

## 2024-02-13 DIAGNOSIS — C50412 Malignant neoplasm of upper-outer quadrant of left female breast: Secondary | ICD-10-CM | POA: Insufficient documentation

## 2024-02-13 DIAGNOSIS — J90 Pleural effusion, not elsewhere classified: Secondary | ICD-10-CM | POA: Insufficient documentation

## 2024-02-13 DIAGNOSIS — Z1731 Human epidermal growth factor receptor 2 positive status: Secondary | ICD-10-CM | POA: Insufficient documentation

## 2024-02-13 DIAGNOSIS — Z170 Estrogen receptor positive status [ER+]: Secondary | ICD-10-CM | POA: Insufficient documentation

## 2024-02-13 DIAGNOSIS — Z1721 Progesterone receptor positive status: Secondary | ICD-10-CM | POA: Insufficient documentation

## 2024-02-13 DIAGNOSIS — Z79811 Long term (current) use of aromatase inhibitors: Secondary | ICD-10-CM | POA: Insufficient documentation

## 2024-02-13 NOTE — Progress Notes (Signed)
 [facility] CSW Progress Note  Visual merchandiser received a call from patient inquiring about grants.  She has not applied for food stamps and does not qualify for the [employer] at this time.  She requested assistance with the [facility] program for the elderly.  Patient to meet with CSW on Wednesday, 6/11, at 11am.  Patient also confirmed that she received assistance from [employer].    Kennth Peal, LCSW Clinical Social Worker [facility]

## 2024-02-14 ENCOUNTER — Other Ambulatory Visit (HOSPITAL_BASED_OUTPATIENT_CLINIC_OR_DEPARTMENT_OTHER): Payer: Self-pay

## 2024-02-17 ENCOUNTER — Other Ambulatory Visit (HOSPITAL_BASED_OUTPATIENT_CLINIC_OR_DEPARTMENT_OTHER): Payer: Self-pay

## 2024-02-17 ENCOUNTER — Inpatient Hospital Stay: Payer: Medicare (Managed Care)

## 2024-02-17 ENCOUNTER — Inpatient Hospital Stay (HOSPITAL_BASED_OUTPATIENT_CLINIC_OR_DEPARTMENT_OTHER): Payer: Medicare (Managed Care) | Admitting: Hematology & Oncology

## 2024-02-17 ENCOUNTER — Encounter: Payer: Self-pay | Admitting: Hematology & Oncology

## 2024-02-17 VITALS — BP 148/65 | HR 56 | Temp 97.6°F | Resp 20 | Ht 67.0 in | Wt 196.0 lb

## 2024-02-17 DIAGNOSIS — C7951 Secondary malignant neoplasm of bone: Secondary | ICD-10-CM | POA: Diagnosis not present

## 2024-02-17 DIAGNOSIS — C50919 Malignant neoplasm of unspecified site of unspecified female breast: Secondary | ICD-10-CM | POA: Diagnosis not present

## 2024-02-17 DIAGNOSIS — Z170 Estrogen receptor positive status [ER+]: Secondary | ICD-10-CM | POA: Diagnosis not present

## 2024-02-17 DIAGNOSIS — C782 Secondary malignant neoplasm of pleura: Secondary | ICD-10-CM | POA: Diagnosis not present

## 2024-02-17 DIAGNOSIS — R600 Localized edema: Secondary | ICD-10-CM | POA: Diagnosis not present

## 2024-02-17 DIAGNOSIS — J90 Pleural effusion, not elsewhere classified: Secondary | ICD-10-CM | POA: Diagnosis not present

## 2024-02-17 DIAGNOSIS — C50911 Malignant neoplasm of unspecified site of right female breast: Secondary | ICD-10-CM

## 2024-02-17 DIAGNOSIS — Z1721 Progesterone receptor positive status: Secondary | ICD-10-CM | POA: Diagnosis not present

## 2024-02-17 DIAGNOSIS — Z79811 Long term (current) use of aromatase inhibitors: Secondary | ICD-10-CM | POA: Diagnosis not present

## 2024-02-17 DIAGNOSIS — C50412 Malignant neoplasm of upper-outer quadrant of left female breast: Secondary | ICD-10-CM | POA: Diagnosis present

## 2024-02-17 DIAGNOSIS — Z1731 Human epidermal growth factor receptor 2 positive status: Secondary | ICD-10-CM | POA: Diagnosis not present

## 2024-02-17 LAB — CBC WITH DIFFERENTIAL (CANCER CENTER ONLY)
Abs Immature Granulocytes: 0 10*3/uL (ref 0.00–0.07)
Basophils Absolute: 0.1 10*3/uL (ref 0.0–0.1)
Basophils Relative: 1 %
Eosinophils Absolute: 0.1 10*3/uL (ref 0.0–0.5)
Eosinophils Relative: 2 %
HCT: 32 % — ABNORMAL LOW (ref 36.0–46.0)
Hemoglobin: 11 g/dL — ABNORMAL LOW (ref 12.0–15.0)
Immature Granulocytes: 0 %
Lymphocytes Relative: 53 %
Lymphs Abs: 2.2 10*3/uL (ref 0.7–4.0)
MCH: 32.1 pg (ref 26.0–34.0)
MCHC: 34.4 g/dL (ref 30.0–36.0)
MCV: 93.3 fL (ref 80.0–100.0)
Monocytes Absolute: 0.5 10*3/uL (ref 0.1–1.0)
Monocytes Relative: 13 %
Neutro Abs: 1.2 10*3/uL — ABNORMAL LOW (ref 1.7–7.7)
Neutrophils Relative %: 31 %
Platelet Count: 158 10*3/uL (ref 150–400)
RBC: 3.43 MIL/uL — ABNORMAL LOW (ref 3.87–5.11)
RDW: 14.4 % (ref 11.5–15.5)
WBC Count: 4 10*3/uL (ref 4.0–10.5)
nRBC: 0 % (ref 0.0–0.2)

## 2024-02-17 LAB — CMP (CANCER CENTER ONLY)
ALT: 17 U/L (ref 0–44)
AST: 24 U/L (ref 15–41)
Albumin: 4.5 g/dL (ref 3.5–5.0)
Alkaline Phosphatase: 65 U/L (ref 38–126)
Anion gap: 7 (ref 5–15)
BUN: 14 mg/dL (ref 8–23)
CO2: 28 mmol/L (ref 22–32)
Calcium: 9.3 mg/dL (ref 8.9–10.3)
Chloride: 106 mmol/L (ref 98–111)
Creatinine: 1.41 mg/dL — ABNORMAL HIGH (ref 0.44–1.00)
GFR, Estimated: 40 mL/min — ABNORMAL LOW (ref >60)
Glucose, Bld: 95 mg/dL (ref 70–99)
Potassium: 4.4 mmol/L (ref 3.5–5.1)
Sodium: 141 mmol/L (ref 135–145)
Total Bilirubin: 0.4 mg/dL (ref 0.0–1.2)
Total Protein: 7.3 g/dL (ref 6.5–8.1)

## 2024-02-17 NOTE — Progress Notes (Signed)
 BP 155/68, patient states BP was 121/71. Has taken all BP meds this morning. Second BP, 148/65. Instructed to continue to monitor at home and notify PCP if it remains over 140/90. Verbalized understanding.

## 2024-02-17 NOTE — Progress Notes (Signed)
 Hematology and Oncology Follow Up Visit  Karina White [id number] 12/28/1955 68 y.o. 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  Tooth extraction on 02/20/2024     Interim History:  Karina White is back for follow-up.  Thankfully, she continues to do quite well with the spread to the breast cancer.  She had a PET scan that was done on 01/31/2024.  This did not show any evidence of active breast cancer.  She still had this moderate right pleural effusion.  She has had this drained on several occasions.  There is never any malignancy noted.  There were problem that she has that her kidneys are showing some weakness.  Her creatinine has been going up slowly.  I do not know if this might be from the ribociclib .  I know there was some case reports of ribociclib  causing renal insufficiency.  I think for right now, we just need to keep her on the ribociclib .  I would have her check in 1 month..  I told her to  stop the hydrochlorothiazide .  This also might be causing some issues..  She wants to have the tooth extracted this week.  As such, we will going to have to hold her Xgeva  through the Summer.  Her last CA 27.29 in April was 35.  She has had a good appetite.  She has had no nausea or vomiting.  She has had no bleeding.  There is been no diarrhea.  She has had no cough.  There has been no increased shortness of breath.  Overall, I will have to say that her performance status is ECOG 1.   .   Medications:  Current Outpatient Medications:    amLODipine  (NORVASC ) 10 MG tablet, Take 1 tablet (10 mg total) by mouth daily., Disp: 30 tablet, Rfl: 0   aspirin  EC 81 MG tablet, Take 81 mg by mouth daily. 01/01/2024  States takes every other day., Disp: , Rfl:    carvedilol  (COREG ) 3.125 MG tablet, Take 1 tablet (3.125 mg total) by mouth 2 (two) times daily with a meal., Disp: 60 tablet, Rfl: 6   hydrochlorothiazide  (HYDRODIURIL ) 25 MG tablet, Take 1 tablet (25 mg total) by mouth daily., Disp: 30 tablet, Rfl: 6   letrozole  (FEMARA ) 2.5 MG tablet, Take 1 tablet (2.5 mg total) by mouth daily., Disp: 30 tablet, Rfl: 6   Multiple Vitamins-Minerals (CENTRUM ADULTS PO), Take 1 tablet by mouth daily., Disp: , Rfl:    Omega 3 1200 MG CAPS, Take 1,200 mg by mouth daily., Disp: , Rfl:    omeprazole (PRILOSEC) 10 MG capsule, Take 10 mg by mouth daily., Disp: , Rfl:    potassium chloride  SA (KLOR-CON  M) 20 MEQ tablet, Take 1 tablet (20 mEq total) by mouth every other day., Disp: 30 tablet, Rfl: 1   ribociclib  succ (KISQALI , 600 MG DOSE,) 200 MG Therapy Pack, Take 3 tablets (600 mg total) by mouth daily. Take for 21 days on, 7 days off, repeat every 28 days., Disp: 63 tablet, Rfl: 0   telmisartan  (MICARDIS ) 40 MG tablet, Take 1 tablet (40 mg total) by mouth daily., Disp: 30 tablet, Rfl: 5   VITAMIN D, CHOLECALCIFEROL , PO, Take 2,000  Units by mouth daily., Disp: , Rfl:   Allergies:  Allergies  Allergen Reactions   Penicillins Hives and Itching   Codeine Nausea And Vomiting and Other (See Comments)   Penicillin G Other (See Comments)    Past Medical History, Surgical history, Social history, and Family History were reviewed and updated.  Review of Systems: Review of Systems  Constitutional: Negative.  Negative for appetite change and unexpected weight change.  HENT:  Negative.    Eyes: Negative.   Respiratory:  Positive for shortness of breath.   Gastrointestinal: Negative.   Endocrine: Negative.   Genitourinary: Negative.    Musculoskeletal:  Positive for back pain.  Skin: Negative.   Neurological: Negative.   Hematological: Negative.   Psychiatric/Behavioral: Negative.      Physical Exam:  Vital signs  show temperature of 97.6.  Pulse 56.  Blood pressure 148/65.  Weight is 196 pounds.    Wt Readings from Last 3 Encounters:  02/17/24 196 lb (88.9 kg)  01/01/24 189 lb (85.7 kg)  11/19/23 179 lb (81.2 kg)    Physical Exam Vitals reviewed.  Constitutional:      Comments: Her breast exam shows right breast with no masses, edema or erythema.  There is no right axillary adenopathy.  Left breast does show some irregularity and some firmness at about the 12 o'clock position.  There is no tenderness.  There is no nipple discharge.  There is no left axillary adenopathy.  HENT:     Head: Normocephalic and atraumatic.  Eyes:     Pupils: Pupils are equal, round, and reactive to light.  Cardiovascular:     Rate and Rhythm: Normal rate and regular rhythm.     Heart sounds: Normal heart sounds.  Pulmonary:     Effort: Pulmonary effort is normal.     Breath sounds: Normal breath sounds.  Abdominal:     General: Bowel sounds are normal.     Palpations: Abdomen is soft.     Comments: Abdominal exam is soft.  She has decent bowel sounds.  There is no fluid wave.  There is no palpable liver or spleen tip.  Musculoskeletal:        General: No tenderness or deformity. Normal range of motion.     Cervical back: Normal range of motion.     Comments: Back exam shows some slight tenderness to palpation over the thoracic and lumbar spine.  Lymphadenopathy:     Cervical: No cervical adenopathy.  Skin:    General: Skin is warm and dry.     Findings: No erythema or rash.  Neurological:     Mental Status: She is alert and oriented to person, place, and time.  Psychiatric:        Behavior: Behavior normal.        Thought Content: Thought content normal.        Judgment: Judgment normal.     Lab Results  Component Value Date   WBC 4.0 02/17/2024   HGB 11.0 (L) 02/17/2024   HCT 32.0 (L) 02/17/2024   MCV 93.3 02/17/2024   PLT 158 02/17/2024     Chemistry      Component Value Date/Time   NA 141  02/17/2024 1159   K 4.4 02/17/2024 1159   CL 106 02/17/2024 1159   CO2 28 02/17/2024 1159   BUN 14 02/17/2024 1159   CREATININE 1.41 (H) 02/17/2024 1159      Component Value Date/Time   CALCIUM 9.3 02/17/2024 1159  ALKPHOS 65 02/17/2024 1159   AST 24 02/17/2024 1159   ALT 17 02/17/2024 1159   BILITOT 0.4 02/17/2024 1159      Impression and Plan: Karina White is a very nice 68 year old Afro-American female.  She has stage IV adenocarcinoma of the breast.  Thankfully, her tumor is ER/PR positive.  It is low HER2 positive.  We currently have her on antiestrogen therapy.  I think that the antiestrogen therapy is doing a great job.  I would hate to make any changes with this for right now.  Again we want to recheck her renal function about a month.  We will see what happens when she stops the hydrochlorothiazide .  I think some of the edema in the legs might be from her amlodipine .  I know she is quite worried over issues with her health.  We tried to reassure her as much as possible.   [name], MD 6/9/20251:09 PM

## 2024-02-18 ENCOUNTER — Encounter: Payer: Self-pay | Admitting: *Deleted

## 2024-02-18 ENCOUNTER — Inpatient Hospital Stay: Payer: Medicare (Managed Care)

## 2024-02-18 LAB — CANCER ANTIGEN 27.29: CA 27.29: 62 U/mL — ABNORMAL HIGH (ref 0.0–38.6)

## 2024-02-18 NOTE — Progress Notes (Signed)
 [facility] CSW Progress Note  Clinical Child psychotherapist received call from patient inquiring about assistance from [employer].  Explored patient options and encouraged her to do the following per their website.  Pension scheme manager and [employer] in [employer] for [employer].  CSW provided her with contact information and she said she understood.    Kennth Peal, LCSW Clinical Social Worker [facility]

## 2024-02-18 NOTE — Telephone Encounter (Signed)
-----   Message from [name] sent at 02/18/2024 10:01 AM EDT ----- Call and please tell her that the tumor marker is now down to 62.  Outstanding work.  As always, God is great.  Twilla Galea

## 2024-02-18 NOTE — Progress Notes (Signed)
 Patient continues on Kisqali  and doing well. Last scan showed no active malignancy. Patient has not had navigational needs for some time, therefor will discontinue active navigation at this time, but be available to patient as needed.   Oncology Nurse Navigator Documentation     02/18/2024    9:45 AM  Oncology Nurse Navigator Flowsheets  Navigation Complete Date: 02/18/2024  Post Navigation: Continue to Follow Patient? No  Reason Not Navigating Patient: Patient On Maintenance Chemotherapy  Navigator Location [facility]  Navigator Encounter Type Appt/Treatment Plan Review  Patient Visit Type MedOnc  Treatment Phase Active Tx  Barriers/Navigation Needs No Barriers At This Time  Interventions None Required  Acuity Level 1-No Barriers  Support Groups/Services Friends and Family  Time Spent with Patient 15

## 2024-02-18 NOTE — Telephone Encounter (Signed)
 As noted below by Dr. Maria Shiner, I informed her that the tumor marker is down to 62. She verbalized understanding.

## 2024-02-19 ENCOUNTER — Inpatient Hospital Stay: Payer: Medicare (Managed Care) | Admitting: Licensed Clinical Social Worker

## 2024-02-19 ENCOUNTER — Ambulatory Visit (HOSPITAL_COMMUNITY)
Admission: RE | Admit: 2024-02-19 | Discharge: 2024-02-19 | Disposition: A | Discharge status: Home / Self Care | Payer: Medicare (Managed Care) | Source: Ambulatory Visit | Attending: Hematology & Oncology | Admitting: Hematology & Oncology

## 2024-02-19 ENCOUNTER — Ambulatory Visit (HOSPITAL_COMMUNITY)
Admission: RE | Admit: 2024-02-19 | Discharge: 2024-02-19 | Disposition: A | Discharge status: Home / Self Care | Payer: Medicare (Managed Care) | Source: Ambulatory Visit | Attending: Student | Admitting: Student

## 2024-02-19 DIAGNOSIS — J90 Pleural effusion, not elsewhere classified: Secondary | ICD-10-CM | POA: Insufficient documentation

## 2024-02-19 HISTORY — PX: IR THORACENTESIS ASP PLEURAL SPACE W/IMG GUIDE: IMG5380

## 2024-02-19 MED ORDER — LIDOCAINE HCL 1 % IJ SOLN: 20.0000 mL | Freq: Once | INTRAMUSCULAR | Status: DC

## 2024-02-19 MED ORDER — LIDOCAINE HCL 1 % IJ SOLN
INTRAMUSCULAR | Status: AC
  Filled 2024-02-19: qty 20

## 2024-02-19 NOTE — Procedures (Signed)
 PROCEDURE SUMMARY:  Successful US  guided right thoracentesis. Yielded 800 mL of yellow fluid. Pt tolerated procedure well. No immediate complications.  Specimen was not sent for labs. CXR ordered.  EBL < 5 mL  Lillian Rein PA-C 02/19/2024 2:25 PM

## 2024-02-20 ENCOUNTER — Other Ambulatory Visit (HOSPITAL_BASED_OUTPATIENT_CLINIC_OR_DEPARTMENT_OTHER): Payer: Self-pay

## 2024-02-20 MED ORDER — TRAMADOL HCL 50 MG PO TABS
50.0000 mg | ORAL_TABLET | ORAL | 0 refills | Status: DC | PRN
  Filled 2024-02-20: qty 6, 1d supply, fill #0

## 2024-02-20 MED ORDER — CHLORHEXIDINE GLUCONATE 0.12 % MT SOLN
15.0000 mL | Freq: Two times a day (BID) | OROMUCOSAL | 0 refills | Status: DC
  Filled 2024-02-20: qty 473, 16d supply, fill #0

## 2024-02-21 ENCOUNTER — Other Ambulatory Visit (HOSPITAL_BASED_OUTPATIENT_CLINIC_OR_DEPARTMENT_OTHER): Payer: Self-pay

## 2024-02-21 ENCOUNTER — Other Ambulatory Visit (HOSPITAL_COMMUNITY): Payer: Medicare (Managed Care)

## 2024-02-27 ENCOUNTER — Other Ambulatory Visit: Payer: Self-pay

## 2024-02-27 DIAGNOSIS — C50919 Malignant neoplasm of unspecified site of unspecified female breast: Secondary | ICD-10-CM

## 2024-02-27 MED ORDER — RIBOCICLIB SUCC (600 MG DOSE) 200 MG PO TBPK: 600.0000 mg | ORAL_TABLET | Freq: Every day | ORAL | 0 refills | Status: DC

## 2024-02-27 NOTE — Telephone Encounter (Signed)
 Received fax from [facility] requesting a refill on Kisqali  200 mg #63. Last refilled 02/06/24.

## 2024-02-28 ENCOUNTER — Other Ambulatory Visit: Payer: Self-pay

## 2024-02-28 ENCOUNTER — Other Ambulatory Visit (HOSPITAL_BASED_OUTPATIENT_CLINIC_OR_DEPARTMENT_OTHER): Payer: Self-pay

## 2024-02-28 ENCOUNTER — Other Ambulatory Visit: Payer: Self-pay | Admitting: Hematology & Oncology

## 2024-02-28 MED ORDER — LETROZOLE 2.5 MG PO TABS
2.5000 mg | ORAL_TABLET | Freq: Every day | ORAL | 6 refills | Status: DC
  Filled 2024-02-28: qty 30, 30d supply, fill #0
  Filled 2024-04-01: qty 30, 30d supply, fill #1
  Filled 2024-04-30: qty 30, 30d supply, fill #2
  Filled 2024-06-01: qty 30, 30d supply, fill #3
  Filled 2024-06-22 – 2024-06-24 (×2): qty 30, 30d supply, fill #4
  Filled 2024-07-27: qty 30, 30d supply, fill #5
  Filled 2024-08-29: qty 30, 30d supply, fill #6

## 2024-03-03 ENCOUNTER — Other Ambulatory Visit: Payer: Self-pay

## 2024-03-12 ENCOUNTER — Other Ambulatory Visit (HOSPITAL_BASED_OUTPATIENT_CLINIC_OR_DEPARTMENT_OTHER): Payer: Self-pay

## 2024-03-12 ENCOUNTER — Inpatient Hospital Stay: Payer: Medicare (Managed Care)

## 2024-03-12 ENCOUNTER — Other Ambulatory Visit: Payer: Self-pay | Admitting: *Deleted

## 2024-03-12 ENCOUNTER — Encounter: Payer: Self-pay | Admitting: Hematology & Oncology

## 2024-03-12 ENCOUNTER — Telehealth: Payer: Self-pay

## 2024-03-12 ENCOUNTER — Inpatient Hospital Stay (HOSPITAL_BASED_OUTPATIENT_CLINIC_OR_DEPARTMENT_OTHER): Payer: Medicare (Managed Care) | Admitting: Hematology & Oncology

## 2024-03-12 ENCOUNTER — Inpatient Hospital Stay: Payer: Medicare (Managed Care) | Attending: Hematology & Oncology

## 2024-03-12 VITALS — BP 185/85 | HR 56 | Temp 98.4°F | Resp 20 | Ht 67.0 in | Wt 194.8 lb

## 2024-03-12 DIAGNOSIS — Z79811 Long term (current) use of aromatase inhibitors: Secondary | ICD-10-CM | POA: Diagnosis not present

## 2024-03-12 DIAGNOSIS — Z170 Estrogen receptor positive status [ER+]: Secondary | ICD-10-CM | POA: Insufficient documentation

## 2024-03-12 DIAGNOSIS — C7951 Secondary malignant neoplasm of bone: Secondary | ICD-10-CM

## 2024-03-12 DIAGNOSIS — C50919 Malignant neoplasm of unspecified site of unspecified female breast: Secondary | ICD-10-CM | POA: Insufficient documentation

## 2024-03-12 DIAGNOSIS — Z1731 Human epidermal growth factor receptor 2 positive status: Secondary | ICD-10-CM | POA: Diagnosis not present

## 2024-03-12 DIAGNOSIS — C782 Secondary malignant neoplasm of pleura: Secondary | ICD-10-CM | POA: Diagnosis not present

## 2024-03-12 DIAGNOSIS — Z1721 Progesterone receptor positive status: Secondary | ICD-10-CM | POA: Diagnosis not present

## 2024-03-12 LAB — CMP (CANCER CENTER ONLY)
ALT: 10 U/L (ref 0–44)
AST: 18 U/L (ref 15–41)
Albumin: 4.2 g/dL (ref 3.5–5.0)
Alkaline Phosphatase: 70 U/L (ref 38–126)
Anion gap: 7 (ref 5–15)
BUN: 9 mg/dL (ref 8–23)
CO2: 30 mmol/L (ref 22–32)
Calcium: 9.2 mg/dL (ref 8.9–10.3)
Chloride: 106 mmol/L (ref 98–111)
Creatinine: 1.24 mg/dL — ABNORMAL HIGH (ref 0.44–1.00)
GFR, Estimated: 47 mL/min — ABNORMAL LOW (ref >60)
Glucose, Bld: 94 mg/dL (ref 70–99)
Potassium: 3.7 mmol/L (ref 3.5–5.1)
Sodium: 143 mmol/L (ref 135–145)
Total Bilirubin: 0.5 mg/dL (ref 0.0–1.2)
Total Protein: 7 g/dL (ref 6.5–8.1)

## 2024-03-12 LAB — CBC WITH DIFFERENTIAL (CANCER CENTER ONLY)
Abs Immature Granulocytes: 0.01 10*3/uL (ref 0.00–0.07)
Basophils Absolute: 0 10*3/uL (ref 0.0–0.1)
Basophils Relative: 2 %
Eosinophils Absolute: 0.1 10*3/uL (ref 0.0–0.5)
Eosinophils Relative: 2 %
HCT: 32 % — ABNORMAL LOW (ref 36.0–46.0)
Hemoglobin: 10.7 g/dL — ABNORMAL LOW (ref 12.0–15.0)
Immature Granulocytes: 0 %
Lymphocytes Relative: 56 %
Lymphs Abs: 1.4 10*3/uL (ref 0.7–4.0)
MCH: 31.6 pg (ref 26.0–34.0)
MCHC: 33.4 g/dL (ref 30.0–36.0)
MCV: 94.4 fL (ref 80.0–100.0)
Monocytes Absolute: 0.3 10*3/uL (ref 0.1–1.0)
Monocytes Relative: 12 %
Neutro Abs: 0.7 10*3/uL — ABNORMAL LOW (ref 1.7–7.7)
Neutrophils Relative %: 28 %
Platelet Count: 166 10*3/uL (ref 150–400)
RBC: 3.39 MIL/uL — ABNORMAL LOW (ref 3.87–5.11)
RDW: 14.4 % (ref 11.5–15.5)
WBC Count: 2.4 10*3/uL — ABNORMAL LOW (ref 4.0–10.5)
nRBC: 0 % (ref 0.0–0.2)

## 2024-03-12 LAB — LACTATE DEHYDROGENASE: LDH: 238 U/L — ABNORMAL HIGH (ref 98–192)

## 2024-03-12 MED ORDER — HYDROCHLOROTHIAZIDE 25 MG PO TABS
25.0000 mg | ORAL_TABLET | Freq: Every day | ORAL | 6 refills | Status: AC
  Filled 2024-03-12: qty 30, 30d supply, fill #0
  Filled 2024-07-13 (×2): qty 30, 30d supply, fill #1

## 2024-03-12 MED ORDER — POTASSIUM CHLORIDE CRYS ER 20 MEQ PO TBCR
20.0000 meq | EXTENDED_RELEASE_TABLET | ORAL | 6 refills | Status: AC
  Filled 2024-03-12: qty 30, 60d supply, fill #0
  Filled 2024-05-13: qty 30, 60d supply, fill #1
  Filled 2024-09-18: qty 30, 60d supply, fill #2

## 2024-03-12 MED ORDER — RIBOCICLIB SUCC (600 MG DOSE) 200 MG PO TBPK: 600.0000 mg | ORAL_TABLET | Freq: Every day | ORAL | 3 refills | Status: DC

## 2024-03-12 NOTE — Progress Notes (Signed)
 [facility] CSW Progress Note  Clinical Child psychotherapist contacted patient by phone to follow-up on financial concerns.    Interventions: Provided patient with information about the [employer].  Patient does not receive government assistance and does not qualify at this time.  She wishes to meet with CSW to apply for food stamps.       Follow Up Plan:  CSW will see patient on Wednesday, 7/9, at 8am.    Macario CHRISTELLA Au, LCSW Clinical Social Worker [facility]

## 2024-03-12 NOTE — Progress Notes (Signed)
 Hematology and Oncology Follow Up Visit  Karina White [id number] 25-Apr-1956 68 y.o. 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  Tooth extraction on 02/20/2024     Interim History:  Karina White is back for follow-up.  She finally had dental work done.  She finally had tooth extraction.  Now, she is to wait until she can have dentures put in.  I am not sure how long this will take.  As always, she had no other PET scan done.  This was done on 01/31/2024.  Again, this did not show any evidence of metastatic disease..  She had another thoracentesis.  She has recurrence of a left pleural effusion.  Thankfully, the cytology has was been negative for any malignancy.  Of note, her last CA 27.29 was down to 62.  She has had no problems with nausea or vomiting.  She has had no diarrhea.  She has had no rashes.  She has had a little bit of leg swelling.  I will get her back on her hydrochlorothiazide .  Peripheral blood pressure has been on the high side.  I think the hydrochlorothiazide  will help with this.  She has had no issues with fever.  There has been no bleeding.  Overall, I would say that her performance status is probably ECOG 1.  .   .   Medications:  Current Outpatient Medications:    amLODipine  (NORVASC ) 10 MG tablet, Take 1 tablet (10 mg total) by mouth daily., Disp: 30 tablet, Rfl: 0   aspirin  EC 81 MG tablet, Take 81 mg by mouth daily. 01/01/2024 States takes every other day., Disp: , Rfl:    carvedilol  (COREG ) 3.125 MG tablet, Take 1 tablet (3.125 mg total) by mouth 2 (two) times daily with a meal., Disp: 60 tablet, Rfl: 6   letrozole  (FEMARA ) 2.5 MG tablet, Take 1 tablet (2.5 mg total) by mouth  daily., Disp: 30 tablet, Rfl: 6   Multiple Vitamins-Minerals (CENTRUM ADULTS PO), Take 1 tablet by mouth daily., Disp: , Rfl:    Omega 3 1200 MG CAPS, Take 1,200 mg by mouth daily., Disp: , Rfl:    omeprazole (PRILOSEC) 10 MG capsule, Take 10 mg by mouth daily., Disp: , Rfl:    potassium chloride  SA (KLOR-CON  M) 20 MEQ tablet, Take 1 tablet (20 mEq total) by mouth every other day., Disp: 30 tablet, Rfl: 1   ribociclib  succ (KISQALI , 600 MG DOSE,) 200 MG Therapy Pack, Take 3 tablets (600 mg total) by mouth daily. Take for 21 days on, 7 days off, repeat every 28 days., Disp: 63 tablet, Rfl: 0   telmisartan  (MICARDIS ) 40 MG tablet, Take 1 tablet (40 mg total) by mouth daily., Disp: 30 tablet, Rfl: 5   VITAMIN D, CHOLECALCIFEROL , PO, Take 2,000 Units by mouth daily., Disp: , Rfl:    chlorhexidine  (PERIDEX ) 0.12 % solution, Rinse mouth with 15 mLs (1 capful) 2 (two) times daily after brushing teeth. Swish for 30 seconds then spit out., Disp: 473 mL, Rfl: 0   hydrochlorothiazide  (HYDRODIURIL ) 25 MG tablet, Take 1 tablet (25 mg total) by mouth daily. (Patient not taking: Reported on 03/12/2024), Disp: 30 tablet, Rfl: 6   traMADol  (ULTRAM ) 50  MG tablet, Take 1 tablet (50 mg total) by mouth every 4 (four) to 6 (six) hours as needed., Disp: 6 tablet, Rfl: 0  Allergies:  Allergies  Allergen Reactions   Penicillins Hives and Itching   Codeine Nausea And Vomiting and Other (See Comments)   Penicillin G Other (See Comments)    Past Medical History, Surgical history, Social history, and Family History were reviewed and updated.  Review of Systems: Review of Systems  Constitutional: Negative.  Negative for appetite change and unexpected weight change.  HENT:  Negative.    Eyes: Negative.   Respiratory:  Positive for shortness of breath.   Gastrointestinal: Negative.   Endocrine: Negative.   Genitourinary: Negative.    Musculoskeletal:  Positive for back pain.  Skin: Negative.   Neurological: Negative.    Hematological: Negative.   Psychiatric/Behavioral: Negative.      Physical Exam:  Vital signs show temperature of 98.4.  Pulse 56.  Blood pressure 185/85.  Weight is 194 pounds.     Wt Readings from Last 3 Encounters:  03/12/24 194 lb 12.8 oz (88.4 kg)  02/17/24 196 lb (88.9 kg)  01/01/24 189 lb (85.7 kg)    Physical Exam Vitals reviewed.  Constitutional:      Comments: Her breast exam shows right breast with no masses, edema or erythema.  There is no right axillary adenopathy.  Left breast does show some irregularity and some firmness at about the 12 o'clock position.  There is no tenderness.  There is no nipple discharge.  There is no left axillary adenopathy.  HENT:     Head: Normocephalic and atraumatic.  Eyes:     Pupils: Pupils are equal, round, and reactive to light.  Cardiovascular:     Rate and Rhythm: Normal rate and regular rhythm.     Heart sounds: Normal heart sounds.  Pulmonary:     Effort: Pulmonary effort is normal.     Breath sounds: Normal breath sounds.  Abdominal:     General: Bowel sounds are normal.     Palpations: Abdomen is soft.     Comments: Abdominal exam is soft.  She has decent bowel sounds.  There is no fluid wave.  There is no palpable liver or spleen tip.  Musculoskeletal:        General: No tenderness or deformity. Normal range of motion.     Cervical back: Normal range of motion.     Comments: Back exam shows some slight tenderness to palpation over the thoracic and lumbar spine.  Lymphadenopathy:     Cervical: No cervical adenopathy.  Skin:    General: Skin is warm and dry.     Findings: No erythema or rash.  Neurological:     Mental Status: She is alert and oriented to person, place, and time.  Psychiatric:        Behavior: Behavior normal.        Thought Content: Thought content normal.        Judgment: Judgment normal.      Lab Results  Component Value Date   WBC 2.4 (L) 03/12/2024   HGB 10.7 (L) 03/12/2024   HCT 32.0  (L) 03/12/2024   MCV 94.4 03/12/2024   PLT 166 03/12/2024     Chemistry      Component Value Date/Time   NA 143 03/12/2024 0823   K 3.7 03/12/2024 0823   CL 106 03/12/2024 0823   CO2 30 03/12/2024 0823   BUN 9 03/12/2024 0823  CREATININE 1.24 (H) 03/12/2024 0823      Component Value Date/Time   CALCIUM 9.2 03/12/2024 0823   ALKPHOS 70 03/12/2024 0823   AST 18 03/12/2024 0823   ALT 10 03/12/2024 0823   BILITOT 0.5 03/12/2024 0823      Impression and Plan: Karina White is a very nice 68 year old Afro-American female.  She has stage IV adenocarcinoma of the breast.  Thankfully, her tumor is ER/PR positive.  It is low HER2 positive.  We currently have her on antiestrogen therapy.  I think that the antiestrogen therapy is doing a great job.  I would hate to make any changes with this for right now.  I am glad that everything is going well for her.  Again, I think we can have to get her back onto the hydrochlorothiazide .  Renal function is low but better which is nice to see.  She is on amlodipine  so I think this probably is a major source of the swelling.  I will plan to have her come back to see us  in another 6 weeks or so.  We will probably set up another PET scan for her sometime in August.   Maude JONELLE Crease, MD 7/3/20259:10 AM

## 2024-03-12 NOTE — Progress Notes (Signed)
 BP remains elevated, 189/80, states is always under 140/90 at home.  Took BP meds at 0730.  Is taking BP daily and keeping a diary.

## 2024-03-12 NOTE — Addendum Note (Signed)
 Addended by: TIMMY MAUDE SAUNDERS on: 03/12/2024 09:43 AM   Modules accepted: Orders

## 2024-03-12 NOTE — Telephone Encounter (Signed)
 Clinical Social Work was referred by Charity fundraiser for assessment of psychosocial needs.  CSW attempted to contact patient by phone.  Left voicemail with contact information and request for return call.

## 2024-03-13 LAB — CANCER ANTIGEN 27.29: CA 27.29: 60.7 U/mL — ABNORMAL HIGH (ref 0.0–38.6)

## 2024-03-16 ENCOUNTER — Other Ambulatory Visit (HOSPITAL_BASED_OUTPATIENT_CLINIC_OR_DEPARTMENT_OTHER): Payer: Self-pay

## 2024-03-18 ENCOUNTER — Inpatient Hospital Stay: Payer: Medicare (Managed Care) | Admitting: Licensed Clinical Social Worker

## 2024-03-18 ENCOUNTER — Other Ambulatory Visit (HOSPITAL_BASED_OUTPATIENT_CLINIC_OR_DEPARTMENT_OTHER): Payer: Self-pay

## 2024-03-18 NOTE — Progress Notes (Signed)
 [facility] CSW Progress Note  Visual merchandiser met with patient to follow-up on need for community resources.    Interventions: Provided patient with assistance in filling out a food stamps application.  CSW provided patient with a copy and faxed to DSS.      Follow Up Plan:  Patient will contact CSW with any support or resource needs    Macario CHRISTELLA Au, LCSW Clinical Social Worker [facility]

## 2024-03-24 ENCOUNTER — Other Ambulatory Visit (HOSPITAL_BASED_OUTPATIENT_CLINIC_OR_DEPARTMENT_OTHER): Payer: Self-pay

## 2024-04-01 ENCOUNTER — Telehealth: Payer: Self-pay

## 2024-04-01 ENCOUNTER — Other Ambulatory Visit (HOSPITAL_BASED_OUTPATIENT_CLINIC_OR_DEPARTMENT_OTHER): Payer: Self-pay

## 2024-04-01 NOTE — Telephone Encounter (Signed)
 Clinical Social Worker returned patient's call.  Left a voicemail informing her that the [facility] did not have any food available from the pantry.  Left a callback number.

## 2024-04-02 ENCOUNTER — Other Ambulatory Visit (HOSPITAL_BASED_OUTPATIENT_CLINIC_OR_DEPARTMENT_OTHER): Payer: Self-pay

## 2024-04-06 ENCOUNTER — Inpatient Hospital Stay: Payer: Medicare (Managed Care)

## 2024-04-06 NOTE — Progress Notes (Signed)
 [facility] CSW Progress Note  Clinical Child psychotherapist received call from patient regarding [employer] program for low income elderly customers.  .    Interventions: Provided patient with information about the program and encouraged her to contact DSS who sponsors the program.  She contacted DSS and was informed they had not received her food stamps application.  CSW faxed again to the number provided [phone])       Follow Up Plan:  Patient will contact CSW with any support or resource needs    Macario CHRISTELLA Au, LCSW Clinical Social Worker [facility]

## 2024-04-09 ENCOUNTER — Encounter: Payer: Self-pay | Admitting: Hematology & Oncology

## 2024-04-09 ENCOUNTER — Inpatient Hospital Stay: Payer: Medicare (Managed Care)

## 2024-04-09 NOTE — Progress Notes (Signed)
 Patient called to inquire about applying for [employer]. Provided Betty's number whom is advocate for [facility] and advised patient what is needed to apply. She verbalized understanding.  Staff message sent to [facility] to advise.

## 2024-04-09 NOTE — Progress Notes (Signed)
 [facility] CSW Progress Note  Clinical Child psychotherapist contacted patient by phone to follow-up on financial concerns.    Interventions: Confirmed that patient followed up with DSS regarding her food stamp application.  She was told she should receive a letter in the next few days.  She has also been communicating with the breast cancer grants to obtain additional assistance.      Follow Up Plan:  CSW will follow-up with patient by phone     Macario CHRISTELLA Au, LCSW Clinical Social Worker [facility]

## 2024-04-10 ENCOUNTER — Telehealth: Payer: Self-pay | Admitting: Pharmacy Technician

## 2024-04-10 NOTE — Telephone Encounter (Signed)
 Attempted to contact patient regarding [employer].  Unable to reach.  Left message for patient to call me.  Karina White Patient Services Navigator [facility]

## 2024-04-16 ENCOUNTER — Other Ambulatory Visit (HOSPITAL_BASED_OUTPATIENT_CLINIC_OR_DEPARTMENT_OTHER): Payer: Self-pay

## 2024-04-21 ENCOUNTER — Telehealth: Payer: Self-pay

## 2024-04-21 ENCOUNTER — Telehealth: Payer: Self-pay | Admitting: Pharmacy Technician

## 2024-04-21 NOTE — Telephone Encounter (Signed)
 Patient called expressing concern about whether the payment to [employer] from [employer] would be received by the due date of 8/25. Patient requested that I contact [employer] to make aware that we are paying $776.12 of her bill.  I contacted [employer] and spoke with Ty.  Ty made a note of the amount that we are paying toward the bill.  Provided me with confirmation #[id number].  Ty stated that patient's service would not be interrupted.  I called patient and made aware.   Patient stated that she did not have any other needs at the moment but would like to know how to inquire about other resources in the future.  I told patient that she could reach out to me or [facility].  I also provide patient with the phone number 211, which is a 3 digit number that connects callers to health and human services within their community.  Dickey DOROTHA Fritter Patient Services Navigator [facility]

## 2024-04-21 NOTE — Telephone Encounter (Signed)
 Pt informed her PET was authorized and provided her with the auth number (Auth# [id number] from 08.07.2025-11.05.2025 )

## 2024-04-27 ENCOUNTER — Encounter (HOSPITAL_COMMUNITY)
Admission: RE | Admit: 2024-04-27 | Discharge: 2024-04-27 | Disposition: A | Discharge status: Home / Self Care | Payer: Medicare (Managed Care) | Source: Ambulatory Visit | Attending: Hematology & Oncology | Admitting: Hematology & Oncology

## 2024-04-27 DIAGNOSIS — C7951 Secondary malignant neoplasm of bone: Secondary | ICD-10-CM | POA: Insufficient documentation

## 2024-04-27 DIAGNOSIS — Z853 Personal history of malignant neoplasm of breast: Secondary | ICD-10-CM | POA: Diagnosis not present

## 2024-04-27 DIAGNOSIS — C50919 Malignant neoplasm of unspecified site of unspecified female breast: Secondary | ICD-10-CM | POA: Diagnosis present

## 2024-04-27 LAB — GLUCOSE, CAPILLARY: Glucose-Capillary: 110 mg/dL — ABNORMAL HIGH (ref 70–99)

## 2024-04-27 MED ORDER — FLUDEOXYGLUCOSE F - 18 (FDG) INJECTION
9.5500 | Freq: Once | INTRAVENOUS | Status: AC
  Administered 2024-04-27: 9.55 via INTRAVENOUS

## 2024-04-30 ENCOUNTER — Other Ambulatory Visit: Payer: Self-pay

## 2024-04-30 ENCOUNTER — Other Ambulatory Visit (HOSPITAL_BASED_OUTPATIENT_CLINIC_OR_DEPARTMENT_OTHER): Payer: Self-pay

## 2024-04-30 ENCOUNTER — Other Ambulatory Visit: Payer: Self-pay | Admitting: Hematology & Oncology

## 2024-04-30 ENCOUNTER — Inpatient Hospital Stay: Payer: Medicare (Managed Care) | Attending: Hematology & Oncology

## 2024-04-30 DIAGNOSIS — Z1721 Progesterone receptor positive status: Secondary | ICD-10-CM | POA: Insufficient documentation

## 2024-04-30 DIAGNOSIS — M7989 Other specified soft tissue disorders: Secondary | ICD-10-CM | POA: Insufficient documentation

## 2024-04-30 DIAGNOSIS — C50919 Malignant neoplasm of unspecified site of unspecified female breast: Secondary | ICD-10-CM

## 2024-04-30 DIAGNOSIS — J90 Pleural effusion, not elsewhere classified: Secondary | ICD-10-CM | POA: Insufficient documentation

## 2024-04-30 DIAGNOSIS — C782 Secondary malignant neoplasm of pleura: Secondary | ICD-10-CM | POA: Insufficient documentation

## 2024-04-30 DIAGNOSIS — C7951 Secondary malignant neoplasm of bone: Secondary | ICD-10-CM | POA: Insufficient documentation

## 2024-04-30 DIAGNOSIS — R918 Other nonspecific abnormal finding of lung field: Secondary | ICD-10-CM | POA: Insufficient documentation

## 2024-04-30 DIAGNOSIS — Z170 Estrogen receptor positive status [ER+]: Secondary | ICD-10-CM | POA: Insufficient documentation

## 2024-04-30 DIAGNOSIS — I700 Atherosclerosis of aorta: Secondary | ICD-10-CM | POA: Insufficient documentation

## 2024-04-30 DIAGNOSIS — Z79811 Long term (current) use of aromatase inhibitors: Secondary | ICD-10-CM | POA: Insufficient documentation

## 2024-04-30 DIAGNOSIS — Z1731 Human epidermal growth factor receptor 2 positive status: Secondary | ICD-10-CM | POA: Insufficient documentation

## 2024-04-30 MED ORDER — TELMISARTAN 40 MG PO TABS
40.0000 mg | ORAL_TABLET | Freq: Every day | ORAL | 5 refills | Status: DC
  Filled 2024-04-30 (×2): qty 30, 30d supply, fill #0

## 2024-04-30 MED ORDER — TELMISARTAN 40 MG PO TABS
40.0000 mg | ORAL_TABLET | Freq: Every day | ORAL | 1 refills | Status: AC
  Filled 2024-04-30 – 2024-06-01 (×2): qty 90, 90d supply, fill #0
  Filled 2024-08-29: qty 90, 90d supply, fill #1

## 2024-04-30 NOTE — Progress Notes (Signed)
 [facility] CSW Progress Note  Visual merchandiser received a call from patient  to follow-up on need for community resources.    Interventions: Provided patient with information about breast cancer grants.  Encouraged her to contact the agencies she has applied with to check on the status of her applications.  Also discussed the [employer] which she has received.       Follow Up Plan:  Patient will contact CSW with any support or resource needs    Macario CHRISTELLA Au, LCSW Clinical Social Worker [facility]

## 2024-04-30 NOTE — Telephone Encounter (Signed)
 This encounter was created in error - please disregard.

## 2024-05-01 ENCOUNTER — Other Ambulatory Visit: Payer: Self-pay

## 2024-05-07 ENCOUNTER — Ambulatory Visit: Payer: Medicare (Managed Care) | Admitting: Hematology & Oncology

## 2024-05-07 ENCOUNTER — Inpatient Hospital Stay: Payer: Medicare (Managed Care)

## 2024-05-08 ENCOUNTER — Ambulatory Visit (HOSPITAL_COMMUNITY)
Admission: RE | Admit: 2024-05-08 | Discharge: 2024-05-08 | Disposition: A | Discharge status: Home / Self Care | Payer: Medicare (Managed Care) | Source: Ambulatory Visit | Attending: Hematology & Oncology | Admitting: Hematology & Oncology

## 2024-05-08 ENCOUNTER — Inpatient Hospital Stay: Payer: Medicare (Managed Care)

## 2024-05-08 ENCOUNTER — Telehealth: Payer: Self-pay | Admitting: Pharmacy Technician

## 2024-05-08 ENCOUNTER — Encounter: Payer: Self-pay | Admitting: Hematology & Oncology

## 2024-05-08 ENCOUNTER — Other Ambulatory Visit: Payer: Self-pay | Admitting: Radiology

## 2024-05-08 ENCOUNTER — Inpatient Hospital Stay: Payer: Medicare (Managed Care) | Admitting: Hematology & Oncology

## 2024-05-08 ENCOUNTER — Ambulatory Visit (HOSPITAL_COMMUNITY)
Admission: RE | Admit: 2024-05-08 | Discharge: 2024-05-08 | Disposition: A | Discharge status: Home / Self Care | Payer: Medicare (Managed Care) | Source: Ambulatory Visit | Attending: Radiology | Admitting: Radiology

## 2024-05-08 ENCOUNTER — Other Ambulatory Visit (HOSPITAL_COMMUNITY)
Admission: RE | Admit: 2024-05-08 | Discharge: 2024-05-08 | Disposition: A | Discharge status: Home / Self Care | Payer: Medicare (Managed Care) | Source: Ambulatory Visit | Attending: Radiology | Admitting: Radiology

## 2024-05-08 VITALS — BP 114/94

## 2024-05-08 VITALS — BP 168/68 | HR 52 | Temp 98.3°F | Resp 20 | Ht 67.0 in | Wt 197.8 lb

## 2024-05-08 DIAGNOSIS — C50919 Malignant neoplasm of unspecified site of unspecified female breast: Secondary | ICD-10-CM | POA: Insufficient documentation

## 2024-05-08 DIAGNOSIS — Z79811 Long term (current) use of aromatase inhibitors: Secondary | ICD-10-CM | POA: Diagnosis not present

## 2024-05-08 DIAGNOSIS — C7951 Secondary malignant neoplasm of bone: Secondary | ICD-10-CM | POA: Insufficient documentation

## 2024-05-08 DIAGNOSIS — I700 Atherosclerosis of aorta: Secondary | ICD-10-CM | POA: Diagnosis not present

## 2024-05-08 DIAGNOSIS — Z170 Estrogen receptor positive status [ER+]: Secondary | ICD-10-CM | POA: Diagnosis not present

## 2024-05-08 DIAGNOSIS — J90 Pleural effusion, not elsewhere classified: Secondary | ICD-10-CM | POA: Diagnosis not present

## 2024-05-08 DIAGNOSIS — Z1721 Progesterone receptor positive status: Secondary | ICD-10-CM | POA: Diagnosis not present

## 2024-05-08 DIAGNOSIS — Z1731 Human epidermal growth factor receptor 2 positive status: Secondary | ICD-10-CM | POA: Diagnosis not present

## 2024-05-08 DIAGNOSIS — R918 Other nonspecific abnormal finding of lung field: Secondary | ICD-10-CM | POA: Diagnosis not present

## 2024-05-08 DIAGNOSIS — C782 Secondary malignant neoplasm of pleura: Secondary | ICD-10-CM | POA: Diagnosis not present

## 2024-05-08 DIAGNOSIS — M7989 Other specified soft tissue disorders: Secondary | ICD-10-CM | POA: Diagnosis not present

## 2024-05-08 LAB — CMP (CANCER CENTER ONLY)
ALT: 18 U/L (ref 0–44)
AST: 27 U/L (ref 15–41)
Albumin: 4.4 g/dL (ref 3.5–5.0)
Alkaline Phosphatase: 86 U/L (ref 38–126)
Anion gap: 12 (ref 5–15)
BUN: 15 mg/dL (ref 8–23)
CO2: 27 mmol/L (ref 22–32)
Calcium: 9.7 mg/dL (ref 8.9–10.3)
Chloride: 104 mmol/L (ref 98–111)
Creatinine: 1.39 mg/dL — ABNORMAL HIGH (ref 0.44–1.00)
GFR, Estimated: 41 mL/min — ABNORMAL LOW (ref >60)
Glucose, Bld: 91 mg/dL (ref 70–99)
Potassium: 4.3 mmol/L (ref 3.5–5.1)
Sodium: 144 mmol/L (ref 135–145)
Total Bilirubin: 0.3 mg/dL (ref 0.0–1.2)
Total Protein: 7.2 g/dL (ref 6.5–8.1)

## 2024-05-08 LAB — CBC WITH DIFFERENTIAL (CANCER CENTER ONLY)
Abs Immature Granulocytes: 0 K/uL (ref 0.00–0.07)
Basophils Absolute: 0.1 K/uL (ref 0.0–0.1)
Basophils Relative: 2 %
Eosinophils Absolute: 0.1 K/uL (ref 0.0–0.5)
Eosinophils Relative: 2 %
HCT: 32.7 % — ABNORMAL LOW (ref 36.0–46.0)
Hemoglobin: 11.1 g/dL — ABNORMAL LOW (ref 12.0–15.0)
Immature Granulocytes: 0 %
Lymphocytes Relative: 56 %
Lymphs Abs: 1.9 K/uL (ref 0.7–4.0)
MCH: 32.3 pg (ref 26.0–34.0)
MCHC: 33.9 g/dL (ref 30.0–36.0)
MCV: 95.1 fL (ref 80.0–100.0)
Monocytes Absolute: 0.5 K/uL (ref 0.1–1.0)
Monocytes Relative: 15 %
Neutro Abs: 0.8 K/uL — ABNORMAL LOW (ref 1.7–7.7)
Neutrophils Relative %: 25 %
Platelet Count: 149 K/uL — ABNORMAL LOW (ref 150–400)
RBC: 3.44 MIL/uL — ABNORMAL LOW (ref 3.87–5.11)
RDW: 14.3 % (ref 11.5–15.5)
WBC Count: 3.4 K/uL — ABNORMAL LOW (ref 4.0–10.5)
nRBC: 0 % (ref 0.0–0.2)

## 2024-05-08 MED ORDER — LIDOCAINE-EPINEPHRINE (PF) 2 %-1:200000 IJ SOLN
INTRAMUSCULAR | Status: AC
  Filled 2024-05-08: qty 20

## 2024-05-08 NOTE — Progress Notes (Signed)
 BP remains elevated, 168/68, states BP is running under 140/90 at home.  Instructed to continue to monitor at home and notify PCP if it starts running over 140/90.  Verbalized understanding.

## 2024-05-08 NOTE — Progress Notes (Signed)
 Hematology and Oncology Follow Up Visit  Karina White [id number] 1955/12/21 68 y.o. 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  Tooth extraction on 02/20/2024     Interim History:  Karina White is back for follow-up.  Apparently, she is going to have extractions of her upper teeth.  This will happen on 05/12/2024.  We did do a PET scan on her.  This was done on 04/27/2024.  The PET scan showed that she had no active malignancy.  However, she still had a recurrence of this right pleural effusion.  We are going to have to see back in this range again.  I am not sure as to why she has this pleural effusion.  We have checked it on several occasions and that there is been no malignancy.  At some point, if this continues to recur, then we are going to have to see about maybe a VATS procedure and doing a pleural biopsy.  Her last CA 27.29 was down to 61.  She has had no cough.  There is been no chest pain.  She has had no nausea or vomiting.  She has had no change in bowel or bladder habits.  She has had little bit of leg swelling.  I told her that I thought the leg swelling probably was from her amlodipine .  Currently, I would say that her performance status is probably ECOG 1. .   Medications:  Current Outpatient Medications:    amLODipine  (NORVASC ) 10 MG tablet, Take 1 tablet (10 mg total) by mouth daily., Disp: 30 tablet, Rfl: 0   aspirin  EC 81 MG tablet, Take 81 mg by mouth daily. 01/01/2024 States takes every other day., Disp: , Rfl:    carvedilol  (COREG ) 3.125 MG tablet, Take 1 tablet (3.125 mg total) by mouth 2 (two) times daily with a meal., Disp: 60 tablet, Rfl: 6   hydrochlorothiazide  (HYDRODIURIL ) 25 MG  tablet, Take 1 tablet (25 mg total) by mouth daily., Disp: 30 tablet, Rfl: 6   letrozole  (FEMARA ) 2.5 MG tablet, Take 1 tablet (2.5 mg total) by mouth daily., Disp: 30 tablet, Rfl: 6   Multiple Vitamins-Minerals (CENTRUM ADULTS PO), Take 1 tablet by mouth daily., Disp: , Rfl:    Omega 3 1200 MG CAPS, Take 1,200 mg by mouth daily., Disp: , Rfl:    omeprazole (PRILOSEC) 10 MG capsule, Take 10 mg by mouth daily., Disp: , Rfl:    potassium chloride  SA (KLOR-CON  M) 20 MEQ tablet, Take 1 tablet (20 mEq total) by mouth every other day., Disp: 30 tablet, Rfl: 6   ribociclib  succ (KISQALI , 600 MG DOSE,) 200 MG Therapy Pack, Take 3 tablets (600 mg total) by mouth daily. Take for 21 days on, 7 days off, repeat every 28 days., Disp: 63 tablet, Rfl: 3   telmisartan  (MICARDIS ) 40 MG tablet, Take 1 tablet (40 mg total) by mouth daily., Disp: 90 tablet, Rfl: 1   VITAMIN D, CHOLECALCIFEROL , PO, Take 2,000 Units by mouth daily., Disp: , Rfl:    traMADol  (ULTRAM ) 50 MG tablet, Take 1 tablet (50 mg total) by mouth every 4 (four) to 6 (six) hours as needed., Disp: 6 tablet, Rfl: 0  Allergies:  Allergies  Allergen Reactions   Penicillins Hives and Itching   Codeine Nausea And Vomiting and Other (See Comments)   Penicillin G Other (See Comments)    Past Medical History, Surgical history, Social history, and Family History were reviewed and updated.  Review of Systems: Review of Systems  Constitutional: Negative.  Negative for appetite change and unexpected weight change.  HENT:  Negative.    Eyes: Negative.   Respiratory:  Positive for shortness of breath.   Gastrointestinal: Negative.   Endocrine: Negative.   Genitourinary: Negative.    Musculoskeletal:  Positive for back pain.  Skin: Negative.   Neurological: Negative.   Hematological: Negative.   Psychiatric/Behavioral: Negative.      Physical Exam:  Vital signs show temperature of 98.3.  Pulse 52.  Blood pressure 168/68.  Weight is 197 pounds. .      Wt Readings from Last 3 Encounters:  05/08/24 197 lb 12.8 oz (89.7 kg)  03/12/24 194 lb 12.8 oz (88.4 kg)  02/17/24 196 lb (88.9 kg)    Physical Exam Vitals reviewed.  Constitutional:      Comments: Her breast exam shows right breast with no masses, edema or erythema.  There is no right axillary adenopathy.  Left breast does not show any obvious mass.  There may be a little bit of firmness at the 12 o'clock position.  I cannot palpate any left axillary adenopathy.    HENT:     Head: Normocephalic and atraumatic.  Eyes:     Pupils: Pupils are equal, round, and reactive to light.  Cardiovascular:     Rate and Rhythm: Normal rate and regular rhythm.     Heart sounds: Normal heart sounds.  Pulmonary:     Effort: Pulmonary effort is normal.     Breath sounds: Normal breath sounds.  Abdominal:     General: Bowel sounds are normal.     Palpations: Abdomen is soft.     Comments: Abdominal exam is soft.  She has decent bowel sounds.  There is no fluid wave.  There is no palpable liver or spleen tip.  Musculoskeletal:        General: No tenderness or deformity. Normal range of motion.     Cervical back: Normal range of motion.     Comments: Back exam shows some slight tenderness to palpation over the thoracic and lumbar spine.  Lymphadenopathy:     Cervical: No cervical adenopathy.  Skin:    General: Skin is warm and dry.     Findings: No erythema or rash.  Neurological:     Mental Status: She is alert and oriented to person, place, and time.  Psychiatric:        Behavior: Behavior normal.        Thought Content: Thought content normal.        Judgment: Judgment normal.      Lab Results  Component Value Date   WBC 3.4 (L) 05/08/2024   HGB 11.1 (L) 05/08/2024   HCT 32.7 (L) 05/08/2024   MCV 95.1 05/08/2024   PLT 149 (L) 05/08/2024     Chemistry      Component Value Date/Time   NA 144 05/08/2024 0828   K 4.3 05/08/2024 0828   CL 104 05/08/2024 0828   CO2 27  05/08/2024 0828   BUN 15 05/08/2024 0828   CREATININE 1.39 (H) 05/08/2024 0828      Component Value Date/Time   CALCIUM 9.7 05/08/2024 0828   ALKPHOS 86 05/08/2024  9171   AST 27 05/08/2024 0828   ALT 18 05/08/2024 0828   BILITOT 0.3 05/08/2024 0828      Impression and Plan: Ms. Blakeney is a very nice 68 year old Afro-American female.  She has stage IV adenocarcinoma of the breast.  Thankfully, her tumor is ER/PR positive.  It is low HER2 positive.  We currently have her on antiestrogen therapy.  I think that the antiestrogen therapy is doing a great job.    Again, I am not sure as to why she has a pleural effusion.  We will see about having this drained today.  She does have dental extractions next week.  As such, I probably would hold off on Xgeva  through the rest of this year.  I am just glad that her quality of life is doing so well right now.  I would hate to make any changes with this for right now.  I will plan to have her come back in about 5 to 6 weeks.  We will try to time everything so we can try to get her through the Holiday season without having to see her.  Again, her faith has been incredibly strong.  We had a very good prayer at the end of our meeting.   Maude JONELLE Crease, MD 8/29/20259:29 AM

## 2024-05-08 NOTE — Procedures (Signed)
 Ultrasound-guided diagnostic and therapeutic right thoracentesis performed yielding 850 cc of yellow  fluid. No immediate complications. Follow-up chest x-ray revealed no pneumothorax. A portion of the fluid was submitted to the lab for cytology.  EBL none.

## 2024-05-08 NOTE — Telephone Encounter (Signed)
 Patient inquiring about check# and date check mailed to [employer] and the [place] [employer].  Reached out to [name] to find out and will communicate with patient once I know.  Karina White Patient Pharmacologist [facility]

## 2024-05-09 LAB — CANCER ANTIGEN 27.29: CA 27.29: 60.5 U/mL — ABNORMAL HIGH (ref 0.0–38.6)

## 2024-05-12 ENCOUNTER — Encounter: Payer: Self-pay | Admitting: Hematology & Oncology

## 2024-05-12 ENCOUNTER — Other Ambulatory Visit (HOSPITAL_BASED_OUTPATIENT_CLINIC_OR_DEPARTMENT_OTHER): Payer: Self-pay

## 2024-05-12 MED ORDER — CLINDAMYCIN HCL 150 MG PO CAPS
150.0000 mg | ORAL_CAPSULE | Freq: Three times a day (TID) | ORAL | 0 refills | Status: DC
  Filled 2024-05-12: qty 15, 5d supply, fill #0

## 2024-05-12 MED ORDER — HYDROCODONE-ACETAMINOPHEN 5-325 MG PO TABS
1.0000 | ORAL_TABLET | Freq: Four times a day (QID) | ORAL | 0 refills | Status: DC | PRN
  Filled 2024-05-12: qty 8, 2d supply, fill #0

## 2024-05-12 MED ORDER — CHLORHEXIDINE GLUCONATE 0.12 % MT SOLN
15.0000 mL | Freq: Two times a day (BID) | OROMUCOSAL | 0 refills | Status: DC
  Filled 2024-05-12: qty 473, 16d supply, fill #0

## 2024-05-12 MED ORDER — ONDANSETRON 4 MG PO TBDP
4.0000 mg | ORAL_TABLET | Freq: Three times a day (TID) | ORAL | 0 refills | Status: DC | PRN
  Filled 2024-05-12: qty 6, 2d supply, fill #0

## 2024-05-12 MED ORDER — LORAZEPAM 2 MG PO TABS
2.0000 mg | ORAL_TABLET | ORAL | 0 refills | Status: DC
  Filled 2024-05-12: qty 1, 1d supply, fill #0

## 2024-05-13 ENCOUNTER — Other Ambulatory Visit (HOSPITAL_BASED_OUTPATIENT_CLINIC_OR_DEPARTMENT_OTHER): Payer: Self-pay

## 2024-05-13 LAB — CYTOLOGY - NON PAP

## 2024-05-14 ENCOUNTER — Telehealth: Payer: Self-pay | Admitting: Pharmacy Technician

## 2024-05-14 NOTE — Telephone Encounter (Signed)
 Attempted to call patient to find out if she had contacted [employer] and the [employer] to find out if they had received the checks that [facility] mailed.  Unable to reach patient.  Left message on voicemail.  Karina White Patient Pharmacologist [facility]

## 2024-05-18 ENCOUNTER — Telehealth: Payer: Self-pay | Admitting: Pharmacy Technician

## 2024-05-18 NOTE — Telephone Encounter (Signed)
 Patient called making me aware that [employer] and [employer] had received checks for the [employer].  Patient also inquiring about obtaining food from [place]'s food pantry.  Send referral to [employer].  Dickey DOROTHA Fritter Patient Pharmacologist [facility]

## 2024-05-20 ENCOUNTER — Inpatient Hospital Stay: Payer: Medicare (Managed Care) | Attending: Hematology & Oncology | Admitting: Licensed Clinical Social Worker

## 2024-05-20 NOTE — Progress Notes (Signed)
 [facility] CSW Progress Note  Clinical Child psychotherapist contacted patient by phone to follow-up on need for community resources.    Interventions: Provided patient with information about food being available at the [facility].  Patient also confirmed that she is receiving assistance with the [employer].       Follow Up Plan:  Patient will contact CSW with any support or resource needs    Macario CHRISTELLA Au, LCSW Clinical Social Worker [facility]

## 2024-05-25 ENCOUNTER — Other Ambulatory Visit: Payer: Self-pay | Admitting: *Deleted

## 2024-05-25 ENCOUNTER — Other Ambulatory Visit (HOSPITAL_BASED_OUTPATIENT_CLINIC_OR_DEPARTMENT_OTHER): Payer: Self-pay

## 2024-05-25 ENCOUNTER — Telehealth: Payer: Self-pay | Admitting: *Deleted

## 2024-05-25 MED ORDER — METHYLPREDNISOLONE 4 MG PO TBPK
ORAL_TABLET | ORAL | 0 refills | Status: DC
  Filled 2024-05-25: qty 21, 6d supply, fill #0

## 2024-05-25 MED ORDER — AZITHROMYCIN 250 MG PO TABS
ORAL_TABLET | ORAL | 0 refills | Status: AC
  Filled 2024-05-25 (×2): qty 6, 5d supply, fill #0

## 2024-05-25 MED ORDER — ZITHROMAX 1 G PO PACK
1.0000 g | PACK | Freq: Once | ORAL | 0 refills | Status: DC
  Filled 2024-05-25: qty 1, 1d supply, fill #0

## 2024-05-25 NOTE — Telephone Encounter (Signed)
 Call received from patient stating that she has had a cough with green sputum and body aches since Saturday.  Pt denies any fevers, chills or SOB.  Dr. Timmy notified and order received for pt to to take a Medrol  Dose Pack and Z-pack.  Pt notified of MD orders and prescriptions sent to [facility] pharmacy per pt.'s request.  Pt is appreciative of assistance and has no further questions or concerns at this time.

## 2024-05-28 ENCOUNTER — Inpatient Hospital Stay: Payer: Medicare (Managed Care)

## 2024-05-28 NOTE — Progress Notes (Signed)
 [facility] CSW Progress Note  Clinical Child psychotherapist received call from patient regarding need for community resources.    Interventions: Provided patient with information about Medicaid per her request.  She inquired about additional resources.  CSW informed her of limited funds in the community and she said she understood.       Follow Up Plan:  Patient will contact CSW with any support or resource needs    Macario CHRISTELLA Au, LCSW Clinical Social Worker [facility]

## 2024-06-01 ENCOUNTER — Other Ambulatory Visit (HOSPITAL_BASED_OUTPATIENT_CLINIC_OR_DEPARTMENT_OTHER): Payer: Self-pay

## 2024-06-10 ENCOUNTER — Inpatient Hospital Stay: Payer: Medicare (Managed Care) | Attending: Hematology & Oncology

## 2024-06-10 DIAGNOSIS — C50911 Malignant neoplasm of unspecified site of right female breast: Secondary | ICD-10-CM | POA: Insufficient documentation

## 2024-06-10 DIAGNOSIS — Z1731 Human epidermal growth factor receptor 2 positive status: Secondary | ICD-10-CM | POA: Insufficient documentation

## 2024-06-10 DIAGNOSIS — Z853 Personal history of malignant neoplasm of breast: Secondary | ICD-10-CM | POA: Insufficient documentation

## 2024-06-10 DIAGNOSIS — J948 Other specified pleural conditions: Secondary | ICD-10-CM | POA: Insufficient documentation

## 2024-06-10 DIAGNOSIS — C50919 Malignant neoplasm of unspecified site of unspecified female breast: Secondary | ICD-10-CM | POA: Insufficient documentation

## 2024-06-10 DIAGNOSIS — C782 Secondary malignant neoplasm of pleura: Secondary | ICD-10-CM | POA: Insufficient documentation

## 2024-06-10 DIAGNOSIS — Z9889 Other specified postprocedural states: Secondary | ICD-10-CM | POA: Insufficient documentation

## 2024-06-10 DIAGNOSIS — C7951 Secondary malignant neoplasm of bone: Secondary | ICD-10-CM | POA: Insufficient documentation

## 2024-06-10 DIAGNOSIS — J90 Pleural effusion, not elsewhere classified: Secondary | ICD-10-CM | POA: Insufficient documentation

## 2024-06-10 DIAGNOSIS — Z1721 Progesterone receptor positive status: Secondary | ICD-10-CM | POA: Insufficient documentation

## 2024-06-10 DIAGNOSIS — Z170 Estrogen receptor positive status [ER+]: Secondary | ICD-10-CM | POA: Insufficient documentation

## 2024-06-10 DIAGNOSIS — Z79811 Long term (current) use of aromatase inhibitors: Secondary | ICD-10-CM | POA: Insufficient documentation

## 2024-06-10 NOTE — Progress Notes (Signed)
 [facility] CSW Progress Note  Clinical Child psychotherapist received call from patient regarding SDOH needs.    Interventions: Provided patient with information about availability of specific food items from the food pantry at the cancer center.  Patient stated she will check at her next appointment on 10/10.  Patient stated she understood the policy.       Follow Up Plan:  Patient will contact CSW with any support or resource needs    Macario CHRISTELLA Au, LCSW Clinical Social Worker [facility]

## 2024-06-14 ENCOUNTER — Other Ambulatory Visit: Payer: Self-pay

## 2024-06-14 ENCOUNTER — Encounter (HOSPITAL_BASED_OUTPATIENT_CLINIC_OR_DEPARTMENT_OTHER): Payer: Self-pay

## 2024-06-14 ENCOUNTER — Emergency Department (HOSPITAL_BASED_OUTPATIENT_CLINIC_OR_DEPARTMENT_OTHER): Payer: Medicare (Managed Care)

## 2024-06-14 ENCOUNTER — Emergency Department (HOSPITAL_BASED_OUTPATIENT_CLINIC_OR_DEPARTMENT_OTHER)
Admission: EM | Admit: 2024-06-14 | Discharge: 2024-06-14 | Disposition: A | Discharge status: Home / Self Care | Payer: Medicare (Managed Care) | Attending: Emergency Medicine | Admitting: Emergency Medicine

## 2024-06-14 DIAGNOSIS — R519 Headache, unspecified: Secondary | ICD-10-CM | POA: Diagnosis not present

## 2024-06-14 DIAGNOSIS — C50919 Malignant neoplasm of unspecified site of unspecified female breast: Secondary | ICD-10-CM | POA: Diagnosis not present

## 2024-06-14 DIAGNOSIS — R059 Cough, unspecified: Secondary | ICD-10-CM | POA: Diagnosis present

## 2024-06-14 DIAGNOSIS — Z7982 Long term (current) use of aspirin: Secondary | ICD-10-CM | POA: Insufficient documentation

## 2024-06-14 DIAGNOSIS — R0689 Other abnormalities of breathing: Secondary | ICD-10-CM | POA: Diagnosis not present

## 2024-06-14 DIAGNOSIS — R051 Acute cough: Secondary | ICD-10-CM | POA: Insufficient documentation

## 2024-06-14 DIAGNOSIS — Z1722 Progesterone receptor negative status: Secondary | ICD-10-CM | POA: Diagnosis not present

## 2024-06-14 LAB — CBC WITH DIFFERENTIAL/PLATELET
Abs Immature Granulocytes: 0.01 K/uL (ref 0.00–0.07)
Basophils Absolute: 0 K/uL (ref 0.0–0.1)
Basophils Relative: 1 %
Eosinophils Absolute: 0 K/uL (ref 0.0–0.5)
Eosinophils Relative: 1 %
HCT: 30.6 % — ABNORMAL LOW (ref 36.0–46.0)
Hemoglobin: 10.4 g/dL — ABNORMAL LOW (ref 12.0–15.0)
Immature Granulocytes: 0 %
Lymphocytes Relative: 27 %
Lymphs Abs: 1.2 K/uL (ref 0.7–4.0)
MCH: 32 pg (ref 26.0–34.0)
MCHC: 34 g/dL (ref 30.0–36.0)
MCV: 94.2 fL (ref 80.0–100.0)
Monocytes Absolute: 0.3 K/uL (ref 0.1–1.0)
Monocytes Relative: 7 %
Neutro Abs: 2.9 K/uL (ref 1.7–7.7)
Neutrophils Relative %: 64 %
Platelets: 239 K/uL (ref 150–400)
RBC: 3.25 MIL/uL — ABNORMAL LOW (ref 3.87–5.11)
RDW: 14.6 % (ref 11.5–15.5)
Smear Review: NORMAL
WBC: 4.5 K/uL (ref 4.0–10.5)
nRBC: 0 % (ref 0.0–0.2)

## 2024-06-14 LAB — COMPREHENSIVE METABOLIC PANEL WITH GFR
ALT: 20 U/L (ref 0–44)
AST: 26 U/L (ref 15–41)
Albumin: 4.2 g/dL (ref 3.5–5.0)
Alkaline Phosphatase: 100 U/L (ref 38–126)
Anion gap: 12 (ref 5–15)
BUN: 13 mg/dL (ref 8–23)
CO2: 26 mmol/L (ref 22–32)
Calcium: 9.3 mg/dL (ref 8.9–10.3)
Chloride: 104 mmol/L (ref 98–111)
Creatinine, Ser: 1.56 mg/dL — ABNORMAL HIGH (ref 0.44–1.00)
GFR, Estimated: 36 mL/min — ABNORMAL LOW (ref >60)
Glucose, Bld: 93 mg/dL (ref 70–99)
Potassium: 4.3 mmol/L (ref 3.5–5.1)
Sodium: 141 mmol/L (ref 135–145)
Total Bilirubin: 0.6 mg/dL (ref 0.0–1.2)
Total Protein: 7 g/dL (ref 6.5–8.1)

## 2024-06-14 LAB — RESP PANEL BY RT-PCR (RSV, FLU A&B, COVID)  RVPGX2
Influenza A by PCR: NEGATIVE
Influenza B by PCR: NEGATIVE
Resp Syncytial Virus by PCR: NEGATIVE
SARS Coronavirus 2 by RT PCR: NEGATIVE

## 2024-06-14 LAB — PRO BRAIN NATRIURETIC PEPTIDE: Pro Brain Natriuretic Peptide: 103 pg/mL (ref <300.0)

## 2024-06-14 MED ORDER — LEVOFLOXACIN 750 MG PO TABS
750.0000 mg | ORAL_TABLET | Freq: Once | ORAL | Status: AC
  Administered 2024-06-14: 750 mg via ORAL
  Filled 2024-06-14: qty 1

## 2024-06-14 MED ORDER — LEVOFLOXACIN 750 MG PO TABS
750.0000 mg | ORAL_TABLET | Freq: Every day | ORAL | 0 refills | Status: DC
  Filled 2024-06-14: qty 5, 5d supply, fill #0

## 2024-06-14 MED ORDER — FLUCONAZOLE 150 MG PO TABS
150.0000 mg | ORAL_TABLET | Freq: Every day | ORAL | 0 refills | Status: AC
  Filled 2024-06-14: qty 1, 1d supply, fill #0

## 2024-06-14 NOTE — ED Provider Notes (Signed)
 [place] [facility] Provider Note   CSN: [id number] Arrival date & time: 06/14/24  9083     Patient presents with: Cough   Karina White is a 68 y.o. female.   68 y.o female with a PMH of Breast CA on oral chemotherapy presents to the ED with chief complaint of deep cough that has been ongoing since Thursday however worsened yesterday.  She reports a green thick sputum every time she coughs.  She reports she was previously seen by Dr. Timmy, had antibiotics along with steroids to help with her cough which did improve her symptoms for about 7 days.  She reports since she stopped the medicine her cough has not returned.  She did have her grandkids with her last week who appears to have a cough as well, they returned to Saturday and had continued cough.  She denies any exacerbating factors.  Has not had any over-the-counter medication to help with symptoms.  She reports she feels rattling in her chest whenever she tries to have the sputum,.  She denies any shortness of breath, chest pain, fever.  The history is provided by the patient.  Cough Cough characteristics:  Productive Sputum characteristics:  Green Severity:  Moderate Onset quality:  Gradual Duration:  4 days Timing:  Intermittent Progression:  Worsening Chronicity:  Recurrent Associated symptoms: no chest pain, no chills, no fever, no shortness of breath, no sore throat and no wheezing        Prior to Admission medications   Medication Sig Start Date End Date Taking? Authorizing Provider  levofloxacin (LEVAQUIN) 750 MG tablet Take 1 tablet (750 mg total) by mouth daily for 5 days. 06/14/24 06/19/24 Yes Maegen Wigle, PA-C  amLODipine  (NORVASC ) 10 MG tablet Take 1 tablet (10 mg total) by mouth daily. 06/19/23   Sherrill Cable Latif, DO  aspirin  EC 81 MG tablet Take 81 mg by mouth daily. 01/01/2024 States takes every other day.    [provider]  carvedilol  (COREG ) 3.125 MG  tablet Take 1 tablet (3.125 mg total) by mouth 2 (two) times daily with a meal. 11/19/23   Ennever, Maude SAUNDERS, MD  chlorhexidine  (PERIDEX ) 0.12 % solution Rinse mouth with 15 mLs (1 capful) 2 (two) times daily after brushing teeth. Swish for 30 seconds then spit out. 05/12/24     clindamycin  (CLEOCIN ) 150 MG capsule Take 1 capsule (150 mg total) by mouth 3 (three) times daily. 05/12/24     hydrochlorothiazide  (HYDRODIURIL ) 25 MG tablet Take 1 tablet (25 mg total) by mouth daily. 03/12/24   Timmy Maude SAUNDERS, MD  HYDROcodone -acetaminophen  (NORCO/VICODIN) 5-325 MG tablet Take 1 tablet by mouth every 6 (six) hours as needed. **Causes drowsiness** 05/12/24     letrozole  (FEMARA ) 2.5 MG tablet Take 1 tablet (2.5 mg total) by mouth daily. 02/28/24   Timmy Maude SAUNDERS, MD  LORazepam  (ATIVAN ) 2 MG tablet Take 1 tablet (2 mg total) by mouth 1 hour pre-op with small sip of water . 05/12/24     methylPREDNISolone  (MEDROL  DOSEPAK) 4 MG TBPK tablet Take as directed in package for 6 days. 05/25/24   Timmy Maude SAUNDERS, MD  Multiple Vitamins-Minerals (CENTRUM ADULTS PO) Take 1 tablet by mouth daily.    [provider]  Omega 3 1200 MG CAPS Take 1,200 mg by mouth daily.    [provider]  omeprazole (PRILOSEC) 10 MG capsule Take 10 mg by mouth daily.    [provider]  ondansetron  (ZOFRAN -ODT) 4 MG disintegrating  tablet Take 1 tablet (4 mg total) by mouth and dissolve on the tongue every 8 (eight) hours as needed for nausea and vomiting. 05/12/24     potassium chloride  SA (KLOR-CON  M) 20 MEQ tablet Take 1 tablet (20 mEq total) by mouth every other day. 03/12/24   Timmy Maude SAUNDERS, MD  ribociclib  succ (KISQALI , 600 MG DOSE,) 200 MG Therapy Pack Take 3 tablets (600 mg total) by mouth daily. Take for 21 days on, 7 days off, repeat every 28 days. 03/12/24   Timmy Maude SAUNDERS, MD  telmisartan  (MICARDIS ) 40 MG tablet Take 1 tablet (40 mg total) by mouth daily. 04/30/24   Timmy Maude SAUNDERS, MD  traMADol  (ULTRAM ) 50 MG  tablet Take 1 tablet (50 mg total) by mouth every 4 (four) to 6 (six) hours as needed. 02/20/24     VITAMIN D, CHOLECALCIFEROL , PO Take 2,000 Units by mouth daily.    [provider]    Allergies: Penicillins, Codeine, and Penicillin g    Review of Systems  Constitutional:  Negative for chills and fever.  HENT:  Negative for sore throat.   Respiratory:  Positive for cough. Negative for shortness of breath and wheezing.   Cardiovascular:  Negative for chest pain.  Gastrointestinal:  Negative for abdominal pain, diarrhea and vomiting.  All other systems reviewed and are negative.   Updated Vital Signs BP 129/70 (BP Location: Right Arm)   Pulse 60   Temp 98.9 F (37.2 C) (Oral)   Resp 16   Ht 5' 7 (1.702 m)   Wt 86.2 kg   SpO2 99%   BMI 29.76 kg/m   Physical Exam Vitals and nursing note reviewed.  Constitutional:      Appearance: Normal appearance.  HENT:     Head: Normocephalic and atraumatic.     Mouth/Throat:     Mouth: Mucous membranes are moist.     Pharynx: Posterior oropharyngeal erythema present.  Cardiovascular:     Rate and Rhythm: Normal rate.  Pulmonary:     Effort: Pulmonary effort is normal.     Breath sounds: Rales present.     Comments: Lungs are slightly diminished to auscultation with some noticeable Rales. Abdominal:     General: Abdomen is flat.  Musculoskeletal:     Cervical back: Normal range of motion and neck supple.  Skin:    General: Skin is warm and dry.  Neurological:     Mental Status: She is alert and oriented to person, place, and time.     (all labs ordered are listed, but only abnormal results are displayed) Labs Reviewed  CBC WITH DIFFERENTIAL/PLATELET - Abnormal; Notable for the following components:      Result Value   RBC 3.25 (*)    Hemoglobin 10.4 (*)    HCT 30.6 (*)    All other components within normal limits  COMPREHENSIVE METABOLIC PANEL WITH GFR - Abnormal; Notable for the following components:    Creatinine, Ser 1.56 (*)    GFR, Estimated 36 (*)    All other components within normal limits  RESP PANEL BY RT-PCR (RSV, FLU A&B, COVID)  [id number]  PRO BRAIN NATRIURETIC PEPTIDE    EKG: None  Radiology: DG Chest 2 View Result Date: 06/14/2024 CLINICAL DATA:  Cough with green sputum. EXAM: CHEST - 2 VIEW COMPARISON:  05/08/2024 FINDINGS: Right base atelectasis or infiltrate noted with progression of right pleural effusion now small to moderate in size. Left lung clear. Cardiopericardial silhouette is at upper limits of  normal for size. No acute bony abnormality. IMPRESSION: Progression of right pleural effusion with right base atelectasis or infiltrate. Electronically Signed   By: Camellia Candle M.D.   On: 06/14/2024 10:31     Procedures   Medications Ordered in the ED - No data to display  Clinical Course as of 06/14/24 1143  Sun Jun 14, 2024  6817 68 year old female with a history of cancer, chronic recurring right-sided pleural effusion, presented to ED with productive cough.  Patient reports she was treated September 15 with a Z-Pak and Medrol  Dosepak for productive cough and felt her symptoms get better.  However in the past 2 days her symptoms have worsened and this morning particularly bringing up a lot of green phlegm.  Her throat is raw from coughing.  He denies subjective fevers or chills at home.  She reports she has a mild headache.  On exam the patient is well-appearing.  She is not hypoxic.  She is not in respiratory distress.  She does have diminished breath sounds in the right lower lobe.  No audible wheezing.  No hypoxia.  Her x-ray shows persistent right sided moderate lower pleural effusion.  She had thoracentesis done for this 2 months ago.  She may need recurring thoracentesis or consideration of VATS per my review of external records and oncology note.  However, I think it is reasonable to check the patient's labs, consider empiric treatment for underlying infection, which  cannot be excluded from this chest x-ray.  I would consider Levaquin 750 mg for 5 days, given her penicillin allergies /hives.  Her COVID test is negative.  I suspect she will be stable for outpatient treatment. [MT]    Clinical Course User Index [MT] Trifan, Donnice PARAS, MD                                 Medical Decision Making Amount and/or Complexity of Data Reviewed Labs: ordered. Radiology: ordered.   Patient with a prior history of breast cancer on oral chemotherapy followed by Dr. Lizette at [facility].  Presents to the ED with a chief complaint of productive cough that has been ongoing for the past week.  Previously given medication to help with her cough such as steroids and cough medicine with improvement in symptoms.  Now reports the symptoms have now returned along with some green sputum cough. During evaluation there is slight rales noted along the bases.  No tachypnea or hypoxia noted.  She is not endorsing any chest pain or shortness of breath.  I do feel is reasonable to obtain a chest x-ray as symptoms have been ongoing for longer than 1 week.  X-ray of the chest showed: IMPRESSION:  Progression of right pleural effusion with right base atelectasis or  infiltrate.   Respiratory panel is negative for influenza, RSV, COVID-19.  In the setting of patient being cancer patient, recently finished a course of Z-Pak along with steroids.  However, he does report that the infection is not improving.  Will obtain blood work at this time.  CBC with no leukocytosis, hemoglobin is at her baseline.  CMP with no electrolyte derangement, creatinine is slightly elevated from prior will discuss increase hydration.  LFTs are within normal limits.  BMP is negative.  Patient does have a prior allergy noted, therefore will go home on Levaquin 750 daily for the next 5 days.  Portions of this note were generated with Dragon  dictation software. Dictation errors may occur despite best attempts at  proofreading.   Final diagnoses:  Acute cough    ED Discharge Orders          Ordered    levofloxacin (LEVAQUIN) 750 MG tablet  Daily        06/14/24 1143               Makinzi Prieur, PA-C 06/14/24 1143    Cottie Donnice PARAS, MD 06/14/24 1350

## 2024-06-14 NOTE — Discharge Instructions (Addendum)
 The chest x-ray today showed worsening pneumonia, will place on a short course of antibiotics to help with your symptoms.  Please take 1 tablet daily for the next 5 days.  If you experience any worsening symptoms please return to emergency department.

## 2024-06-14 NOTE — ED Triage Notes (Signed)
 Pt reports productive cough with green sputum x 05/25/24. Pt states that her oncologist put her on abx/steroids with improvement to symptoms after finishing course. Pt states that her symptoms returned on 06/11/24 worse than prior. Denies N/V/D/fever. Denies SOB. Denies Chest pain. Pt takes PO chemotherapy for breast cancer.

## 2024-06-14 NOTE — ED Notes (Signed)
 ED Provider at bedside.

## 2024-06-15 ENCOUNTER — Other Ambulatory Visit (HOSPITAL_BASED_OUTPATIENT_CLINIC_OR_DEPARTMENT_OTHER): Payer: Self-pay

## 2024-06-18 ENCOUNTER — Other Ambulatory Visit: Payer: Self-pay

## 2024-06-18 ENCOUNTER — Encounter (HOSPITAL_BASED_OUTPATIENT_CLINIC_OR_DEPARTMENT_OTHER): Payer: Self-pay

## 2024-06-18 ENCOUNTER — Emergency Department (HOSPITAL_BASED_OUTPATIENT_CLINIC_OR_DEPARTMENT_OTHER): Payer: Medicare (Managed Care)

## 2024-06-18 ENCOUNTER — Emergency Department (HOSPITAL_BASED_OUTPATIENT_CLINIC_OR_DEPARTMENT_OTHER)
Admission: EM | Admit: 2024-06-18 | Discharge: 2024-06-18 | Disposition: A | Discharge status: Home / Self Care | Payer: Medicare (Managed Care) | Source: Ambulatory Visit | Attending: Emergency Medicine | Admitting: Emergency Medicine

## 2024-06-18 DIAGNOSIS — R600 Localized edema: Secondary | ICD-10-CM | POA: Diagnosis not present

## 2024-06-18 DIAGNOSIS — R052 Subacute cough: Secondary | ICD-10-CM | POA: Diagnosis present

## 2024-06-18 DIAGNOSIS — Z853 Personal history of malignant neoplasm of breast: Secondary | ICD-10-CM | POA: Diagnosis not present

## 2024-06-18 DIAGNOSIS — J90 Pleural effusion, not elsewhere classified: Secondary | ICD-10-CM | POA: Diagnosis not present

## 2024-06-18 DIAGNOSIS — Z7982 Long term (current) use of aspirin: Secondary | ICD-10-CM | POA: Diagnosis not present

## 2024-06-18 NOTE — ED Notes (Signed)
 ED Provider at bedside.

## 2024-06-18 NOTE — ED Provider Notes (Signed)
 [place] EMERGENCY DEPARTMENT AT [facility] [place] Provider Note   CSN: [id number] Arrival date & time: 06/18/24  1133     Patient presents with: Follow-up   Karina White is a 68 y.o. female.   68 year old female with history of breast cancer and recurrent right-sided pleural effusion who presents to the emergency department for checkup.  Was seen here on 06/14/2024 for cough.  Had a chest x-ray that showed recurrent effusion with atelectasis versus pneumonia.  Was started on Levaquin and said she is feeling better.  No fevers or chills.  Still has a mild lingering cough.  No shortness of breath.  Had a thoracentesis most recently in August which did not show any malignant cells showed chronic inflammatory mesothelial cells.       Prior to Admission medications   Medication Sig Start Date End Date Taking? Authorizing Provider  amLODipine  (NORVASC ) 10 MG tablet Take 1 tablet (10 mg total) by mouth daily. 06/19/23   Sherrill Cable Latif, DO  aspirin  EC 81 MG tablet Take 81 mg by mouth daily. 01/01/2024 States takes every other day.    [provider]  carvedilol  (COREG ) 3.125 MG tablet Take 1 tablet (3.125 mg total) by mouth 2 (two) times daily with a meal. 11/19/23   Ennever, Maude SAUNDERS, MD  chlorhexidine  (PERIDEX ) 0.12 % solution Rinse mouth with 15 mLs (1 capful) 2 (two) times daily after brushing teeth. Swish for 30 seconds then spit out. 05/12/24     clindamycin  (CLEOCIN ) 150 MG capsule Take 1 capsule (150 mg total) by mouth 3 (three) times daily. 05/12/24     hydrochlorothiazide  (HYDRODIURIL ) 25 MG tablet Take 1 tablet (25 mg total) by mouth daily. 03/12/24   Timmy Maude SAUNDERS, MD  HYDROcodone -acetaminophen  (NORCO/VICODIN) 5-325 MG tablet Take 1 tablet by mouth every 6 (six) hours as needed. **Causes drowsiness** 05/12/24     letrozole  (FEMARA ) 2.5 MG tablet Take 1 tablet (2.5 mg total) by mouth daily. 02/28/24   Timmy Maude SAUNDERS, MD  levofloxacin (LEVAQUIN) 750 MG tablet Take 1  tablet (750 mg total) by mouth daily for 5 days. 06/14/24 06/20/24  Soto, Johana, PA-C  LORazepam  (ATIVAN ) 2 MG tablet Take 1 tablet (2 mg total) by mouth 1 hour pre-op with small sip of water . 05/12/24     methylPREDNISolone  (MEDROL  DOSEPAK) 4 MG TBPK tablet Take as directed in package for 6 days. 05/25/24   Timmy Maude SAUNDERS, MD  Multiple Vitamins-Minerals (CENTRUM ADULTS PO) Take 1 tablet by mouth daily.    [provider]  Omega 3 1200 MG CAPS Take 1,200 mg by mouth daily.    [provider]  omeprazole (PRILOSEC) 10 MG capsule Take 10 mg by mouth daily.    [provider]  ondansetron  (ZOFRAN -ODT) 4 MG disintegrating tablet Take 1 tablet (4 mg total) by mouth and dissolve on the tongue every 8 (eight) hours as needed for nausea and vomiting. 05/12/24     potassium chloride  SA (KLOR-CON  M) 20 MEQ tablet Take 1 tablet (20 mEq total) by mouth every other day. 03/12/24   Timmy Maude SAUNDERS, MD  ribociclib  succ (KISQALI , 600 MG DOSE,) 200 MG Therapy Pack Take 3 tablets (600 mg total) by mouth daily. Take for 21 days on, 7 days off, repeat every 28 days. 03/12/24   Timmy Maude SAUNDERS, MD  telmisartan  (MICARDIS ) 40 MG tablet Take 1 tablet (40 mg total) by mouth daily. 04/30/24   Timmy Maude SAUNDERS, MD  traMADol  (ULTRAM ) 50 MG tablet  Take 1 tablet (50 mg total) by mouth every 4 (four) to 6 (six) hours as needed. 02/20/24     VITAMIN D, CHOLECALCIFEROL , PO Take 2,000 Units by mouth daily.    [provider]    Allergies: Penicillins, Codeine, and Penicillin g    Review of Systems  Updated Vital Signs BP (!) 151/67   Pulse 82   Temp 97.8 F (36.6 C) (Oral)   Resp 18   Ht 5' 7 (1.702 m)   Wt 86.2 kg   SpO2 98%   BMI 29.76 kg/m   Physical Exam Vitals and nursing note reviewed.  Constitutional:      General: She is not in acute distress.    Appearance: She is well-developed.  HENT:     Head: Normocephalic and atraumatic.     Right Ear: External ear normal.     Left  Ear: External ear normal.     Nose: Nose normal.  Eyes:     Extraocular Movements: Extraocular movements intact.     Conjunctiva/sclera: Conjunctivae normal.     Pupils: Pupils are equal, round, and reactive to light.  Cardiovascular:     Rate and Rhythm: Normal rate and regular rhythm.     Heart sounds: No murmur heard. Pulmonary:     Effort: Pulmonary effort is normal. No respiratory distress.     Comments: CTAB but diminished in the right base Musculoskeletal:     Cervical back: Normal range of motion and neck supple.     Right lower leg: Edema (Trace) present.     Left lower leg: Edema (Trace) present.  Skin:    General: Skin is warm and dry.  Neurological:     Mental Status: She is alert and oriented to person, place, and time. Mental status is at baseline.  Psychiatric:        Mood and Affect: Mood normal.     (all labs ordered are listed, but only abnormal results are displayed) Labs Reviewed - No data to display  EKG: None  Radiology: DG Chest 2 View Result Date: 06/18/2024 CLINICAL DATA:  Follow-up right pleural effusion. EXAM: CHEST - 2 VIEW COMPARISON:  Radiograph 06/14/2024, 05/08/2024, PET CT 04/27/2024 reviewed FINDINGS: Unchanged moderate right pleural effusion and basilar opacity. Stable heart size and mediastinal contours. No acute airspace disease. No pneumothorax. Stable pulmonary vasculature. No acute osseous findings. IMPRESSION: Unchanged moderate right pleural effusion and basilar opacity. Electronically Signed   By: Andrea Gasman M.D.   On: 06/18/2024 12:37     Procedures   Medications Ordered in the ED - No data to display  Clinical Course as of 06/18/24 1805  Thu Jun 18, 2024  1224 Discussed with IR who recommends putting in an outpatient order and they will schedule her for an outpatient thoracentesis [RP]    Clinical Course User Index [RP] Yolande Lamar BROCKS, MD                                 Medical Decision Making Amount and/or  Complexity of Data Reviewed Radiology: ordered.   68 year old female with history of breast cancer and recurrent right-sided pleural effusion who presents to the emergency department for checkup.  Initial Ddx:  Pleural effusion, pneumonia, sepsis, hypoxia  MDM/Course:  Patient presents emergency department for repeat evaluation after her visit several days ago for pneumonia.  She is on Levaquin for possible community-acquired pneumonia.  She has recurrent pleural  effusion on the right side that they saw on the x-ray as well.  She says she is doing much better and only symptom is a mild lingering cough.  No fevers or chills.  No shortness of breath.  Did obtain repeat x-ray today that does not show evidence of worsening effusion or pneumonia.  She seems to be recovering quite nicely.  Since I suspect that her cough likely is due to the effusion and some of her other symptoms did reach out to IR to have it drained as an outpatient.  Patient given information for the scheduling office as well.   This patient presents to the ED for concern of complaints listed in HPI, this involves an extensive number of treatment options, and is a complaint that carries with it a high risk of complications and morbidity. Disposition including potential need for admission considered.   Dispo: DC Home. Return precautions discussed including, but not limited to, those listed in the AVS. Allowed pt time to ask questions which were answered fully prior to dc.  Records reviewed Outpatient Clinic Notes I independently reviewed the following imaging with scope of interpretation limited to determining acute life threatening conditions related to emergency care: Chest x-ray and agree with the radiologist interpretation with the following exceptions: none I have reviewed the patients home medications and made adjustments as needed Consults: Interventional radiology Social Determinants of health:  Geriatric  Portions of  this note were generated with Scientist, clinical (histocompatibility and immunogenetics). Dictation errors may occur despite best attempts at proofreading.     Final diagnoses:  Subacute cough  Pleural effusion    ED Discharge Orders          Ordered    IR Radiologist Eval & Mgmt  Status:  Canceled        06/18/24 1224    US  THORACENTESIS ASP PLEURAL SPACE W/IMG GUIDE        06/18/24 1224               Yolande Lamar BROCKS, MD 06/18/24 1807

## 2024-06-18 NOTE — ED Notes (Signed)
 Discharge paperwork reviewed entirely with patient, including follow up care. Pain was under control. No prescriptions were called in, but all questions were addressed.  Pt verbalized understanding as well as all parties involved. No questions or concerns voiced at the time of discharge. No acute distress noted. Pt was encouraged to stay adequately hydrated and eat a healthy diet.   Pt ambulated out to PVA without incident or assistance.  Pt advised they will notify their PCP immediately. and Pt advised they will seek followup care with a specialist and followup with their PCP.   The pt was instructed to set up and/or review MyChart for their results; and was informed their Providers all have access to the information as well.

## 2024-06-18 NOTE — Discharge Instructions (Signed)
 You were seen for your cough in the emergency department.  There are no signs of worsening pneumonia.  At home, please continue your antibiotics.    Check your MyChart online for the results of any tests that had not resulted by the time you left the emergency department.   Follow-up with your primary doctor in 2-3 days regarding your visit.  Interventional radiology will call you about having your pleural effusion drained again in several days.  If you do not hear from them give them a call with the information on this packet  Return immediately to the emergency department if you experience any of the following: Fevers, chills, difficulty breathing, or any other concerning symptoms.    Thank you for visiting our Emergency Department. It was a pleasure taking care of you today.

## 2024-06-18 NOTE — ED Triage Notes (Signed)
 Pt states that she was seen on Sunday and is here for a re-evaluation and chest xray. States that she is feeling better.

## 2024-06-18 NOTE — ED Notes (Signed)
 Pt came back in for PNA and wants eval for same. Today is her last day of antbx.

## 2024-06-19 ENCOUNTER — Encounter: Payer: Self-pay | Admitting: Hematology & Oncology

## 2024-06-19 ENCOUNTER — Inpatient Hospital Stay: Payer: Medicare (Managed Care)

## 2024-06-19 ENCOUNTER — Ambulatory Visit (HOSPITAL_COMMUNITY)
Admission: RE | Admit: 2024-06-19 | Discharge: 2024-06-19 | Disposition: A | Discharge status: Home / Self Care | Payer: Medicare (Managed Care) | Source: Ambulatory Visit | Attending: Emergency Medicine | Admitting: Emergency Medicine

## 2024-06-19 ENCOUNTER — Ambulatory Visit (HOSPITAL_COMMUNITY)
Admission: RE | Admit: 2024-06-19 | Discharge: 2024-06-19 | Disposition: A | Discharge status: Home / Self Care | Payer: Medicare (Managed Care) | Source: Ambulatory Visit | Attending: Radiology | Admitting: Radiology

## 2024-06-19 ENCOUNTER — Inpatient Hospital Stay: Payer: Medicare (Managed Care) | Admitting: Hematology & Oncology

## 2024-06-19 VITALS — BP 127/67 | HR 60 | Temp 98.7°F | Resp 20 | Ht 67.0 in | Wt 199.0 lb

## 2024-06-19 VITALS — BP 131/69

## 2024-06-19 DIAGNOSIS — Z1731 Human epidermal growth factor receptor 2 positive status: Secondary | ICD-10-CM | POA: Diagnosis not present

## 2024-06-19 DIAGNOSIS — C50919 Malignant neoplasm of unspecified site of unspecified female breast: Secondary | ICD-10-CM

## 2024-06-19 DIAGNOSIS — Z79811 Long term (current) use of aromatase inhibitors: Secondary | ICD-10-CM | POA: Diagnosis not present

## 2024-06-19 DIAGNOSIS — J90 Pleural effusion, not elsewhere classified: Secondary | ICD-10-CM

## 2024-06-19 DIAGNOSIS — C7951 Secondary malignant neoplasm of bone: Secondary | ICD-10-CM

## 2024-06-19 DIAGNOSIS — Z9889 Other specified postprocedural states: Secondary | ICD-10-CM | POA: Insufficient documentation

## 2024-06-19 DIAGNOSIS — J948 Other specified pleural conditions: Secondary | ICD-10-CM | POA: Insufficient documentation

## 2024-06-19 DIAGNOSIS — Z1721 Progesterone receptor positive status: Secondary | ICD-10-CM | POA: Diagnosis not present

## 2024-06-19 DIAGNOSIS — Z853 Personal history of malignant neoplasm of breast: Secondary | ICD-10-CM | POA: Insufficient documentation

## 2024-06-19 DIAGNOSIS — Z170 Estrogen receptor positive status [ER+]: Secondary | ICD-10-CM | POA: Diagnosis not present

## 2024-06-19 DIAGNOSIS — C50911 Malignant neoplasm of unspecified site of right female breast: Secondary | ICD-10-CM | POA: Diagnosis not present

## 2024-06-19 DIAGNOSIS — C782 Secondary malignant neoplasm of pleura: Secondary | ICD-10-CM | POA: Diagnosis not present

## 2024-06-19 LAB — CMP (CANCER CENTER ONLY)
ALT: 17 U/L (ref 0–44)
AST: 24 U/L (ref 15–41)
Albumin: 4 g/dL (ref 3.5–5.0)
Alkaline Phosphatase: 99 U/L (ref 38–126)
Anion gap: 12 (ref 5–15)
BUN: 14 mg/dL (ref 8–23)
CO2: 26 mmol/L (ref 22–32)
Calcium: 9.3 mg/dL (ref 8.9–10.3)
Chloride: 103 mmol/L (ref 98–111)
Creatinine: 1.5 mg/dL — ABNORMAL HIGH (ref 0.44–1.00)
GFR, Estimated: 37 mL/min — ABNORMAL LOW (ref >60)
Glucose, Bld: 89 mg/dL (ref 70–99)
Potassium: 4 mmol/L (ref 3.5–5.1)
Sodium: 141 mmol/L (ref 135–145)
Total Bilirubin: 0.5 mg/dL (ref 0.0–1.2)
Total Protein: 6.9 g/dL (ref 6.5–8.1)

## 2024-06-19 LAB — CBC WITH DIFFERENTIAL (CANCER CENTER ONLY)
Abs Immature Granulocytes: 0.01 K/uL (ref 0.00–0.07)
Basophils Absolute: 0 K/uL (ref 0.0–0.1)
Basophils Relative: 1 %
Eosinophils Absolute: 0 K/uL (ref 0.0–0.5)
Eosinophils Relative: 2 %
HCT: 29.1 % — ABNORMAL LOW (ref 36.0–46.0)
Hemoglobin: 10.1 g/dL — ABNORMAL LOW (ref 12.0–15.0)
Immature Granulocytes: 0 %
Lymphocytes Relative: 54 %
Lymphs Abs: 1.4 K/uL (ref 0.7–4.0)
MCH: 32.6 pg (ref 26.0–34.0)
MCHC: 34.7 g/dL (ref 30.0–36.0)
MCV: 93.9 fL (ref 80.0–100.0)
Monocytes Absolute: 0.2 K/uL (ref 0.1–1.0)
Monocytes Relative: 7 %
Neutro Abs: 0.9 K/uL — ABNORMAL LOW (ref 1.7–7.7)
Neutrophils Relative %: 36 %
Platelet Count: 319 K/uL (ref 150–400)
RBC: 3.1 MIL/uL — ABNORMAL LOW (ref 3.87–5.11)
RDW: 14.1 % (ref 11.5–15.5)
Smear Review: NORMAL
WBC Count: 2.5 K/uL — ABNORMAL LOW (ref 4.0–10.5)
nRBC: 0 % (ref 0.0–0.2)

## 2024-06-19 MED ORDER — LIDOCAINE-EPINEPHRINE (PF) 2 %-1:200000 IJ SOLN
INTRAMUSCULAR | Status: AC
  Filled 2024-06-19: qty 20

## 2024-06-19 NOTE — Progress Notes (Signed)
 Hematology and Oncology Follow Up Visit  Karina White [id number] 11-27-1955 68 y.o. 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  Tooth extraction on 02/20/2024     Interim History:  Ms. Ketchem is back for follow-up.  She is still having some problems with a pleural effusion.  She was in the ER this past weekend.  She had a chest x-ray which showed the right pleural effusion.  She has had a thoracentesis about 4 or 5 times.  I am tired of getting her to have a thoracentesis.  I think at this point, we really need to see about a procedure to scar up the pleural lining.  I will see about referring her to Thoracic Surgery.  I think that she probably needs to have a thoracoscopy and then see about the possibility of a pleural irritant to try to decrease the reaccumulation.  We have checked the pleural fluid several times and everything has been negative.  Her breast cancer has been doing quite well.  Her last CA 27.29 was 60.  She is doing well on the Femara  and Kisquali.  She has had no problems with nausea or vomiting.  There is been no change in bowel or bladder habits.  She has had no rashes.  There is been no bleeding.  Overall, I would say that her performance status is probably ECOG 1.   Medications:  Current Outpatient Medications:    aspirin  EC 81 MG tablet, Take 81 mg by mouth daily. 01/01/2024 States takes every other day., Disp: , Rfl:    carvedilol  (COREG ) 3.125 MG tablet, Take 1 tablet (3.125 mg total) by mouth 2 (two) times daily with a meal., Disp: 60 tablet, Rfl: 6   hydrochlorothiazide  (HYDRODIURIL ) 25 MG tablet, Take 1 tablet (25 mg total) by mouth daily., Disp: 30 tablet, Rfl: 6    letrozole  (FEMARA ) 2.5 MG tablet, Take 1 tablet (2.5 mg total) by mouth daily., Disp: 30 tablet, Rfl: 6   Multiple Vitamins-Minerals (CENTRUM ADULTS PO), Take 1 tablet by mouth daily., Disp: , Rfl:    Omega 3 1200 MG CAPS, Take 1,200 mg by mouth daily., Disp: , Rfl:    omeprazole (PRILOSEC) 10 MG capsule, Take 10 mg by mouth daily., Disp: , Rfl:    ondansetron  (ZOFRAN -ODT) 4 MG disintegrating tablet, Take 1 tablet (4 mg total) by mouth and dissolve on the tongue every 8 (eight) hours as needed for nausea and vomiting., Disp: 6 tablet, Rfl: 0   potassium chloride  SA (KLOR-CON  M) 20 MEQ tablet, Take 1 tablet (20 mEq total) by mouth every other day., Disp: 30 tablet, Rfl: 6   ribociclib  succ (KISQALI , 600 MG DOSE,) 200 MG Therapy Pack, Take 3 tablets (600 mg total) by mouth daily. Take for 21 days on, 7 days off, repeat every 28 days., Disp: 63 tablet, Rfl: 3   telmisartan  (MICARDIS ) 40 MG tablet, Take 1 tablet (40 mg total) by mouth daily., Disp: 90 tablet, Rfl: 1   VITAMIN D, CHOLECALCIFEROL , PO, Take 2,000 Units by mouth daily., Disp: , Rfl:    amLODipine  (NORVASC ) 10 MG tablet, Take 1 tablet (10 mg total) by mouth daily., Disp: 30 tablet, Rfl: 0  Allergies:  Allergies  Allergen Reactions   Penicillins Hives and Itching   Codeine Nausea And Vomiting and Other (See Comments)   Penicillin G Other (See Comments)    Past Medical History, Surgical history, Social history, and Family History were reviewed and updated.  Review of Systems: Review of Systems  Constitutional: Negative.  Negative for appetite change and unexpected weight change.  HENT:  Negative.    Eyes: Negative.   Respiratory:  Positive for shortness of breath.   Gastrointestinal: Negative.   Endocrine: Negative.   Genitourinary: Negative.    Musculoskeletal:  Positive for back pain.  Skin: Negative.   Neurological: Negative.   Hematological: Negative.   Psychiatric/Behavioral: Negative.      Physical Exam:  Vital  signs show temperature of 98.7.  Pulse 60.  Blood pressure 127/67.  Weight is 199 pounds.        Wt Readings from Last 3 Encounters:  06/19/24 199 lb (90.3 kg)  06/18/24 190 lb 0.6 oz (86.2 kg)  06/14/24 190 lb (86.2 kg)    Physical Exam Vitals reviewed.  Constitutional:      Comments: Her breast exam shows right breast with no masses, edema or erythema.  There is no right axillary adenopathy.  Left breast does not show any obvious mass.  There may be a little bit of firmness at the 12 o'clock position.  I cannot palpate any left axillary adenopathy.    HENT:     Head: Normocephalic and atraumatic.  Eyes:     Pupils: Pupils are equal, round, and reactive to light.  Cardiovascular:     Rate and Rhythm: Normal rate and regular rhythm.     Heart sounds: Normal heart sounds.  Pulmonary:     Effort: Pulmonary effort is normal.     Breath sounds: Normal breath sounds.  Abdominal:     General: Bowel sounds are normal.     Palpations: Abdomen is soft.     Comments: Abdominal exam is soft.  She has decent bowel sounds.  There is no fluid wave.  There is no palpable liver or spleen tip.  Musculoskeletal:        General: No tenderness or deformity. Normal range of motion.     Cervical back: Normal range of motion.     Comments: Back exam shows some slight tenderness to palpation over the thoracic and lumbar spine.  Lymphadenopathy:     Cervical: No cervical adenopathy.  Skin:    General: Skin is warm and dry.     Findings: No erythema or rash.  Neurological:     Mental Status: She is alert and oriented to person, place, and time.  Psychiatric:        Behavior: Behavior normal.        Thought Content: Thought content normal.        Judgment: Judgment normal.      Lab Results  Component Value Date   WBC 2.5 (L) 06/19/2024   HGB 10.1 (L) 06/19/2024   HCT 29.1 (L) 06/19/2024   MCV 93.9 06/19/2024   PLT 319 06/19/2024     Chemistry      Component Value Date/Time   NA 141  06/14/2024 1046   K 4.3 06/14/2024 1046   CL 104 06/14/2024 1046   CO2 26 06/14/2024 1046   BUN 13 06/14/2024 1046   CREATININE 1.56 (H) 06/14/2024 1046   CREATININE 1.39 (H) 05/08/2024 0828      Component Value Date/Time   CALCIUM 9.3 06/14/2024  1046   ALKPHOS 100 06/14/2024 1046   AST 26 06/14/2024 1046   AST 27 05/08/2024 0828   ALT 20 06/14/2024 1046   ALT 18 05/08/2024 0828   BILITOT 0.6 06/14/2024 1046   BILITOT 0.3 05/08/2024 0828      Impression and Plan: Ms. Wheeland is a very nice 68 year old Afro-American female.  She has stage IV adenocarcinoma of the breast.  Thankfully, her tumor is ER/PR positive.  It is low HER2 positive.  We currently have her on antiestrogen therapy.  I think that the antiestrogen therapy is doing a great job.    Again this pleural effusion is really aggravation.  We really need to see about getting this stopped.  We will see if Thoracic Surgery can help us  out.  I will make a referral to Dr. Kerrin.  Again, all the time we have checked the fluid is been negative for malignancy.  There has been inflammatory cells.  We will see if Dr. Kerrin can help us  out.  We will do another PET scan on her sometime in November.  She will continue on the Femara /Kisquali for right now.  I will plan to see her back probably in 6 weeks or so.   Maude JONELLE Crease, MD 10/10/20259:02 AM

## 2024-06-19 NOTE — Procedures (Signed)
 Ultrasound-guided diagnostic and therapeutic right thoracentesis performed yielding 870 cc of yellow  fluid. No immediate complications. Follow-up chest x-ray pending. A portion of the fluid was sent to the lab for cytology. EBL none.

## 2024-06-20 ENCOUNTER — Ambulatory Visit: Payer: Self-pay | Admitting: Hematology & Oncology

## 2024-06-20 LAB — CANCER ANTIGEN 27.29: CA 27.29: 41.5 U/mL — ABNORMAL HIGH (ref 0.0–38.6)

## 2024-06-22 ENCOUNTER — Encounter: Payer: Self-pay | Admitting: *Deleted

## 2024-06-22 ENCOUNTER — Other Ambulatory Visit (HOSPITAL_BASED_OUTPATIENT_CLINIC_OR_DEPARTMENT_OTHER): Payer: Self-pay

## 2024-06-22 ENCOUNTER — Other Ambulatory Visit: Payer: Self-pay | Admitting: Hematology & Oncology

## 2024-06-22 ENCOUNTER — Telehealth: Payer: Self-pay | Admitting: Pharmacy Technician

## 2024-06-22 MED ORDER — CARVEDILOL 3.125 MG PO TABS
3.1250 mg | ORAL_TABLET | Freq: Two times a day (BID) | ORAL | 6 refills | Status: AC
  Filled 2024-06-22: qty 60, 30d supply, fill #0
  Filled 2024-07-20: qty 60, 30d supply, fill #1
  Filled 2024-08-19 (×2): qty 60, 30d supply, fill #2
  Filled 2024-09-18: qty 60, 30d supply, fill #3

## 2024-06-22 NOTE — Telephone Encounter (Signed)
 Patient called stating that [employer] bill did not reflect the payment from the [employer] of [phone].  [name] verified that check [id number] payable to [employer] for [phone] cleared [place]'s bank account on 05/22/24.  Coordinated a conference call with patient and [employer].  Spoke with Darice at [employer].  Darice stated that she would need a copy of the front and back of the cleared check in order to credit patient's account.  Obtained a copy from [name] and e-mailed to [employer].  It will take up to 5 business days to correct on patient's account.  Patient to contact [employer] in a week to follow-up.  Karina White Karina White Patient Pharmacologist [facility]

## 2024-06-23 ENCOUNTER — Other Ambulatory Visit (HOSPITAL_COMMUNITY): Payer: Self-pay

## 2024-06-23 LAB — CYTOLOGY - NON PAP

## 2024-06-30 ENCOUNTER — Other Ambulatory Visit (HOSPITAL_COMMUNITY): Payer: Self-pay

## 2024-07-13 ENCOUNTER — Inpatient Hospital Stay: Payer: Medicare (Managed Care) | Attending: Hematology & Oncology

## 2024-07-13 ENCOUNTER — Other Ambulatory Visit: Payer: Self-pay

## 2024-07-13 ENCOUNTER — Other Ambulatory Visit (HOSPITAL_BASED_OUTPATIENT_CLINIC_OR_DEPARTMENT_OTHER): Payer: Self-pay

## 2024-07-13 DIAGNOSIS — Z1731 Human epidermal growth factor receptor 2 positive status: Secondary | ICD-10-CM | POA: Insufficient documentation

## 2024-07-13 DIAGNOSIS — C50919 Malignant neoplasm of unspecified site of unspecified female breast: Secondary | ICD-10-CM | POA: Insufficient documentation

## 2024-07-13 DIAGNOSIS — J90 Pleural effusion, not elsewhere classified: Secondary | ICD-10-CM | POA: Insufficient documentation

## 2024-07-13 DIAGNOSIS — C7951 Secondary malignant neoplasm of bone: Secondary | ICD-10-CM | POA: Insufficient documentation

## 2024-07-13 DIAGNOSIS — C782 Secondary malignant neoplasm of pleura: Secondary | ICD-10-CM | POA: Insufficient documentation

## 2024-07-13 DIAGNOSIS — Z170 Estrogen receptor positive status [ER+]: Secondary | ICD-10-CM | POA: Insufficient documentation

## 2024-07-13 DIAGNOSIS — Z79811 Long term (current) use of aromatase inhibitors: Secondary | ICD-10-CM | POA: Insufficient documentation

## 2024-07-13 DIAGNOSIS — Z1721 Progesterone receptor positive status: Secondary | ICD-10-CM | POA: Insufficient documentation

## 2024-07-13 DIAGNOSIS — N289 Disorder of kidney and ureter, unspecified: Secondary | ICD-10-CM | POA: Insufficient documentation

## 2024-07-13 NOTE — Progress Notes (Signed)
 [facility] CSW Progress Note  Clinical Child Psychotherapist received call from patient regarding food pantry availability items.  .    Interventions: CSW contacted Shona Maclachlan at [facility] to verify food items that patient wished to pick up.  Called patient back and informed her the food item was available for pick up.      Follow Up Plan:  Patient will contact CSW with any support or resource needs    Macario CHRISTELLA Au, LCSW Clinical Social Worker [facility]

## 2024-07-17 ENCOUNTER — Other Ambulatory Visit: Payer: Self-pay | Admitting: *Deleted

## 2024-07-17 DIAGNOSIS — C50919 Malignant neoplasm of unspecified site of unspecified female breast: Secondary | ICD-10-CM

## 2024-07-17 MED ORDER — RIBOCICLIB SUCC (600 MG DOSE) 200 MG PO TBPK: 600.0000 mg | ORAL_TABLET | Freq: Every day | ORAL | 3 refills | Status: DC

## 2024-07-20 ENCOUNTER — Encounter (HOSPITAL_COMMUNITY)
Admission: RE | Admit: 2024-07-20 | Discharge: 2024-07-20 | Disposition: A | Discharge status: Home / Self Care | Payer: Medicare (Managed Care) | Source: Ambulatory Visit | Attending: Hematology & Oncology | Admitting: Hematology & Oncology

## 2024-07-20 DIAGNOSIS — R933 Abnormal findings on diagnostic imaging of other parts of digestive tract: Secondary | ICD-10-CM | POA: Diagnosis not present

## 2024-07-20 DIAGNOSIS — J90 Pleural effusion, not elsewhere classified: Secondary | ICD-10-CM | POA: Insufficient documentation

## 2024-07-20 DIAGNOSIS — I2510 Atherosclerotic heart disease of native coronary artery without angina pectoris: Secondary | ICD-10-CM | POA: Diagnosis not present

## 2024-07-20 DIAGNOSIS — I700 Atherosclerosis of aorta: Secondary | ICD-10-CM | POA: Diagnosis not present

## 2024-07-20 DIAGNOSIS — C7951 Secondary malignant neoplasm of bone: Secondary | ICD-10-CM | POA: Insufficient documentation

## 2024-07-20 DIAGNOSIS — K449 Diaphragmatic hernia without obstruction or gangrene: Secondary | ICD-10-CM | POA: Diagnosis not present

## 2024-07-20 DIAGNOSIS — C50919 Malignant neoplasm of unspecified site of unspecified female breast: Secondary | ICD-10-CM | POA: Diagnosis present

## 2024-07-20 LAB — GLUCOSE, CAPILLARY: Glucose-Capillary: 94 mg/dL (ref 70–99)

## 2024-07-20 MED ORDER — FLUDEOXYGLUCOSE F - 18 (FDG) INJECTION
9.8900 | Freq: Once | INTRAVENOUS | Status: AC
  Administered 2024-07-20: 9.89 via INTRAVENOUS

## 2024-07-22 ENCOUNTER — Ambulatory Visit: Payer: Self-pay | Admitting: Hematology & Oncology

## 2024-07-27 ENCOUNTER — Other Ambulatory Visit (HOSPITAL_COMMUNITY): Payer: Self-pay

## 2024-08-05 ENCOUNTER — Inpatient Hospital Stay: Payer: Medicare (Managed Care) | Admitting: Hematology & Oncology

## 2024-08-05 ENCOUNTER — Inpatient Hospital Stay: Payer: Medicare (Managed Care)

## 2024-08-05 ENCOUNTER — Encounter: Payer: Self-pay | Admitting: Hematology & Oncology

## 2024-08-05 VITALS — BP 130/62 | HR 61 | Temp 98.2°F | Resp 18 | Ht 67.0 in | Wt 206.4 lb

## 2024-08-05 DIAGNOSIS — C50919 Malignant neoplasm of unspecified site of unspecified female breast: Secondary | ICD-10-CM

## 2024-08-05 DIAGNOSIS — Z1731 Human epidermal growth factor receptor 2 positive status: Secondary | ICD-10-CM | POA: Diagnosis not present

## 2024-08-05 DIAGNOSIS — J90 Pleural effusion, not elsewhere classified: Secondary | ICD-10-CM | POA: Diagnosis not present

## 2024-08-05 DIAGNOSIS — Z1721 Progesterone receptor positive status: Secondary | ICD-10-CM | POA: Diagnosis not present

## 2024-08-05 DIAGNOSIS — N289 Disorder of kidney and ureter, unspecified: Secondary | ICD-10-CM | POA: Diagnosis not present

## 2024-08-05 DIAGNOSIS — C7951 Secondary malignant neoplasm of bone: Secondary | ICD-10-CM | POA: Diagnosis not present

## 2024-08-05 DIAGNOSIS — C782 Secondary malignant neoplasm of pleura: Secondary | ICD-10-CM | POA: Diagnosis not present

## 2024-08-05 DIAGNOSIS — Z79811 Long term (current) use of aromatase inhibitors: Secondary | ICD-10-CM | POA: Diagnosis not present

## 2024-08-05 DIAGNOSIS — Z170 Estrogen receptor positive status [ER+]: Secondary | ICD-10-CM | POA: Diagnosis not present

## 2024-08-05 LAB — CBC WITH DIFFERENTIAL (CANCER CENTER ONLY)
Abs Immature Granulocytes: 0 K/uL (ref 0.00–0.07)
Basophils Absolute: 0.1 K/uL (ref 0.0–0.1)
Basophils Relative: 2 %
Eosinophils Absolute: 0 K/uL (ref 0.0–0.5)
Eosinophils Relative: 1 %
HCT: 31.1 % — ABNORMAL LOW (ref 36.0–46.0)
Hemoglobin: 10.5 g/dL — ABNORMAL LOW (ref 12.0–15.0)
Immature Granulocytes: 0 %
Lymphocytes Relative: 52 %
Lymphs Abs: 1.7 K/uL (ref 0.7–4.0)
MCH: 31.6 pg (ref 26.0–34.0)
MCHC: 33.8 g/dL (ref 30.0–36.0)
MCV: 93.7 fL (ref 80.0–100.0)
Monocytes Absolute: 0.3 K/uL (ref 0.1–1.0)
Monocytes Relative: 9 %
Neutro Abs: 1.2 K/uL — ABNORMAL LOW (ref 1.7–7.7)
Neutrophils Relative %: 36 %
Platelet Count: 185 K/uL (ref 150–400)
RBC: 3.32 MIL/uL — ABNORMAL LOW (ref 3.87–5.11)
RDW: 14.1 % (ref 11.5–15.5)
Smear Review: NORMAL
WBC Count: 3.3 K/uL — ABNORMAL LOW (ref 4.0–10.5)
nRBC: 0 % (ref 0.0–0.2)

## 2024-08-05 LAB — CMP (CANCER CENTER ONLY)
ALT: 16 U/L (ref 0–44)
AST: 25 U/L (ref 15–41)
Albumin: 4.4 g/dL (ref 3.5–5.0)
Alkaline Phosphatase: 91 U/L (ref 38–126)
Anion gap: 11 (ref 5–15)
BUN: 16 mg/dL (ref 8–23)
CO2: 27 mmol/L (ref 22–32)
Calcium: 9.7 mg/dL (ref 8.9–10.3)
Chloride: 105 mmol/L (ref 98–111)
Creatinine: 1.71 mg/dL — ABNORMAL HIGH (ref 0.44–1.00)
GFR, Estimated: 32 mL/min — ABNORMAL LOW (ref >60)
Glucose, Bld: 97 mg/dL (ref 70–99)
Potassium: 3.9 mmol/L (ref 3.5–5.1)
Sodium: 143 mmol/L (ref 135–145)
Total Bilirubin: 0.6 mg/dL (ref 0.0–1.2)
Total Protein: 7.1 g/dL (ref 6.5–8.1)

## 2024-08-05 NOTE — Progress Notes (Signed)
 Hematology and Oncology Follow Up Visit  Karina White [id number] 1955-12-20 68 y.o. 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  Tooth extraction on 02/20/2024     Interim History:  Karina White is back for follow-up.  Karina White is doing quite well.  I know that Karina White will have a wonderful Thanksgiving holiday.  We last saw her back in October.  We did do a PET scan on her.  This was done on 07/20/2024.  The PET scan did not show any active metabolic disease.  Karina White had treated osseous metastasis.  Karina White had a moderate right pleural effusion.  Karina White has had thoracentesis on several occasions.  So far, the thoracentesis have never shown fluid to be malignant.  I spoke with Thoracic Surgery.  They do not think that Karina White needs to have any type of pleurodesis done.  Karina White has not had any real shortness of breath.  Her appetite is doing well.  Karina White does have some renal insufficiency.  I suspect this might be from the diuretics that Karina White is on.  I told her to stop the hydrochlorothiazide .  Maybe this will help.  Know that the Glena has a very low incidence of pleural effusions.  However, if this effusion continues to be a problem, then we may switch the Kisquali to another CDK4/CDK6 inhibitor.  Her last CA 27.29 was still decreasing at 41.    There is been no problems with pain.  Karina White has had no cough.  Karina White has had no headache.  Overall, I would say that her performance status is probably ECOG 1.     Medications:  Current Outpatient Medications:    amLODipine  (NORVASC ) 10 MG tablet, Take 1 tablet (10 mg total) by mouth daily., Disp: 30 tablet, Rfl: 0   aspirin  EC 81 MG tablet, Take 81 mg by mouth daily. 01/01/2024  States takes every other day., Disp: , Rfl:    carvedilol  (COREG ) 3.125 MG tablet, Take 1 tablet (3.125 mg total) by mouth 2 (two) times daily with a meal., Disp: 60 tablet, Rfl: 6   hydrochlorothiazide  (HYDRODIURIL ) 25 MG tablet, Take 1 tablet (25 mg total) by mouth daily., Disp: 30 tablet, Rfl: 6   letrozole  (FEMARA ) 2.5 MG tablet, Take 1 tablet (2.5 mg total) by mouth daily., Disp: 30 tablet, Rfl: 6   Multiple Vitamins-Minerals (CENTRUM ADULTS PO), Take 1 tablet by mouth daily., Disp: , Rfl:    Omega 3 1200 MG CAPS, Take 1,200 mg by mouth daily., Disp: , Rfl:    omeprazole (PRILOSEC) 10 MG capsule, Take 10 mg by mouth daily., Disp: , Rfl:    ondansetron  (ZOFRAN -ODT) 4 MG disintegrating tablet, Take 1 tablet (4 mg total) by mouth and dissolve on the tongue every 8 (eight) hours as needed for nausea and vomiting., Disp: 6 tablet, Rfl: 0   potassium chloride  SA (KLOR-CON  M) 20 MEQ tablet, Take 1 tablet (20 mEq total) by mouth every other day., Disp: 30 tablet, Rfl: 6   ribociclib  succ (KISQALI , 600 MG DOSE,) 200 MG Therapy Pack, Take 3 tablets (600 mg total) by mouth daily. Take for 21 days on, 7 days off, repeat every 28 days., Disp: 63 tablet, Rfl: 3  telmisartan  (MICARDIS ) 40 MG tablet, Take 1 tablet (40 mg total) by mouth daily., Disp: 90 tablet, Rfl: 1   VITAMIN D, CHOLECALCIFEROL , PO, Take 2,000 Units by mouth daily., Disp: , Rfl:   Allergies:  Allergies  Allergen Reactions   Codeine Nausea And Vomiting   Penicillins Hives and Itching   Penicillin G Other (See Comments)    Past Medical History, Surgical history, Social history, and Family History were reviewed and updated.  Review of Systems: Review of Systems  Constitutional: Negative.  Negative for appetite change and unexpected weight change.  HENT:  Negative.    Eyes: Negative.   Respiratory:  Positive for shortness of breath.   Gastrointestinal: Negative.   Endocrine: Negative.   Genitourinary: Negative.     Musculoskeletal:  Positive for back pain.  Skin: Negative.   Neurological: Negative.   Hematological: Negative.   Psychiatric/Behavioral: Negative.      Physical Exam:  Vital signs show temperature of 98.2.  Pulse 61.  Blood pressure 130/62.  Weight is 206 pounds.         Wt Readings from Last 3 Encounters:  06/19/24 199 lb (90.3 kg)  06/18/24 190 lb 0.6 oz (86.2 kg)  06/14/24 190 lb (86.2 kg)    Physical Exam Vitals reviewed.  Constitutional:      Comments: Her breast exam shows right breast with no masses, edema or erythema.  There is no right axillary adenopathy.  Left breast does not show any obvious mass.  There may be a little bit of firmness at the 12 o'clock position.  I cannot palpate any left axillary adenopathy.    HENT:     Head: Normocephalic and atraumatic.  Eyes:     Pupils: Pupils are equal, round, and reactive to light.  Cardiovascular:     Rate and Rhythm: Normal rate and regular rhythm.     Heart sounds: Normal heart sounds.  Pulmonary:     Effort: Pulmonary effort is normal.     Breath sounds: Normal breath sounds.  Abdominal:     General: Bowel sounds are normal.     Palpations: Abdomen is soft.     Comments: Abdominal exam is soft.  Karina White has decent bowel sounds.  There is no fluid wave.  There is no palpable liver or spleen tip.  Musculoskeletal:        General: No tenderness or deformity. Normal range of motion.     Cervical back: Normal range of motion.     Comments: Back exam shows some slight tenderness to palpation over the thoracic and lumbar spine.  Lymphadenopathy:     Cervical: No cervical adenopathy.  Skin:    General: Skin is warm and dry.     Findings: No erythema or rash.  Neurological:     Mental Status: Karina White is alert and oriented to person, place, and time.  Psychiatric:        Behavior: Behavior normal.        Thought Content: Thought content normal.        Judgment: Judgment normal.      Lab Results  Component Value Date    WBC 3.3 (L) 08/05/2024   HGB 10.5 (L) 08/05/2024   HCT 31.1 (L) 08/05/2024   MCV 93.7 08/05/2024   PLT 185 08/05/2024     Chemistry      Component Value Date/Time   NA 141 06/19/2024 0835   K 4.0 06/19/2024 0835   CL 103 06/19/2024 0835   CO2 26  06/19/2024 0835   BUN 14 06/19/2024 0835   CREATININE 1.50 (H) 06/19/2024 0835      Component Value Date/Time   CALCIUM 9.3 06/19/2024 0835   ALKPHOS 99 06/19/2024 0835   AST 24 06/19/2024 0835   ALT 17 06/19/2024 0835   BILITOT 0.5 06/19/2024 0835      Impression and Plan: Karina White is a very nice 68 year old Afro-American female.  Karina White has stage IV adenocarcinoma of the breast.  Thankfully, her tumor is ER/PR positive.  It is low HER2 positive.  We currently have her on antiestrogen therapy.  I think that the antiestrogen therapy is doing a great job.    Karina White has a moderate right pleural effusion.  I think that for right now, we can just follow this.  Karina White is not symptomatic with this.  I probably would do a chest x-ray when I see her back.  Hopefully, her renal function will be a little bit better.  I know that Karina White has an appointment with her family doctor in a month or so.  I am happy that Karina White is doing well that Karina White has a good quality of life.  Karina White has great support.  This really does help.  We will see her back in another month.   Maude JONELLE Crease, MD 11/26/20259:26 AM

## 2024-08-06 LAB — CANCER ANTIGEN 27.29: CA 27.29: 63.3 U/mL — ABNORMAL HIGH (ref 0.0–38.6)

## 2024-08-18 ENCOUNTER — Inpatient Hospital Stay: Payer: Medicare (Managed Care) | Attending: Hematology & Oncology

## 2024-08-18 DIAGNOSIS — Z1731 Human epidermal growth factor receptor 2 positive status: Secondary | ICD-10-CM | POA: Insufficient documentation

## 2024-08-18 DIAGNOSIS — C7951 Secondary malignant neoplasm of bone: Secondary | ICD-10-CM | POA: Insufficient documentation

## 2024-08-18 DIAGNOSIS — Z170 Estrogen receptor positive status [ER+]: Secondary | ICD-10-CM | POA: Insufficient documentation

## 2024-08-18 DIAGNOSIS — J90 Pleural effusion, not elsewhere classified: Secondary | ICD-10-CM | POA: Insufficient documentation

## 2024-08-18 DIAGNOSIS — C50919 Malignant neoplasm of unspecified site of unspecified female breast: Secondary | ICD-10-CM | POA: Insufficient documentation

## 2024-08-18 DIAGNOSIS — Z79811 Long term (current) use of aromatase inhibitors: Secondary | ICD-10-CM | POA: Insufficient documentation

## 2024-08-18 DIAGNOSIS — Z1721 Progesterone receptor positive status: Secondary | ICD-10-CM | POA: Insufficient documentation

## 2024-08-18 DIAGNOSIS — C782 Secondary malignant neoplasm of pleura: Secondary | ICD-10-CM | POA: Insufficient documentation

## 2024-08-18 NOTE — Progress Notes (Signed)
 [facility] CSW Progress Note  Clinical Child Psychotherapist received call from patient regarding help with utility assistance application.    Interventions: Patient stated she spoke with her DSS worker regarding utility assistance from [facility] and was told to contact CSW.  Will meet with patient to assist with application.      Follow Up Plan:  CSW will see patient on 12/10 at 10am.    Macario CHRISTELLA Au, LCSW Clinical Social Worker [facility]

## 2024-08-19 ENCOUNTER — Inpatient Hospital Stay: Payer: Medicare (Managed Care) | Admitting: Licensed Clinical Social Worker

## 2024-08-19 ENCOUNTER — Other Ambulatory Visit (HOSPITAL_BASED_OUTPATIENT_CLINIC_OR_DEPARTMENT_OTHER): Payer: Self-pay

## 2024-08-19 NOTE — Progress Notes (Signed)
 [facility] CSW Progress Note  Visual Merchandiser met with patient to follow-up on need for community resources.    Interventions: Began application for energy assistance with [employer].  Patient decided she did not wish to proceed, however.  CSW provided supportive counseling as patient expressed her financial difficulties.      Follow Up Plan:  Patient will contact CSW with any support or resource needs    Macario CHRISTELLA Au, LCSW Clinical Social Worker [facility]

## 2024-08-20 ENCOUNTER — Other Ambulatory Visit (HOSPITAL_COMMUNITY): Payer: Self-pay

## 2024-08-20 ENCOUNTER — Telehealth: Payer: Self-pay

## 2024-08-20 NOTE — Telephone Encounter (Signed)
 Oral Oncology Patient Advocate Encounter   Received notification that the application for assistance for Kisqali  through [employer] has been denied due to Needing income documentation or proof of extra help denial.    I have spoken to the patient.  Patient has applied for Extra Help through medicare, she will let me know the determination.   Status: Pending Extra Help determination    Charlott Hamilton,  CPhT-Adv  she/her/hers [facility]  [facility] Pharm[facility] Services Pharmacy Technician Patient Advocate Specialist III [facility] Phone: [phone]  Fax: [phone] [email or web address]   Oral Oncology Patient Advocate Encounter   Received notification that patient is due for re-enrollment for assistance for Kisqali  through [employer].   Re-enrollment process has been initiated and will be submitted upon completion of necessary documents. Re-enrollment submitted via fax   I will continue to follow until final determination.   Charlott Hamilton,  CPhT-Adv  she/her/hers [facility]  [facility] Specialty Pharmacy Services Pharmacy Technician Patient Advocate Specialist III [facility] Phone: [phone]  Fax: [phone] [email or web address]

## 2024-08-25 ENCOUNTER — Telehealth: Payer: Self-pay

## 2024-08-25 ENCOUNTER — Other Ambulatory Visit: Payer: Self-pay

## 2024-08-25 ENCOUNTER — Other Ambulatory Visit (HOSPITAL_BASED_OUTPATIENT_CLINIC_OR_DEPARTMENT_OTHER): Payer: Self-pay

## 2024-08-25 ENCOUNTER — Telehealth: Payer: Self-pay | Admitting: Pharmacist

## 2024-08-25 ENCOUNTER — Other Ambulatory Visit (HOSPITAL_COMMUNITY): Payer: Self-pay

## 2024-08-25 DIAGNOSIS — C50919 Malignant neoplasm of unspecified site of unspecified female breast: Secondary | ICD-10-CM

## 2024-08-25 MED ORDER — ONDANSETRON HCL 8 MG PO TABS
8.0000 mg | ORAL_TABLET | Freq: Three times a day (TID) | ORAL | 2 refills | Status: AC | PRN
  Filled 2024-08-25: qty 20, 6d supply, fill #0

## 2024-08-25 MED ORDER — ABEMACICLIB 150 MG PO TABS: 150.0000 mg | ORAL_TABLET | Freq: Two times a day (BID) | ORAL | 2 refills | Status: DC

## 2024-08-25 NOTE — Telephone Encounter (Signed)
 Oral Oncology Patient Advocate Encounter  After speaking with the patient she would like like to apply for PAP through [facility] for Verzenio .   No grant funding available at the time for breast cancer diagnosis.   Application has been sent to patient via [employer] and emailed to Dr. Timmy for signatures.   I will continue to follow up     Charlott Hamilton,  CPhT-Adv  she/her/hers [place]  [facility] Pharmacy Technician Patient Advocate Specialist III [facility] Phone: [phone]  Fax: [phone] [email or web address]

## 2024-08-25 NOTE — Telephone Encounter (Signed)
 Oral Oncology Patient Advocate Encounter   Received notification that prior authorization for Verzenio  is required.   PA submitted on 08/25/2024 Key [id number] Status is pending      Charlott Hamilton,  CPhT-Adv  she/her/hers [facility]  [facility] Specialty Pharmacy Services Pharmacy Technician Patient Advocate Specialist III [facility] Phone: [phone]  Fax: [phone] [email or web address]  '

## 2024-08-25 NOTE — Progress Notes (Signed)
 Pharmacy requested a RX for Zofran  8 mg PRN. ERX sent.

## 2024-08-25 NOTE — Telephone Encounter (Signed)
 Oral Chemotherapy Pharmacist Encounter  I spoke with patient for overview of: Verzenio  (abemaciclib ) for the treatment of metastatic breast cancer, ER/PR positive, HER-2 negative in conjunction with letrozole , planned duration until disease progression or unacceptable drug toxicity.   Treatment goal: Palliative  Counseled patient on administration, dosing, side effects, monitoring, drug-food interactions, safe handling, storage, and disposal.  Patient will take Verzenio  150mg  tablets, 1 tablet by mouth twice daily without regard to food.  Patient knows to avoid grapefruit and grapefruit juice.  Verzenio  start date: 08/29/24  Adverse effects include but are not limited to: diarrhea, fatigue, nausea, abdominal pain, decreased blood counts, and increased liver function tests, and joint pains.  Diarrhea: Patient will obtain anti diarrheal and alert the office of 4 or more loose stools above baseline. Other diarrhea management strategies also discussed with patient included dietary changes of eating bland, low-fiber meals in the beginning when diarrhea risk is highest with Verzenio . Also discussed avoiding foods that may upset stomach including spicy, fried and greasy foods.  Nausea/Vomiting: Will ask MD to send in PRN ondansetron  for patient to have on hand for N/V  Reviewed with patient importance of keeping a medication schedule and plan for any missed doses. No barriers to medication adherence identified.  Medication reconciliation performed and medication/allergy list updated.  Distress thermometer flowsheet: Distress thermometer not completed during telephone call as patient has been on previous lines of therapy.   Communication and Learning Assessment Primary learner: Patient Barriers to learning: No barriers Preferred language: English Learning preferences: Listening Reading  All questions answered.  Ms. Castelo voiced understanding and appreciation.   Medication education  handout placed in mail for patient. Patient knows to call the office with questions or concerns. Oral Chemotherapy Clinic phone number provided to patient.   Asberry Macintosh, PharmD, BCPS, BCOP Hematology/Oncology Clinical Pharmacist [phone] 08/25/2024 1:47 PM

## 2024-08-25 NOTE — Telephone Encounter (Signed)
 Pending Dr. Jessy signature for Verzenio  PAP application

## 2024-08-25 NOTE — Telephone Encounter (Signed)
 Oral Oncology Patient Advocate Encounter  Prior Authorization for Verzenio  has been approved.    PA# [id number] Effective dates: 08/25/2024 through 08/25/2025  Patients co-pay is $1,854.98   I will follow up for copay assistance    Charlott Hamilton,  CPhT-Adv  she/her/hers [facility]  [facility] Specialty Pharmacy Services Pharmacy Technician Patient Advocate Specialist III [facility] Phone: [phone]  Fax: [phone] [email or web address]

## 2024-08-25 NOTE — Telephone Encounter (Signed)
 Oral Oncology Pharmacist Encounter  Received new prescription for Verzenio  (abemaciclib ) for the treatment of metastatic breast cancer, ER/PR positive, HER-2 negative in conjunction with letrozole , planned duration until disease progression or unacceptable drug toxicity.  Patient has most recently been on Kisqali , however patient is having medication access issues obtaining Kisqali  for her next cycle, which is scheduled to start on 08/28/24. The current anticipated time for patient to obtain Kisqali  at this time is a minimum of 2 weeks due to [employer] requiring patient to apply for medicare extra help. No samples of Kisqali  are available for patient, and patient has already utilized free trial offer for Kisqali . Dr. Timmy will change patient to Verzenio  at this time.   CBC w/ Diff and CMP from 08/05/24 assessed, patient with Scr of 1.71 mg/dL (CrCl ~63 mL/min) - no baseline dose adjustments for renal dysfunction are required for Verzenio . Prescription dose and frequency assessed for appropriateness.  Current medication list in Epic reviewed, no relevant/significant DDIs with Verzenio  identified.  Evaluated chart and no patient barriers to medication adherence noted.   Prescription has been e-scribed to the [facility] for benefits analysis and approval.  Oral Oncology Clinic will continue to follow for insurance authorization, copayment issues, initial counseling and start date.  Asberry Macintosh, PharmD, BCPS, BCOP Hematology/Oncology Clinical Pharmacist [phone] 08/25/2024 8:21 AM

## 2024-08-26 ENCOUNTER — Encounter: Payer: Self-pay | Admitting: Hematology & Oncology

## 2024-08-26 ENCOUNTER — Encounter: Payer: Self-pay | Admitting: Licensed Clinical Social Worker

## 2024-08-26 NOTE — Telephone Encounter (Signed)
 Oral Oncology Patient Advocate Encounter  Update: Patient's  PAP application for Verzenio  through [facility] has been faxed in with patient and Dr. Jessy signature.   Status: Pending Approval (patient has 4 weeks of sample meds)   I will continue to follow up     Charlott Hamilton,  CPhT-Adv  she/her/hers [facility]  [facility] Specialty Pharmacy Services Pharmacy Technician Patient Advocate Specialist III [facility] Phone: [phone]  Fax: [phone] [email or web address]

## 2024-08-28 NOTE — Telephone Encounter (Signed)
 Dr. Has changed Rx to Verzenio  at this time, no longer pursuing PAP for Kisqali 

## 2024-08-31 ENCOUNTER — Other Ambulatory Visit (HOSPITAL_BASED_OUTPATIENT_CLINIC_OR_DEPARTMENT_OTHER): Payer: Self-pay

## 2024-09-01 NOTE — Telephone Encounter (Signed)
 Application still in process for Verzenio , a determination should be made by Friday 09/04/2024.

## 2024-09-02 ENCOUNTER — Encounter: Payer: Self-pay | Admitting: Hematology & Oncology

## 2024-09-04 ENCOUNTER — Telehealth: Payer: Self-pay

## 2024-09-04 NOTE — Telephone Encounter (Signed)
 Oral Oncology Patient Advocate Encounter   Received notification that the application for assistance for Verzenio  through [facility] has been approved.      Effective dates: 09/04/2024 through 09/09/2025  Medication will be filled at [facility].    Charlott Hamilton,  CPhT-Adv  she/her/hers [facility] Health  [facility] Specialty Pharmacy Services Pharmacy Technician Patient Advocate Specialist III [facility] Phone: [phone]  Fax: [phone] [email or web address]

## 2024-09-04 NOTE — Telephone Encounter (Signed)
 PAP reenrollment for year 2026    Application has been submitted for Kisqali  through [employer] with both Patient and doctor signatures, along with requested documentation.  Status: Pending  - Patient extra help denial letter, Patient is aware. Due to patient having to apply for extra help and running out of Kisqali  before letter received and renewal approved Dr Timmy changed to Verzenio .

## 2024-09-08 ENCOUNTER — Encounter: Payer: Self-pay | Admitting: Hematology & Oncology

## 2024-09-08 ENCOUNTER — Ambulatory Visit (HOSPITAL_BASED_OUTPATIENT_CLINIC_OR_DEPARTMENT_OTHER)
Admission: RE | Admit: 2024-09-08 | Discharge: 2024-09-08 | Disposition: A | Discharge status: Home / Self Care | Payer: Medicare (Managed Care) | Source: Ambulatory Visit | Attending: Hematology & Oncology | Admitting: Hematology & Oncology

## 2024-09-08 ENCOUNTER — Inpatient Hospital Stay: Payer: Medicare (Managed Care)

## 2024-09-08 ENCOUNTER — Inpatient Hospital Stay (HOSPITAL_BASED_OUTPATIENT_CLINIC_OR_DEPARTMENT_OTHER): Payer: Medicare (Managed Care) | Admitting: Hematology & Oncology

## 2024-09-08 ENCOUNTER — Other Ambulatory Visit: Payer: Self-pay

## 2024-09-08 VITALS — BP 178/71 | HR 55 | Temp 99.0°F | Resp 16 | Ht 67.0 in | Wt 207.0 lb

## 2024-09-08 DIAGNOSIS — Z1721 Progesterone receptor positive status: Secondary | ICD-10-CM | POA: Diagnosis not present

## 2024-09-08 DIAGNOSIS — C7951 Secondary malignant neoplasm of bone: Secondary | ICD-10-CM | POA: Diagnosis not present

## 2024-09-08 DIAGNOSIS — Z1731 Human epidermal growth factor receptor 2 positive status: Secondary | ICD-10-CM | POA: Diagnosis not present

## 2024-09-08 DIAGNOSIS — Z170 Estrogen receptor positive status [ER+]: Secondary | ICD-10-CM | POA: Diagnosis not present

## 2024-09-08 DIAGNOSIS — C782 Secondary malignant neoplasm of pleura: Secondary | ICD-10-CM | POA: Diagnosis not present

## 2024-09-08 DIAGNOSIS — C50919 Malignant neoplasm of unspecified site of unspecified female breast: Secondary | ICD-10-CM | POA: Insufficient documentation

## 2024-09-08 DIAGNOSIS — J90 Pleural effusion, not elsewhere classified: Secondary | ICD-10-CM | POA: Diagnosis not present

## 2024-09-08 DIAGNOSIS — Z79811 Long term (current) use of aromatase inhibitors: Secondary | ICD-10-CM | POA: Diagnosis not present

## 2024-09-08 LAB — MAGNESIUM: Magnesium: 2.3 mg/dL (ref 1.7–2.4)

## 2024-09-08 LAB — CMP (CANCER CENTER ONLY)
ALT: 19 U/L (ref 0–44)
AST: 24 U/L (ref 15–41)
Albumin: 4.4 g/dL (ref 3.5–5.0)
Alkaline Phosphatase: 92 U/L (ref 38–126)
Anion gap: 10 (ref 5–15)
BUN: 10 mg/dL (ref 8–23)
CO2: 26 mmol/L (ref 22–32)
Calcium: 9.7 mg/dL (ref 8.9–10.3)
Chloride: 107 mmol/L (ref 98–111)
Creatinine: 1.43 mg/dL — ABNORMAL HIGH (ref 0.44–1.00)
GFR, Estimated: 39 mL/min — ABNORMAL LOW
Glucose, Bld: 88 mg/dL (ref 70–99)
Potassium: 4.1 mmol/L (ref 3.5–5.1)
Sodium: 143 mmol/L (ref 135–145)
Total Bilirubin: 0.4 mg/dL (ref 0.0–1.2)
Total Protein: 7.2 g/dL (ref 6.5–8.1)

## 2024-09-08 LAB — CBC WITH DIFFERENTIAL (CANCER CENTER ONLY)
Abs Immature Granulocytes: 0 K/uL (ref 0.00–0.07)
Basophils Absolute: 0.1 K/uL (ref 0.0–0.1)
Basophils Relative: 2 %
Eosinophils Absolute: 0.1 K/uL (ref 0.0–0.5)
Eosinophils Relative: 2 %
HCT: 34.3 % — ABNORMAL LOW (ref 36.0–46.0)
Hemoglobin: 11.5 g/dL — ABNORMAL LOW (ref 12.0–15.0)
Immature Granulocytes: 0 %
Lymphocytes Relative: 42 %
Lymphs Abs: 1.5 K/uL (ref 0.7–4.0)
MCH: 31.9 pg (ref 26.0–34.0)
MCHC: 33.5 g/dL (ref 30.0–36.0)
MCV: 95 fL (ref 80.0–100.0)
Monocytes Absolute: 0.4 K/uL (ref 0.1–1.0)
Monocytes Relative: 11 %
Neutro Abs: 1.5 K/uL — ABNORMAL LOW (ref 1.7–7.7)
Neutrophils Relative %: 43 %
Platelet Count: 390 K/uL (ref 150–400)
RBC: 3.61 MIL/uL — ABNORMAL LOW (ref 3.87–5.11)
RDW: 14.5 % (ref 11.5–15.5)
WBC Count: 3.5 K/uL — ABNORMAL LOW (ref 4.0–10.5)
nRBC: 0 % (ref 0.0–0.2)

## 2024-09-08 NOTE — Progress Notes (Signed)
 " Hematology and Oncology Follow Up Visit  Karina White [id number] 1956/08/07 68 y.o. 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  Tooth extraction on 02/20/2024     Interim History:  Karina White is back for follow-up.  We had to make a switch from the ribociclib .  Apparently, she had assistance program that had paid for the ribociclib .  This ran out.  We were able to get her on Verzenio .  She is on 150 mg p.o. twice daily.  She tolerated this about a month ago.  She is doing well with it.  She initially had low bit of diarrhea but the diarrhea seems to have improved.  She did have a chest x-ray today.  This did show that the right pleural effusion was slightly larger.  Again, I am not sure is why she keeps having this effusion.  Although the studies have been negative for malignancy.  We will have to see about another thoracentesis on her.  She has had no problems with pain.  She has had no problems with her appetite.  She had a wonderful Thanksgiving and wonderful Christmas.  Her last CA 27.29 was up a little bit.  It was up to I think 60.  We will have to watch this.  I think she is probably due for a PET scan.  Her last PET scan was done back in November.  She has had a little bit of fluid retention.  We took her off to hydrochlorothiazide  because of some kidney issues.  Her kidney issues are better.  She has had no bleeding.  Overall, I would say that her performance status is probably ECOG 1.    Medications:  Current Outpatient Medications:    abemaciclib  (VERZENIO ) 150 MG tablet, Take 1 tablet (150 mg total) by mouth 2 (two) times daily., Disp: 56 tablet, Rfl: 2   aspirin  EC 81 MG tablet, Take 81 mg by mouth  daily. 01/01/2024 States takes every other day., Disp: , Rfl:    carvedilol  (COREG ) 3.125 MG tablet, Take 1 tablet (3.125 mg total) by mouth 2 (two) times daily with a meal., Disp: 60 tablet, Rfl: 6   hydrochlorothiazide  (HYDRODIURIL ) 25 MG tablet, Take 1 tablet (25 mg total) by mouth daily. (Patient not taking: Reported on 09/08/2024), Disp: 30 tablet, Rfl: 6   letrozole  (FEMARA ) 2.5 MG tablet, Take 1 tablet (2.5 mg total) by mouth daily., Disp: 30 tablet, Rfl: 6   Multiple Vitamins-Minerals (CENTRUM ADULTS PO), Take 1 tablet by mouth daily., Disp: , Rfl:    Omega 3 1200 MG CAPS, Take 1,200 mg by mouth daily., Disp: , Rfl:    omeprazole (PRILOSEC) 10 MG capsule, Take 10 mg by mouth daily., Disp: , Rfl:    ondansetron  (ZOFRAN ) 8 MG tablet, Take 1 tablet (8 mg total) by mouth every 8 (eight) hours as needed for nausea or vomiting., Disp: 20 tablet, Rfl: 2   potassium chloride  SA (KLOR-CON  M) 20 MEQ tablet, Take 1 tablet (20 mEq total) by mouth every other day., Disp: 30 tablet, Rfl: 6   telmisartan  (MICARDIS ) 40 MG tablet, Take 1 tablet (40 mg total) by mouth daily., Disp: 90 tablet, Rfl: 1   VITAMIN D, CHOLECALCIFEROL , PO,  Take 2,000 Units by mouth daily., Disp: , Rfl:   Allergies:  Allergies  Allergen Reactions   Codeine Nausea And Vomiting   Penicillin G Hives and Itching   Penicillins Hives and Itching    Past Medical History, Surgical history, Social history, and Family History were reviewed and updated.  Review of Systems: Review of Systems  Constitutional: Negative.  Negative for appetite change and unexpected weight change.  HENT:  Negative.    Eyes: Negative.   Respiratory:  Positive for shortness of breath.   Gastrointestinal: Negative.   Endocrine: Negative.   Genitourinary: Negative.    Musculoskeletal:  Positive for back pain.  Skin: Negative.   Neurological: Negative.   Hematological: Negative.   Psychiatric/Behavioral: Negative.      Physical Exam:  Vital signs  show temperature of 99.  Pulse 55.  Blood pressure 178/71.  Weight is 207 pounds         Wt Readings from Last 3 Encounters:  09/08/24 207 lb (93.9 kg)  08/05/24 206 lb 6.4 oz (93.6 kg)  06/19/24 199 lb (90.3 kg)    Physical Exam Vitals reviewed.  Constitutional:      Comments: Her breast exam shows right breast with no masses, edema or erythema.  There is no right axillary adenopathy.  Left breast does not show any obvious mass.  There may be a little bit of firmness at the 12 o'clock position.  I cannot palpate any left axillary adenopathy.    HENT:     Head: Normocephalic and atraumatic.  Eyes:     Pupils: Pupils are equal, round, and reactive to light.  Cardiovascular:     Rate and Rhythm: Normal rate and regular rhythm.     Heart sounds: Normal heart sounds.  Pulmonary:     Effort: Pulmonary effort is normal.     Breath sounds: Normal breath sounds.  Abdominal:     General: Bowel sounds are normal.     Palpations: Abdomen is soft.     Comments: Abdominal exam is soft.  She has decent bowel sounds.  There is no fluid wave.  There is no palpable liver or spleen tip.  Musculoskeletal:        General: No tenderness or deformity. Normal range of motion.     Cervical back: Normal range of motion.     Comments: Back exam shows some slight tenderness to palpation over the thoracic and lumbar spine.  Lymphadenopathy:     Cervical: No cervical adenopathy.  Skin:    General: Skin is warm and dry.     Findings: No erythema or rash.  Neurological:     Mental Status: She is alert and oriented to person, place, and time.  Psychiatric:        Behavior: Behavior normal.        Thought Content: Thought content normal.        Judgment: Judgment normal.      Lab Results  Component Value Date   WBC 3.5 (L) 09/08/2024   HGB 11.5 (L) 09/08/2024   HCT 34.3 (L) 09/08/2024   MCV 95.0 09/08/2024   PLT 390 09/08/2024     Chemistry      Component Value Date/Time   NA 143 09/08/2024  0923   K 4.1 09/08/2024 0923   CL 107 09/08/2024 0923   CO2 26 09/08/2024 0923   BUN 10 09/08/2024 0923   CREATININE 1.43 (H) 09/08/2024 0923      Component Value Date/Time  CALCIUM 9.7 09/08/2024 0923   ALKPHOS 92 09/08/2024 0923   AST 24 09/08/2024 0923   ALT 19 09/08/2024 0923   BILITOT 0.4 09/08/2024 9076      Impression and Plan: Karina White is a very nice 68 year old Afro-American female.  She has stage IV adenocarcinoma of the breast.  Thankfully, her tumor is ER/PR positive.  It is low HER2 positive.  We currently have her on antiestrogen therapy.    Again, we need to do a PET scan on her.  I will see her back in 1 set up for her in about 3 weeks or so.  We will set up the thoracentesis for her.  Hopefully, we can get this done next week.  She is not symptomatic with the fluid.  It will be interesting to see what the CA 27.29 is.  I am glad that we are able to get her on the Verzenio .  She seems to be tolerating this pretty well.  I will plan to have her come back in a month.  Told her that if we find that everything looks little more active, then we probably will get her off the Femara  and switch over to Faslodex.  I think this would be a reasonable way to go with her.   Maude JONELLE Crease, MD 12/30/202510:12 AM "

## 2024-09-09 ENCOUNTER — Ambulatory Visit: Payer: Self-pay | Admitting: Hematology & Oncology

## 2024-09-09 LAB — CANCER ANTIGEN 27.29: CA 27.29: 54.7 U/mL — ABNORMAL HIGH (ref 0.0–38.6)

## 2024-09-09 NOTE — Telephone Encounter (Signed)
 Advised via MyChart.

## 2024-09-09 NOTE — Telephone Encounter (Signed)
-----   Message from [name], MD sent at 09/09/2024  7:51 AM EST ----- Call - the cancer level is down to 54!!!  Great news!!  Karina White

## 2024-09-14 ENCOUNTER — Ambulatory Visit (HOSPITAL_COMMUNITY)
Admission: RE | Admit: 2024-09-14 | Discharge: 2024-09-14 | Disposition: A | Discharge status: Home / Self Care | Payer: Medicare (Managed Care) | Source: Ambulatory Visit | Attending: Hematology & Oncology | Admitting: Hematology & Oncology

## 2024-09-14 ENCOUNTER — Ambulatory Visit (HOSPITAL_COMMUNITY)
Admission: RE | Admit: 2024-09-14 | Discharge: 2024-09-14 | Disposition: A | Discharge status: Home / Self Care | Payer: Medicare (Managed Care) | Source: Ambulatory Visit

## 2024-09-14 DIAGNOSIS — C7951 Secondary malignant neoplasm of bone: Secondary | ICD-10-CM | POA: Insufficient documentation

## 2024-09-14 DIAGNOSIS — J90 Pleural effusion, not elsewhere classified: Secondary | ICD-10-CM

## 2024-09-14 DIAGNOSIS — J910 Malignant pleural effusion: Secondary | ICD-10-CM | POA: Insufficient documentation

## 2024-09-14 DIAGNOSIS — C50919 Malignant neoplasm of unspecified site of unspecified female breast: Secondary | ICD-10-CM | POA: Diagnosis present

## 2024-09-14 HISTORY — PX: IR THORACENTESIS ASP PLEURAL SPACE W/IMG GUIDE: IMG5380

## 2024-09-14 MED ORDER — LIDOCAINE-EPINEPHRINE 1 %-1:100000 IJ SOLN
INTRAMUSCULAR | Status: AC
  Filled 2024-09-14: qty 20

## 2024-09-14 MED ORDER — LIDOCAINE-EPINEPHRINE 1 %-1:100000 IJ SOLN
20.0000 mL | Freq: Once | INTRAMUSCULAR | Status: AC
  Administered 2024-09-14: 10 mL via INTRADERMAL

## 2024-09-14 NOTE — Procedures (Signed)
 PROCEDURE SUMMARY:  Successful image-guided diagnostic and therapeutic thoracentesis from the right chest.  Yielded 850 mL of cloudy yellow fluid.  No immediate complications.  EBL: zero Patient tolerated well.   Specimen sent for labs.  Post-procedure CXR ordered and reviewed by Dr. Juliene Balder prior to departure from department.   Please see imaging section of Epic for full dictation.  Jaymari Cromie B Braylynn Lewing NP 09/14/2024 10:16 AM

## 2024-09-16 ENCOUNTER — Ambulatory Visit: Payer: Self-pay | Admitting: Hematology & Oncology

## 2024-09-16 ENCOUNTER — Telehealth: Payer: Self-pay | Admitting: Pharmacist

## 2024-09-16 DIAGNOSIS — C50919 Malignant neoplasm of unspecified site of unspecified female breast: Secondary | ICD-10-CM

## 2024-09-16 LAB — CYTOLOGY - NON PAP

## 2024-09-16 MED ORDER — ABEMACICLIB 150 MG PO TABS
150.0000 mg | ORAL_TABLET | Freq: Two times a day (BID) | ORAL | 2 refills | Status: AC
  Filled 2024-09-18: qty 56, 28d supply, fill #0
  Filled 2024-10-14: qty 56, 28d supply, fill #1

## 2024-09-16 NOTE — Telephone Encounter (Signed)
-----   Message from [name], MD sent at 09/16/2024  3:40 PM EST ----- Please call and let her know that there is no cancer cells in the fluid.  This is fantastic.  Jeralyn

## 2024-09-16 NOTE — Telephone Encounter (Signed)
 Oral Oncology Pharmacist Encounter  Patient approved for grant through [employer] to receive Verzenio  (or any other breast cancer therapy) at no cost for 2026.   Prescription has been redirected to [facility] at [facility] for dispensing. Patient will be discontinued from [facility].   Asberry Macintosh, PharmD, BCPS, BCOP Hematology/Oncology Clinical Pharmacist [phone] 09/16/2024 2:03 PM

## 2024-09-16 NOTE — Telephone Encounter (Signed)
 Advised via MyChart.

## 2024-09-18 ENCOUNTER — Telehealth: Payer: Self-pay

## 2024-09-18 ENCOUNTER — Other Ambulatory Visit (HOSPITAL_COMMUNITY): Payer: Self-pay

## 2024-09-18 ENCOUNTER — Other Ambulatory Visit: Payer: Self-pay

## 2024-09-18 NOTE — Progress Notes (Addendum)
 Specialty Pharmacy Initial Fill Coordination Note  Karina White is a 69 y.o. female contacted today regarding refills of specialty medication(s) Abemaciclib  (VERZENIO ) .  Patient requested Delivery  on 09/24/24  to verified address [address] [place] [address]   Medication will be filled on 09/23/2024.   Patient is aware of $0.00 copayment. Use grant on file *Please deliver to [facility]*

## 2024-09-18 NOTE — Telephone Encounter (Signed)
 Oral Oncology Patient Advocate Encounter  Patient assistance through [facility] for Verzenio  has been canceled via phone. Due to patient filling at [facility] now.      Charlott Hamilton,  CPhT-Adv  she/her/hers [facility]  [facility] Pharmacy Technician Patient Advocate Specialist III [facility] Phone: [phone]  Fax: [phone] [email or web address]

## 2024-09-18 NOTE — Progress Notes (Signed)
 Oral Chemotherapy Pharmacist Encounter  Patient was counseled under telephone encounter from 08/25/24. Patient is switching pharmacies to [facility] at [facility] as a grant was obtained to cover cost for Verzenio .   Asberry Macintosh, PharmD, BCPS, BCOP Hematology/Oncology Clinical Pharmacist [phone] 09/18/2024 12:55 PM

## 2024-09-18 NOTE — Telephone Encounter (Signed)
 Oral Oncology Patient Advocate Encounter   Was successful in securing patient a $53 grant from [employer] (PAF) to provide copayment coverage for Verzenio .  This will keep the out of pocket expense at $0.     The billing information is as follows and has been shared with [facility].   RxBin: [id number] PCN:  [id number] Member ID: [id number] Group ID: [id number] Dates of Eligibility: 09/16/2024 through 09/16/2025   Charlott Hamilton,  CPhT-Adv  she/her/hers [place]  [facility] Services Pharmacy Technician Patient Advocate Specialist III [facility] Phone: [phone]  Fax: [phone] [email or web address]

## 2024-09-21 ENCOUNTER — Other Ambulatory Visit (HOSPITAL_BASED_OUTPATIENT_CLINIC_OR_DEPARTMENT_OTHER): Payer: Self-pay

## 2024-09-23 ENCOUNTER — Other Ambulatory Visit: Payer: Self-pay

## 2024-09-28 ENCOUNTER — Encounter (HOSPITAL_COMMUNITY)
Admission: RE | Admit: 2024-09-28 | Discharge: 2024-09-28 | Disposition: A | Discharge status: Home / Self Care | Payer: Medicare (Managed Care) | Source: Ambulatory Visit | Attending: Hematology & Oncology | Admitting: Hematology & Oncology

## 2024-09-28 DIAGNOSIS — C7951 Secondary malignant neoplasm of bone: Secondary | ICD-10-CM | POA: Diagnosis present

## 2024-09-28 DIAGNOSIS — C50919 Malignant neoplasm of unspecified site of unspecified female breast: Secondary | ICD-10-CM | POA: Insufficient documentation

## 2024-09-28 LAB — GLUCOSE, CAPILLARY: Glucose-Capillary: 92 mg/dL (ref 70–99)

## 2024-09-28 MED ORDER — FLUDEOXYGLUCOSE F - 18 (FDG) INJECTION
10.1800 | Freq: Once | INTRAVENOUS | Status: AC
  Administered 2024-09-28: 10.18 via INTRAVENOUS

## 2024-09-29 ENCOUNTER — Other Ambulatory Visit (HOSPITAL_BASED_OUTPATIENT_CLINIC_OR_DEPARTMENT_OTHER): Payer: Self-pay

## 2024-09-29 ENCOUNTER — Other Ambulatory Visit: Payer: Self-pay | Admitting: Hematology & Oncology

## 2024-09-29 MED ORDER — LETROZOLE 2.5 MG PO TABS
2.5000 mg | ORAL_TABLET | Freq: Every day | ORAL | 6 refills | Status: AC
  Filled 2024-09-29: qty 30, 30d supply, fill #0

## 2024-10-01 ENCOUNTER — Inpatient Hospital Stay: Payer: Medicare (Managed Care) | Attending: Hematology & Oncology

## 2024-10-01 ENCOUNTER — Inpatient Hospital Stay: Payer: Medicare (Managed Care) | Attending: Hematology & Oncology | Admitting: Licensed Clinical Social Worker

## 2024-10-01 ENCOUNTER — Telehealth: Payer: Self-pay

## 2024-10-01 ENCOUNTER — Ambulatory Visit: Payer: Self-pay | Admitting: Hematology & Oncology

## 2024-10-01 DIAGNOSIS — C782 Secondary malignant neoplasm of pleura: Secondary | ICD-10-CM | POA: Insufficient documentation

## 2024-10-01 DIAGNOSIS — Z1721 Progesterone receptor positive status: Secondary | ICD-10-CM | POA: Insufficient documentation

## 2024-10-01 DIAGNOSIS — Z1731 Human epidermal growth factor receptor 2 positive status: Secondary | ICD-10-CM | POA: Insufficient documentation

## 2024-10-01 DIAGNOSIS — C50919 Malignant neoplasm of unspecified site of unspecified female breast: Secondary | ICD-10-CM | POA: Insufficient documentation

## 2024-10-01 DIAGNOSIS — Z170 Estrogen receptor positive status [ER+]: Secondary | ICD-10-CM | POA: Insufficient documentation

## 2024-10-01 DIAGNOSIS — C7951 Secondary malignant neoplasm of bone: Secondary | ICD-10-CM | POA: Insufficient documentation

## 2024-10-01 DIAGNOSIS — Z79811 Long term (current) use of aromatase inhibitors: Secondary | ICD-10-CM | POA: Insufficient documentation

## 2024-10-01 NOTE — Progress Notes (Signed)
 [facility] CSW Progress Note  Clinical Child Psychotherapist contacted patient by phone to follow-up on need for community resources. Patient left VM for Macario Au, CSW (now retired) on 09/29/24 and Intern returned call on 10/01/24. Patient has already used [employer], Engineer, [employer] in [place], [employer], and does not qualify for [employer] assistance program. Patient reports she has large outstanding energy bill and is looking for resources to help pay for it.     Interventions: Referred patient to community resources: [employer] emergency financial assistance        Follow Up Plan:  CSW will follow-up with patient by phone     Thersia KATHEE Daring Clinical Social Worker [facility]

## 2024-10-01 NOTE — Telephone Encounter (Signed)
 Call received from patient requesting a call back to discuss paperwork. RN placed return call and spoke with patient. Patient is requesting documentation confirming she is on active treatment to provide to [employer] in [place] who provides her assistance. Patient unable to send via MyChart. RN reached out to [employer] in [place] and asked them to fax over the form they needed completed. Discussed with patient that if we do not receive the form from them, we will help her complete the form she has when she comes for her appointment on 10/08/24. Patient verbalized understanding and very appreciative for the help.

## 2024-10-02 ENCOUNTER — Telehealth: Payer: Self-pay

## 2024-10-02 NOTE — Progress Notes (Signed)
 [facility] CSW Progress Note  Clinical Child Psychotherapist contacted patient by phone to follow-up on financial concerns.    Interventions: Pt reports she has a Risk Analyst for [phone] that is due February 7th and she is unable to pay it.  Pt received $400 from Cancer Services in April and $100 from [employer] in December towards her energy bill.  CSW discussed the need for a long term solution at length with pt as she has exhausted all other grant options.  Pt verbalized agreement to apply for the [employer] assistance program.  Pt to apply and will contact CSW after applying to inform if financial assistance for the current bill is still needed.        Follow Up Plan:  Patient will contact CSW with any support or resource needs    Devere JONELLE Manna, LCSW Clinical Social Worker [facility]

## 2024-10-02 NOTE — Telephone Encounter (Signed)
 Received phone call from patient asking clarifying questions about her recent scan results. Pt very concerned about the fluid in her lungs always coming back Pt reassured that her recent scan did not show any active cancer cells. Pt educated that Dr. Timmy will talk about next steps and plan of care at her appointment next week. Pt verbalized understanding and had no further questions.

## 2024-10-06 ENCOUNTER — Other Ambulatory Visit (HOSPITAL_BASED_OUTPATIENT_CLINIC_OR_DEPARTMENT_OTHER): Payer: Self-pay

## 2024-10-06 ENCOUNTER — Other Ambulatory Visit: Payer: Self-pay

## 2024-10-06 MED ORDER — FLUTICASONE PROPIONATE 50 MCG/ACT NA SUSP
NASAL | 1 refills | Status: AC
  Filled 2024-10-06: qty 16, 30d supply, fill #0

## 2024-10-06 MED ORDER — ALPRAZOLAM 0.25 MG PO TABS
0.2500 mg | ORAL_TABLET | Freq: Three times a day (TID) | ORAL | 0 refills | Status: AC | PRN
  Filled 2024-10-06: qty 30, 10d supply, fill #0

## 2024-10-07 ENCOUNTER — Other Ambulatory Visit (HOSPITAL_BASED_OUTPATIENT_CLINIC_OR_DEPARTMENT_OTHER): Payer: Self-pay

## 2024-10-08 ENCOUNTER — Inpatient Hospital Stay: Payer: Medicare (Managed Care)

## 2024-10-08 ENCOUNTER — Other Ambulatory Visit: Payer: Self-pay

## 2024-10-08 ENCOUNTER — Inpatient Hospital Stay: Payer: Medicare (Managed Care) | Admitting: Hematology & Oncology

## 2024-10-08 ENCOUNTER — Encounter: Payer: Self-pay | Admitting: Hematology & Oncology

## 2024-10-08 VITALS — BP 191/70 | HR 62 | Temp 98.5°F | Resp 19 | Ht 67.0 in | Wt 207.0 lb

## 2024-10-08 DIAGNOSIS — C50919 Malignant neoplasm of unspecified site of unspecified female breast: Secondary | ICD-10-CM

## 2024-10-08 DIAGNOSIS — C7951 Secondary malignant neoplasm of bone: Secondary | ICD-10-CM | POA: Diagnosis present

## 2024-10-08 DIAGNOSIS — Z79811 Long term (current) use of aromatase inhibitors: Secondary | ICD-10-CM | POA: Diagnosis not present

## 2024-10-08 DIAGNOSIS — Z1721 Progesterone receptor positive status: Secondary | ICD-10-CM | POA: Diagnosis not present

## 2024-10-08 DIAGNOSIS — Z170 Estrogen receptor positive status [ER+]: Secondary | ICD-10-CM | POA: Diagnosis not present

## 2024-10-08 DIAGNOSIS — C782 Secondary malignant neoplasm of pleura: Secondary | ICD-10-CM | POA: Diagnosis not present

## 2024-10-08 DIAGNOSIS — Z1731 Human epidermal growth factor receptor 2 positive status: Secondary | ICD-10-CM | POA: Diagnosis not present

## 2024-10-08 LAB — CBC WITH DIFFERENTIAL (CANCER CENTER ONLY)
Abs Immature Granulocytes: 0.01 10*3/uL (ref 0.00–0.07)
Basophils Absolute: 0 10*3/uL (ref 0.0–0.1)
Basophils Relative: 1 %
Eosinophils Absolute: 0.1 10*3/uL (ref 0.0–0.5)
Eosinophils Relative: 2 %
HCT: 32.8 % — ABNORMAL LOW (ref 36.0–46.0)
Hemoglobin: 10.9 g/dL — ABNORMAL LOW (ref 12.0–15.0)
Immature Granulocytes: 0 %
Lymphocytes Relative: 46 %
Lymphs Abs: 1.6 10*3/uL (ref 0.7–4.0)
MCH: 31.3 pg (ref 26.0–34.0)
MCHC: 33.2 g/dL (ref 30.0–36.0)
MCV: 94.3 fL (ref 80.0–100.0)
Monocytes Absolute: 0.3 10*3/uL (ref 0.1–1.0)
Monocytes Relative: 8 %
Neutro Abs: 1.5 10*3/uL — ABNORMAL LOW (ref 1.7–7.7)
Neutrophils Relative %: 43 %
Platelet Count: 188 10*3/uL (ref 150–400)
RBC: 3.48 MIL/uL — ABNORMAL LOW (ref 3.87–5.11)
RDW: 14.2 % (ref 11.5–15.5)
WBC Count: 3.6 10*3/uL — ABNORMAL LOW (ref 4.0–10.5)
nRBC: 0 % (ref 0.0–0.2)

## 2024-10-08 LAB — CMP (CANCER CENTER ONLY)
ALT: 16 U/L (ref 0–44)
AST: 25 U/L (ref 15–41)
Albumin: 4.3 g/dL (ref 3.5–5.0)
Alkaline Phosphatase: 79 U/L (ref 38–126)
Anion gap: 9 (ref 5–15)
BUN: 9 mg/dL (ref 8–23)
CO2: 28 mmol/L (ref 22–32)
Calcium: 9.5 mg/dL (ref 8.9–10.3)
Chloride: 108 mmol/L (ref 98–111)
Creatinine: 1.4 mg/dL — ABNORMAL HIGH (ref 0.44–1.00)
GFR, Estimated: 40 mL/min — ABNORMAL LOW
Glucose, Bld: 89 mg/dL (ref 70–99)
Potassium: 4.1 mmol/L (ref 3.5–5.1)
Sodium: 145 mmol/L (ref 135–145)
Total Bilirubin: 0.4 mg/dL (ref 0.0–1.2)
Total Protein: 6.9 g/dL (ref 6.5–8.1)

## 2024-10-08 LAB — LACTATE DEHYDROGENASE: LDH: 286 U/L — ABNORMAL HIGH (ref 105–235)

## 2024-10-08 MED ORDER — DENOSUMAB 120 MG/1.7ML ~~LOC~~ SOLN
120.0000 mg | Freq: Once | SUBCUTANEOUS | Status: AC
  Administered 2024-10-08: 120 mg via SUBCUTANEOUS
  Filled 2024-10-08: qty 1.7

## 2024-10-08 NOTE — Patient Instructions (Signed)
 Denosumab  Injection (Oncology) What is this medication? DENOSUMAB  (den oh SUE mab) prevents weakened bones caused by cancer. It may also be used to treat noncancerous bone tumors that cannot be removed by surgery. It can also be used to treat high calcium  levels in the blood caused by cancer. It works by blocking a protein that causes bones to break down quickly. This slows down the release of calcium  from bones, which lowers calcium  levels in your blood. It also makes your bones stronger and less likely to break (fracture). This medicine may be used for other purposes; ask your health care provider or pharmacist if you have questions. COMMON BRAND NAME(S): XGEVA  What should I tell my care team before I take this medication? They need to know if you have any of these conditions: Dental disease Having surgery or tooth extraction Infection Kidney disease Low levels of calcium  or vitamin D  in the blood Malnutrition On hemodialysis Skin conditions or sensitivity Thyroid  or parathyroid disease An unusual reaction to denosumab , other medications, foods, dyes, or preservatives Pregnant or trying to get pregnant Breast-feeding How should I use this medication? This medication is for injection under the skin. It is given by your care team in a hospital or clinic setting. A special MedGuide will be given to you before each treatment. Be sure to read this information carefully each time. Talk to your care team about the use of this medication in children. While it may be prescribed for children as young as 13 years for selected conditions, precautions do apply. Overdosage: If you think you have taken too much of this medicine contact a poison control center or emergency room at once. NOTE: This medicine is only for you. Do not share this medicine with others. What if I miss a dose? Keep appointments for follow-up doses. It is important not to miss your dose. Call your care team if you are unable to  keep an appointment. What may interact with this medication? Do not take this medication with any of the following: Other medications containing denosumab  This medication may also interact with the following: Medications that lower your chance of fighting infection Steroid medications, such as prednisone  or cortisone This list may not describe all possible interactions. Give your health care provider a list of all the medicines, herbs, non-prescription drugs, or dietary supplements you use. Also tell them if you smoke, drink alcohol, or use illegal drugs. Some items may interact with your medicine. What should I watch for while using this medication? Your condition will be monitored carefully while you are receiving this medication. You may need blood work while taking this medication. This medication may increase your risk of getting an infection. Call your care team for advice if you get a fever, chills, sore throat, or other symptoms of a cold or flu. Do not treat yourself. Try to avoid being around people who are sick. You should make sure you get enough calcium  and vitamin D  while you are taking this medication, unless your care team tells you not to. Discuss the foods you eat and the vitamins you take with your care team. Some people who take this medication have severe bone, joint, or muscle pain. This medication may also increase your risk for jaw problems or a broken thigh bone. Tell your care team right away if you have severe pain in your jaw, bones, joints, or muscles. Tell your care team if you have any pain that does not go away or that gets worse. Talk  to your care team if you may be pregnant. Serious birth defects can occur if you take this medication during pregnancy and for 5 months after the last dose. You will need a negative pregnancy test before starting this medication. Contraception is recommended while taking this medication and for 5 months after the last dose. Your care team  can help you find the option that works for you. What side effects may I notice from receiving this medication? Side effects that you should report to your care team as soon as possible: Allergic reactions--skin rash, itching, hives, swelling of the face, lips, tongue, or throat Bone, joint, or muscle pain Low calcium  level--muscle pain or cramps, confusion, tingling, or numbness in the hands or feet Osteonecrosis of the jaw--pain, swelling, or redness in the mouth, numbness of the jaw, poor healing after dental work, unusual discharge from the mouth, visible bones in the mouth Side effects that usually do not require medical attention (report to your care team if they continue or are bothersome): Cough Diarrhea Fatigue Headache Nausea This list may not describe all possible side effects. Call your doctor for medical advice about side effects. You may report side effects to FDA at 1-800-FDA-1088. Where should I keep my medication? This medication is given in a hospital or clinic. It will not be stored at home. NOTE: This sheet is a summary. It may not cover all possible information. If you have questions about this medicine, talk to your doctor, pharmacist, or health care provider.  2024 Elsevier/Gold Standard (2022-01-17 00:00:00)

## 2024-10-08 NOTE — Progress Notes (Signed)
 " Hematology and Oncology Follow Up Visit  Karina White [id number] 04-Jun-1956 69 y.o. 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  Tooth extraction on 02/20/2024     Interim History:  [name] is back for follow-up.  She is now on Verzenio .  Apparently, there is no issues with the assistance program for the ribociclib .  She is doing well on the Verzenio .  She still has the recurrent pleural effusion on the right side.  Again, I want to try to get rid of this fluid if possible.  I talked with thoracic surgery.  Today I feel that this is something that may be pulmonary can handle.  As such, we will have to see about getting Pulmonary Medicine involved.  She had a PET scan that was done on 09/28/2024.  The PET scans not show any evidence of active metastatic disease.  Her last CA 27.29 was holding steady at 54.  She has had a good appetite.  She has had no problems with the bad weather.  She will stay inside this upcoming weekend when it is very cold.  She has had no shortness of breath.  She had a little bit of a cough..  She had a flu shot a few days ago.  She does have some arthralgias and myalgias right now.  There is been no leg swelling.  Her blood pressure is on the high side.  She said when she checked a few days ago and everything was normal at her doctor's office.  Overall, I would have to say that her performance status is probably ECOG 1.   Medications:  Current Outpatient Medications:    abemaciclib  (VERZENIO ) 150 MG tablet, Take 1 tablet (150 mg total) by mouth 2 (two) times daily., Disp: 56 tablet, Rfl: 2   ALPRAZolam  (XANAX ) 0.25 MG tablet, Take one tablet (0.25 mg dose) by mouth 3 (three) times a day  as needed for Anxiety. Max Daily Amount: 0.75 mg, Disp: 30 tablet, Rfl: 0   aspirin  EC 81 MG tablet, Take 81 mg by mouth daily. 01/01/2024 States takes every other day., Disp: , Rfl:    carvedilol  (COREG ) 3.125 MG tablet, Take 1 tablet (3.125 mg total) by mouth 2 (two) times daily with a meal., Disp: 60 tablet, Rfl: 6   fluticasone  (FLONASE ) 50 MCG/ACT nasal spray, Use one spray by nasal route daily., Disp: 16 g, Rfl: 1   hydrochlorothiazide  (HYDRODIURIL ) 25 MG tablet, Take 1 tablet (25 mg total) by mouth daily. (Patient not taking: Reported on 09/08/2024), Disp: 30 tablet, Rfl: 6   letrozole  (FEMARA ) 2.5 MG tablet, Take 1 tablet (2.5 mg total) by mouth daily., Disp: 30 tablet, Rfl: 6   Multiple Vitamins-Minerals (CENTRUM ADULTS PO), Take 1 tablet by mouth daily., Disp: , Rfl:    Omega 3 1200 MG CAPS, Take 1,200 mg by mouth daily., Disp: , Rfl:    omeprazole (PRILOSEC) 10 MG capsule, Take 10 mg by mouth daily., Disp: , Rfl:    ondansetron  (ZOFRAN ) 8 MG tablet, Take 1 tablet (8 mg total) by mouth every 8 (eight) hours as needed for nausea or vomiting., Disp: 20 tablet, Rfl: 2   potassium chloride  SA (KLOR-CON  M) 20 MEQ tablet, Take  1 tablet (20 mEq total) by mouth every other day., Disp: 30 tablet, Rfl: 6   telmisartan  (MICARDIS ) 40 MG tablet, Take 1 tablet (40 mg total) by mouth daily., Disp: 90 tablet, Rfl: 1   VITAMIN D, CHOLECALCIFEROL , PO, Take 2,000 Units by mouth daily., Disp: , Rfl:   Allergies:  Allergies  Allergen Reactions   Codeine Nausea And Vomiting   Penicillin G Hives and Itching   Penicillins Hives and Itching    Past Medical History, Surgical history, Social history, and Family History were reviewed and updated.  Review of Systems: Review of Systems  Constitutional: Negative.  Negative for appetite change and unexpected weight change.  HENT:  Negative.    Eyes: Negative.   Respiratory:  Positive for shortness of breath.   Gastrointestinal: Negative.   Endocrine:  Negative.   Genitourinary: Negative.    Musculoskeletal:  Positive for back pain.  Skin: Negative.   Neurological: Negative.   Hematological: Negative.   Psychiatric/Behavioral: Negative.      Physical Exam:  Vital signs show temperature of 98.5.  Pulse 62.  Blood pressure 176/79.  Weight is 207 pounds        Wt Readings from Last 3 Encounters:  10/08/24 207 lb (93.9 kg)  09/08/24 207 lb (93.9 kg)  08/05/24 206 lb 6.4 oz (93.6 kg)    Physical Exam Vitals reviewed.  Constitutional:      Comments: Her breast exam shows right breast with no masses, edema or erythema.  There is no right axillary adenopathy.  Left breast does not show any obvious mass.  There may be a little bit of firmness at the 12 o'clock position.  I cannot palpate any left axillary adenopathy.    HENT:     Head: Normocephalic and atraumatic.  Eyes:     Pupils: Pupils are equal, round, and reactive to light.  Cardiovascular:     Rate and Rhythm: Normal rate and regular rhythm.     Heart sounds: Normal heart sounds.  Pulmonary:     Effort: Pulmonary effort is normal.     Breath sounds: Normal breath sounds.  Abdominal:     General: Bowel sounds are normal.     Palpations: Abdomen is soft.     Comments: Abdominal exam is soft.  She has decent bowel sounds.  There is no fluid wave.  There is no palpable liver or spleen tip.  Musculoskeletal:        General: No tenderness or deformity. Normal range of motion.     Cervical back: Normal range of motion.     Comments: Back exam shows some slight tenderness to palpation over the thoracic and lumbar spine.  Lymphadenopathy:     Cervical: No cervical adenopathy.  Skin:    General: Skin is warm and dry.     Findings: No erythema or rash.  Neurological:     Mental Status: She is alert and oriented to person, place, and time.  Psychiatric:        Behavior: Behavior normal.        Thought Content: Thought content normal.        Judgment: Judgment normal.       Lab Results  Component Value Date   WBC 3.6 (L) 10/08/2024   HGB 10.9 (L) 10/08/2024   HCT 32.8 (L) 10/08/2024   MCV 94.3 10/08/2024   PLT 188 10/08/2024     Chemistry      Component Value Date/Time   NA 143 09/08/2024 0923  K 4.1 09/08/2024 0923   CL 107 09/08/2024 0923   CO2 26 09/08/2024 0923   BUN 10 09/08/2024 0923   CREATININE 1.43 (H) 09/08/2024 0923      Component Value Date/Time   CALCIUM 9.7 09/08/2024 0923   ALKPHOS 92 09/08/2024 0923   AST 24 09/08/2024 0923   ALT 19 09/08/2024 0923   BILITOT 0.4 09/08/2024 0923      Impression and Plan: Ms. Grossberg is a very nice 69 year old Afro-American female.  She has stage IV adenocarcinoma of the breast.  Thankfully, her tumor is ER/PR positive.  It is low HER2 positive.  We currently have her on antiestrogen therapy.    I do not think we need another PET scan prior until April or so.  Again we will have to see about Pulmonary Medicine to help out with this recurrent pleural effusion.  We have voiced tested it and it has been negative for malignancy.  We will see what her metabolic panel shows.  Will have to watch her renal function.  We will plan to get her back in another 6 weeks or so.  She will get her Xgeva  today.   Maude JONELLE Crease, MD 1/29/20268:46 AM "

## 2024-10-09 LAB — CANCER ANTIGEN 27.29: CA 27.29: 62.1 U/mL — ABNORMAL HIGH (ref 0.0–38.6)

## 2024-10-14 ENCOUNTER — Other Ambulatory Visit: Payer: Self-pay

## 2024-11-12 ENCOUNTER — Inpatient Hospital Stay: Payer: Medicare (Managed Care) | Admitting: Hematology & Oncology

## 2024-11-12 ENCOUNTER — Inpatient Hospital Stay: Payer: Medicare (Managed Care)
